# Patient Record
Sex: Female | Born: 1965 | Race: White | Hispanic: No | Smoking: Current every day smoker
Health system: Southern US, Community
[De-identification: ages and names within clinical notes are randomized; demographics above are authoritative.]

## PROBLEM LIST (undated history)

## (undated) ENCOUNTER — Ambulatory Visit: Admission: EM | Payer: Commercial Managed Care - HMO | Source: Home / Self Care

## (undated) DIAGNOSIS — B182 Chronic viral hepatitis C: Secondary | ICD-10-CM

## (undated) DIAGNOSIS — L0390 Cellulitis, unspecified: Secondary | ICD-10-CM

## (undated) DIAGNOSIS — F32A Depression, unspecified: Secondary | ICD-10-CM

## (undated) DIAGNOSIS — B9562 Methicillin resistant Staphylococcus aureus infection as the cause of diseases classified elsewhere: Secondary | ICD-10-CM

## (undated) DIAGNOSIS — F419 Anxiety disorder, unspecified: Secondary | ICD-10-CM

## (undated) DIAGNOSIS — R011 Cardiac murmur, unspecified: Secondary | ICD-10-CM

## (undated) HISTORY — PX: OTHER SURGICAL HISTORY: SHX169

## (undated) HISTORY — DX: Cardiac murmur, unspecified: R01.1

## (undated) HISTORY — DX: Anxiety disorder, unspecified: F41.9

## (undated) HISTORY — DX: Depression, unspecified: F32.A

## (undated) HISTORY — PX: CHOLECYSTECTOMY: SHX55

---

## 2018-04-08 ENCOUNTER — Emergency Department (HOSPITAL_COMMUNITY): Payer: Self-pay

## 2018-04-08 ENCOUNTER — Emergency Department (HOSPITAL_COMMUNITY)
Admission: EM | Admit: 2018-04-08 | Discharge: 2018-04-08 | Disposition: A | Discharge status: Home / Self Care | Payer: Self-pay | Attending: Emergency Medicine | Admitting: Emergency Medicine

## 2018-04-08 ENCOUNTER — Encounter (HOSPITAL_COMMUNITY): Payer: Self-pay | Admitting: Emergency Medicine

## 2018-04-08 ENCOUNTER — Other Ambulatory Visit: Payer: Self-pay

## 2018-04-08 DIAGNOSIS — R1084 Generalized abdominal pain: Secondary | ICD-10-CM | POA: Insufficient documentation

## 2018-04-08 DIAGNOSIS — F17210 Nicotine dependence, cigarettes, uncomplicated: Secondary | ICD-10-CM | POA: Insufficient documentation

## 2018-04-08 HISTORY — DX: Chronic viral hepatitis C: B18.2

## 2018-04-08 HISTORY — DX: Cellulitis, unspecified: L03.90

## 2018-04-08 LAB — URINALYSIS, ROUTINE W REFLEX MICROSCOPIC
Bilirubin Urine: NEGATIVE
Glucose, UA: NEGATIVE mg/dL
Hgb urine dipstick: NEGATIVE
KETONES UR: NEGATIVE mg/dL
Leukocytes,Ua: NEGATIVE
Nitrite: NEGATIVE
Protein, ur: NEGATIVE mg/dL
Specific Gravity, Urine: 1.011 (ref 1.005–1.030)
pH: 6 (ref 5.0–8.0)

## 2018-04-08 LAB — CBC WITH DIFFERENTIAL/PLATELET
Abs Immature Granulocytes: 0.01 10*3/uL (ref 0.00–0.07)
Basophils Absolute: 0 10*3/uL (ref 0.0–0.1)
Basophils Relative: 1 %
Eosinophils Absolute: 0.1 10*3/uL (ref 0.0–0.5)
Eosinophils Relative: 2 %
HCT: 40.9 % (ref 36.0–46.0)
Hemoglobin: 12.7 g/dL (ref 12.0–15.0)
Immature Granulocytes: 0 %
Lymphocytes Relative: 30 %
Lymphs Abs: 1.4 10*3/uL (ref 0.7–4.0)
MCH: 30.2 pg (ref 26.0–34.0)
MCHC: 31.1 g/dL (ref 30.0–36.0)
MCV: 97.1 fL (ref 80.0–100.0)
Monocytes Absolute: 0.4 10*3/uL (ref 0.1–1.0)
Monocytes Relative: 9 %
NRBC: 0 % (ref 0.0–0.2)
Neutro Abs: 2.8 10*3/uL (ref 1.7–7.7)
Neutrophils Relative %: 58 %
Platelets: 251 10*3/uL (ref 150–400)
RBC: 4.21 MIL/uL (ref 3.87–5.11)
RDW: 13 % (ref 11.5–15.5)
WBC: 4.7 10*3/uL (ref 4.0–10.5)

## 2018-04-08 LAB — COMPREHENSIVE METABOLIC PANEL
ALK PHOS: 82 U/L (ref 38–126)
ALT: 24 U/L (ref 0–44)
AST: 21 U/L (ref 15–41)
Albumin: 4.1 g/dL (ref 3.5–5.0)
Anion gap: 8 (ref 5–15)
BUN: 14 mg/dL (ref 6–20)
CO2: 26 mmol/L (ref 22–32)
Calcium: 9.1 mg/dL (ref 8.9–10.3)
Chloride: 105 mmol/L (ref 98–111)
Creatinine, Ser: 0.92 mg/dL (ref 0.44–1.00)
GFR calc Af Amer: >60 mL/min (ref >60)
GFR calc non Af Amer: >60 mL/min (ref >60)
Glucose, Bld: 90 mg/dL (ref 70–99)
Potassium: 4 mmol/L (ref 3.5–5.1)
Sodium: 139 mmol/L (ref 135–145)
TOTAL PROTEIN: 8.3 g/dL — AB (ref 6.5–8.1)
Total Bilirubin: 0.3 mg/dL (ref 0.3–1.2)

## 2018-04-08 LAB — LIPASE, BLOOD: Lipase: 46 U/L (ref 11–51)

## 2018-04-08 LAB — POC URINE PREG, ED: Preg Test, Ur: NEGATIVE

## 2018-04-08 MED ORDER — IOHEXOL 300 MG/ML  SOLN
100.0000 mL | Freq: Once | INTRAMUSCULAR | Status: AC | PRN
  Administered 2018-04-08: 100 mL via INTRAVENOUS

## 2018-04-08 NOTE — ED Triage Notes (Signed)
Pt states that she has a knot in her stomach it has been there for a few months she states that it is moving around.

## 2018-04-08 NOTE — Discharge Instructions (Addendum)
Take your usual prescriptions as previously directed.  Apply moist heat or ice to the area(s) of discomfort, for 15 minutes at a time, several times per day for the next few days.  Do not fall asleep on a heating or ice pack.  Call your regular medical doctor and the GI doctor on Monday to schedule a follow up appointment within the next few weeks.  Return to the Emergency Department immediately if worsening.

## 2018-04-08 NOTE — ED Provider Notes (Signed)
[facility] EMERGENCY DEPARTMENT Provider Note   CSN: [id number] Arrival date & time: 04/08/18  1436    History   Chief Complaint Chief Complaint  Patient presents with  . Abdominal Pain    HPI Alexandra Floyd is a 53 y.o. female.     HPI Pt was seen at 1535.  Per pt, c/o gradual onset and persistence of constant generalized abd "pain" for the past 6 months. Pt states she "feels a lump that moves all around my abd." Pt significant other states "it's like she's pregnant."  Describes the abd pain as "cramping."  Denies any associated symptoms. Denies N/V/D, no injury, no fevers, no back pain, no rash, no CP/SOB, no black or blood in stools, no dysuria/hematuria.       Past Medical History:  Diagnosis Date  . Cellulitis   . Hep C w/ coma, chronic (HCC)     There are no active problems to display for this patient.      OB History   No obstetric history on file.      Home Medications    Prior to Admission medications   Not on File    Family History No family history on file.  Social History Social History   Tobacco Use  . Smoking status: Current Every Day Smoker    Packs/day: 0.50    Types: Cigarettes  . Smokeless tobacco: Never Used  Substance Use Topics  . Alcohol use: Yes    Comment: social  . Drug use: Not on file     Allergies   Lasix [furosemide]   Review of Systems Review of Systems ROS: Statement: All systems negative except as marked or noted in the HPI; Constitutional: Negative for fever and chills. ; ; Eyes: Negative for eye pain, redness and discharge. ; ; ENMT: Negative for ear pain, hoarseness, nasal congestion, sinus pressure and sore throat. ; ; Cardiovascular: Negative for chest pain, palpitations, diaphoresis, dyspnea and peripheral edema. ; ; Respiratory: Negative for cough, wheezing and stridor. ; ; Gastrointestinal: +"knot moving around my abd."  Negative for nausea, vomiting, diarrhea, abdominal pain, blood in stool, hematemesis,  jaundice and rectal bleeding. . ; ; Genitourinary: Negative for dysuria, flank pain and hematuria. ; ; Musculoskeletal: Negative for back pain and neck pain. Negative for swelling and trauma.; ; Skin: Negative for pruritus, rash, abrasions, blisters, bruising and skin lesion.; ; Neuro: Negative for headache, lightheadedness and neck stiffness. Negative for weakness, altered level of consciousness, altered mental status, extremity weakness, paresthesias, involuntary movement, seizure and syncope.       Physical Exam Updated Vital Signs BP 126/90 (BP Location: Left Arm)   Pulse 81   Temp (!) 97.5 F (36.4 C) (Oral)   Resp 16   Ht 5\' 3"  (1.6 m)   Wt 71.2 kg   SpO2 100%   BMI 27.81 kg/m   Physical Exam 1540: Physical examination:  Nursing notes reviewed; Vital signs and O2 SAT reviewed;  Constitutional: Well developed, Well nourished, Well hydrated, In no acute distress; Head:  Normocephalic, atraumatic; Eyes: EOMI, PERRL, No scleral icterus; ENMT: Mouth and pharynx normal, Mucous membranes moist; Neck: Supple, Full range of motion, No lymphadenopathy; Cardiovascular: Regular rate and rhythm, No gallop; Respiratory: Breath sounds clear & equal bilaterally, No wheezes.  Speaking full sentences with ease, Normal respiratory effort/excursion; Chest: Nontender, Movement normal; Abdomen: Soft, Nontender, Nondistended, Normal bowel sounds; Genitourinary: No CVA tenderness; Extremities: Peripheral pulses normal, No tenderness, No edema, No calf edema or asymmetry.; Neuro: AA&Ox3,  Major CN grossly intact.  Speech clear. No gross focal motor or sensory deficits in extremities. Climbs on and off stretcher easily by herself. Gait steady..; Skin: Color normal, Warm, Dry.     ED Treatments / Results  Labs (all labs ordered are listed, but only abnormal results are displayed)   EKG None  Radiology   Procedures Procedures (including critical care time)  Medications Ordered in ED Medications - No  data to display   Initial Impression / Assessment and Plan / ED Course  I have reviewed the triage vital signs and the nursing notes.  Pertinent labs & imaging results that were available during my care of the patient were reviewed by me and considered in my medical decision making (see chart for details).     MDM Reviewed: nursing note and vitals Interpretation: labs, x-ray and CT scan    Results for orders placed or performed during the hospital encounter of 04/08/18  Comprehensive metabolic panel  Result Value Ref Range   Sodium 139 135 - 145 mmol/L   Potassium 4.0 3.5 - 5.1 mmol/L   Chloride 105 98 - 111 mmol/L   CO2 26 22 - 32 mmol/L   Glucose, Bld 90 70 - 99 mg/dL   BUN 14 6 - 20 mg/dL   Creatinine, Ser 1.95 0.44 - 1.00 mg/dL   Calcium 9.1 8.9 - 09.3 mg/dL   Total Protein 8.3 (H) 6.5 - 8.1 g/dL   Albumin 4.1 3.5 - 5.0 g/dL   AST 21 15 - 41 U/L   ALT 24 0 - 44 U/L   Alkaline Phosphatase 82 38 - 126 U/L   Total Bilirubin 0.3 0.3 - 1.2 mg/dL   GFR calc non Af Amer >60 >60 mL/min   GFR calc Af Amer >60 >60 mL/min   Anion gap 8 5 - 15  Lipase, blood  Result Value Ref Range   Lipase 46 11 - 51 U/L  CBC with Differential  Result Value Ref Range   WBC 4.7 4.0 - 10.5 K/uL   RBC 4.21 3.87 - 5.11 MIL/uL   Hemoglobin 12.7 12.0 - 15.0 g/dL   HCT 26.7 12.4 - 58.0 %   MCV 97.1 80.0 - 100.0 fL   MCH 30.2 26.0 - 34.0 pg   MCHC 31.1 30.0 - 36.0 g/dL   RDW 99.8 33.8 - 25.0 %   Platelets 251 150 - 400 K/uL   nRBC 0.0 0.0 - 0.2 %   Neutrophils Relative % 58 %   Neutro Abs 2.8 1.7 - 7.7 K/uL   Lymphocytes Relative 30 %   Lymphs Abs 1.4 0.7 - 4.0 K/uL   Monocytes Relative 9 %   Monocytes Absolute 0.4 0.1 - 1.0 K/uL   Eosinophils Relative 2 %   Eosinophils Absolute 0.1 0.0 - 0.5 K/uL   Basophils Relative 1 %   Basophils Absolute 0.0 0.0 - 0.1 K/uL   Immature Granulocytes 0 %   Abs Immature Granulocytes 0.01 0.00 - 0.07 K/uL  Urinalysis, Routine w reflex microscopic    Result Value Ref Range   Color, Urine YELLOW YELLOW   APPearance CLEAR CLEAR   Specific Gravity, Urine 1.011 1.005 - 1.030   pH 6.0 5.0 - 8.0   Glucose, UA NEGATIVE NEGATIVE mg/dL   Hgb urine dipstick NEGATIVE NEGATIVE   Bilirubin Urine NEGATIVE NEGATIVE   Ketones, ur NEGATIVE NEGATIVE mg/dL   Protein, ur NEGATIVE NEGATIVE mg/dL   Nitrite NEGATIVE NEGATIVE   Leukocytes,Ua NEGATIVE NEGATIVE  POC Urine  Pregnancy, ED (not at [facility])  Result Value Ref Range   Preg Test, Ur NEGATIVE NEGATIVE   Dg Chest 2 View Result Date: 04/08/2018 CLINICAL DATA:  Abdominal pain. EXAM: CHEST - 2 VIEW COMPARISON:  None. FINDINGS: The heart size and mediastinal contours are within normal limits. Both lungs are clear. The visualized skeletal structures are unremarkable. IMPRESSION: Negative two view chest x-ray Electronically Signed   By: Marin Roberts M.D.   On: 04/08/2018 19:31   Ct Abdomen Pelvis W Contrast Result Date: 04/08/2018 CLINICAL DATA:  Unspecified abdominal pain. History of cellulitis and hepatitis C. EXAM: CT ABDOMEN AND PELVIS WITH CONTRAST TECHNIQUE: Multidetector CT imaging of the abdomen and pelvis was performed using the standard protocol following bolus administration of intravenous contrast. CONTRAST:  OMNIPAQUE IOHEXOL 300 MG/ML  SOLN COMPARISON:  None. FINDINGS: Lower chest: Lung bases are clear. Hepatobiliary: No focal liver abnormality is seen. Status post cholecystectomy. No biliary dilatation. Pancreas: Unremarkable. No pancreatic ductal dilatation or surrounding inflammatory changes. Spleen: Normal in size without focal abnormality. Adrenals/Urinary Tract: Adrenal glands are unremarkable. Kidneys are normal, without renal calculi, focal lesion, or hydronephrosis. Bladder is unremarkable. Stomach/Bowel: Stomach, small bowel, and colon are mostly decompressed with scattered stool in the colon. Colonic diverticula without evidence of diverticulitis. Appendix is not identified.  Vascular/Lymphatic: No significant vascular findings are present. No enlarged abdominal or pelvic lymph nodes. Varices in the right groin region. Reproductive: Uterus and bilateral adnexa are unremarkable. Other: No abdominal wall hernia or abnormality. No abdominopelvic ascites. Musculoskeletal: Degenerative changes in the lumbar spine. No destructive bone lesions. IMPRESSION: No acute process demonstrated in the abdomen or pelvis. No evidence of bowel obstruction or inflammation. Electronically Signed   By: Burman Nieves M.D.   On: 04/08/2018 19:36     2000:  Workup is reassuring. Pt and her visitor are both insistent "there is a lump" and "it's moving around." Attempted to reassure again regarding CT scan results. Pt states she is ready to go home now. Dx and testing d/w pt and family.  Questions answered.  Verb understanding, agreeable to d/c home with outpt f/u.      Final Clinical Impressions(s) / ED Diagnoses   Final diagnoses:  None    ED Discharge Orders    None       Samuel Jester, DO 04/12/18 2213

## 2018-04-15 ENCOUNTER — Encounter (HOSPITAL_COMMUNITY): Payer: Self-pay | Admitting: Emergency Medicine

## 2018-04-15 ENCOUNTER — Other Ambulatory Visit: Payer: Self-pay

## 2018-04-15 ENCOUNTER — Emergency Department (HOSPITAL_COMMUNITY)
Admission: EM | Admit: 2018-04-15 | Discharge: 2018-04-15 | Disposition: A | Discharge status: Home / Self Care | Payer: Self-pay | Attending: Emergency Medicine | Admitting: Emergency Medicine

## 2018-04-15 ENCOUNTER — Emergency Department (HOSPITAL_COMMUNITY): Payer: Self-pay

## 2018-04-15 DIAGNOSIS — F17210 Nicotine dependence, cigarettes, uncomplicated: Secondary | ICD-10-CM | POA: Insufficient documentation

## 2018-04-15 DIAGNOSIS — M62838 Other muscle spasm: Secondary | ICD-10-CM | POA: Insufficient documentation

## 2018-04-15 DIAGNOSIS — Z79899 Other long term (current) drug therapy: Secondary | ICD-10-CM | POA: Insufficient documentation

## 2018-04-15 DIAGNOSIS — M5412 Radiculopathy, cervical region: Secondary | ICD-10-CM | POA: Insufficient documentation

## 2018-04-15 MED ORDER — NAPROXEN 250 MG PO TABS
500.0000 mg | ORAL_TABLET | Freq: Once | ORAL | Status: AC
  Administered 2018-04-15: 500 mg via ORAL
  Filled 2018-04-15: qty 2

## 2018-04-15 MED ORDER — HYDROCODONE-ACETAMINOPHEN 5-325 MG PO TABS
1.0000 | ORAL_TABLET | Freq: Once | ORAL | Status: AC
  Administered 2018-04-15: 1 via ORAL
  Filled 2018-04-15: qty 1

## 2018-04-15 MED ORDER — CYCLOBENZAPRINE HCL 5 MG PO TABS: 5.0000 mg | ORAL_TABLET | Freq: Three times a day (TID) | ORAL | 0 refills | Status: DC | PRN

## 2018-04-15 MED ORDER — CYCLOBENZAPRINE HCL 10 MG PO TABS
10.0000 mg | ORAL_TABLET | Freq: Once | ORAL | Status: AC
  Administered 2018-04-15: 10 mg via ORAL
  Filled 2018-04-15: qty 1

## 2018-04-15 MED ORDER — NAPROXEN 500 MG PO TABS: 500.0000 mg | ORAL_TABLET | Freq: Two times a day (BID) | ORAL | 0 refills | Status: DC

## 2018-04-15 MED ORDER — HYDROCODONE-ACETAMINOPHEN 5-325 MG PO TABS: 1.0000 | ORAL_TABLET | ORAL | 0 refills | Status: DC | PRN

## 2018-04-15 NOTE — ED Notes (Signed)
Patient left without signing.  
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
## 2018-04-15 NOTE — Discharge Instructions (Addendum)
Take your next dose of prednisone tomorrow morning.  Use the the other medicines as directed.  Do not drive within 4 hours of taking hydrocodone as this will make you drowsy.  Avoid lifting,  Bending or any activity that worsens your pain.  Apply heat to your neck for 15 minutes several dimes daily.  You should get rechecked if your symptoms are not better over the next 5 days,  Or you develop increased pain,  Weakness in your arms or hands, as these are symptoms of a potentially worsening problem.
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
## 2018-04-17 ENCOUNTER — Telehealth: Payer: Self-pay | Admitting: Physician Assistant

## 2018-04-24 ENCOUNTER — Ambulatory Visit: Payer: Self-pay | Admitting: Physician Assistant

## 2018-05-12 ENCOUNTER — Emergency Department (HOSPITAL_COMMUNITY)
Admission: EM | Admit: 2018-05-12 | Discharge: 2018-05-12 | Disposition: A | Discharge status: Home / Self Care | Payer: Self-pay | Attending: Emergency Medicine | Admitting: Emergency Medicine

## 2018-05-12 ENCOUNTER — Encounter (HOSPITAL_COMMUNITY): Payer: Self-pay | Admitting: Emergency Medicine

## 2018-05-12 ENCOUNTER — Other Ambulatory Visit: Payer: Self-pay

## 2018-05-12 DIAGNOSIS — Y929 Unspecified place or not applicable: Secondary | ICD-10-CM | POA: Insufficient documentation

## 2018-05-12 DIAGNOSIS — Y999 Unspecified external cause status: Secondary | ICD-10-CM | POA: Insufficient documentation

## 2018-05-12 DIAGNOSIS — W25XXXA Contact with sharp glass, initial encounter: Secondary | ICD-10-CM | POA: Insufficient documentation

## 2018-05-12 DIAGNOSIS — S61211A Laceration without foreign body of left index finger without damage to nail, initial encounter: Secondary | ICD-10-CM | POA: Insufficient documentation

## 2018-05-12 DIAGNOSIS — Y9389 Activity, other specified: Secondary | ICD-10-CM | POA: Insufficient documentation

## 2018-05-12 DIAGNOSIS — Z23 Encounter for immunization: Secondary | ICD-10-CM | POA: Insufficient documentation

## 2018-05-12 DIAGNOSIS — F17210 Nicotine dependence, cigarettes, uncomplicated: Secondary | ICD-10-CM | POA: Insufficient documentation

## 2018-05-12 DIAGNOSIS — Z79899 Other long term (current) drug therapy: Secondary | ICD-10-CM | POA: Insufficient documentation

## 2018-05-12 MED ORDER — TETANUS-DIPHTH-ACELL PERTUSSIS 5-2.5-18.5 LF-MCG/0.5 IM SUSP
0.5000 mL | Freq: Once | INTRAMUSCULAR | Status: AC
  Administered 2018-05-12: 19:00:00 0.5 mL via INTRAMUSCULAR
  Filled 2018-05-12: qty 0.5

## 2018-05-12 MED ORDER — LIDOCAINE HCL (PF) 2 % IJ SOLN
INTRAMUSCULAR | Status: AC
  Administered 2018-05-12: 10 mL
  Filled 2018-05-12: qty 20

## 2018-05-12 MED ORDER — POVIDONE-IODINE 10 % EX SOLN
CUTANEOUS | Status: AC
  Filled 2018-05-12: qty 15

## 2018-05-12 MED ORDER — LIDOCAINE HCL (PF) 1 % IJ SOLN: 10.0000 mL | Freq: Once | INTRAMUSCULAR | Status: DC

## 2018-05-12 NOTE — ED Notes (Signed)
PT left without getting d/c papers but d/c instructions were reviewed with pt.
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
## 2018-05-12 NOTE — ED Provider Notes (Signed)
[facility] EMERGENCY DEPARTMENT Provider Note   CSN: [id number] Arrival date & time: 05/12/18  1818    History   Chief Complaint Chief Complaint  Patient presents with  . Laceration    HPI Alexandra Floyd is a 53 y.o. female.  She is complaining of a laceration to her left index finger that occurred just prior to arrival.  She said she was picking up a broken bottle and it cut her hand.  She is feeling some numbness in that finger.  No weakness.  No foreign body sensation.  Unknown when her last tetanus was but she thinks is greater than 5 years.  No other injuries or complaints.     The history is provided by the patient.  Laceration  Location:  Finger Finger laceration location:  L index finger Length:  3 Quality: jagged   Bleeding: controlled   Laceration mechanism:  Broken glass Pain details:    Quality:  Throbbing   Severity:  Moderate   Timing:  Constant   Progression:  Unchanged Foreign body present:  No foreign bodies Relieved by:  Pressure Worsened by:  Nothing Ineffective treatments:  None tried Tetanus status:  Out of date Associated symptoms: numbness   Associated symptoms: no fever and no focal weakness     Past Medical History:  Diagnosis Date  . Cellulitis   . Hep C w/ coma, chronic (HCC)     There are no active problems to display for this patient.   Past Surgical History:  Procedure Laterality Date  . arm sx    . CHOLECYSTECTOMY       OB History   No obstetric history on file.      Home Medications    Prior to Admission medications   Medication Sig Start Date End Date Taking? Authorizing Provider  cyclobenzaprine (FLEXERIL) 5 MG tablet Take 1 tablet (5 mg total) by mouth 3 (three) times daily as needed for muscle spasms. 04/15/18   Burgess Amor, PA-C  HYDROcodone-acetaminophen (NORCO/VICODIN) 5-325 MG tablet Take 1 tablet by mouth every 4 (four) hours as needed. 04/15/18   Burgess Amor, PA-C  naproxen (NAPROSYN) 500 MG tablet Take 1  tablet (500 mg total) by mouth 2 (two) times daily. 04/15/18   Burgess Amor, PA-C    Family History History reviewed. No pertinent family history.  Social History Social History   Tobacco Use  . Smoking status: Current Every Day Smoker    Packs/day: 0.50    Types: Cigarettes  . Smokeless tobacco: Never Used  Substance Use Topics  . Alcohol use: Yes    Comment: social  . Drug use: Never     Allergies   Lasix [furosemide]   Review of Systems Review of Systems  Constitutional: Negative for fever.  Respiratory: Negative for shortness of breath.   Cardiovascular: Negative for chest pain.  Gastrointestinal: Negative for abdominal pain.  Skin: Positive for wound.  Neurological: Negative for focal weakness.     Physical Exam Updated Vital Signs BP (!) 148/96 (BP Location: Right Arm)   Pulse (!) 120   Temp 98.9 F (37.2 C) (Oral)   Resp (!) 21   Ht 5\' 3"  (1.6 m)   Wt 68 kg   SpO2 98%   BMI 26.56 kg/m   Physical Exam Vitals signs and nursing note reviewed.  Constitutional:      Appearance: She is well-developed.  HENT:     Head: Normocephalic and atraumatic.  Eyes:     Conjunctiva/sclera: Conjunctivae normal.  Neck:     Musculoskeletal: Neck supple.  Musculoskeletal: Normal range of motion.        General: Signs of injury present.     Comments: She has small contractures in her right upper extremity from an injury with surgical repair.  Left hand she has a approximate 3 cm laceration over her proximal left index finger lateral aspect.  FDS FDP and EDC intact.  Sensation subjectively decreased but not insensate over that digit.  Cap refill brisk.  No other lacerations noted.  No foreign body identified.  Skin:    General: Skin is warm and dry.     Capillary Refill: Capillary refill takes less than 2 seconds.  Neurological:     Mental Status: She is alert.     GCS: GCS eye subscore is 4. GCS verbal subscore is 5. GCS motor subscore is 6.     Gait: Gait normal.       ED Treatments / Results  Labs (all labs ordered are listed, but only abnormal results are displayed) Labs Reviewed - No data to display  EKG None  Radiology No results found.  Procedures .Marland Kitchen.Laceration Repair Date/Time: 05/12/2018 6:48 PM Performed by: Terrilee FilesButler, Cyani Kallstrom C, MD Authorized by: Terrilee FilesButler, Lorris Carducci C, MD   Consent:    Consent obtained:  Verbal   Consent given by:  Patient   Risks discussed:  Infection, pain, poor cosmetic result, poor wound healing and retained foreign body   Alternatives discussed:  No treatment and delayed treatment Anesthesia (see MAR for exact dosages):    Anesthesia method:  Local infiltration   Local anesthetic:  Lidocaine 1% w/o epi Laceration details:    Location:  Finger   Finger location:  L index finger   Length (cm):  3 Repair type:    Repair type:  Simple Pre-procedure details:    Preparation:  Patient was prepped and draped in usual sterile fashion Exploration:    Hemostasis achieved with:  Direct pressure   Wound exploration: wound explored through full range of motion     Wound extent: nerve damage (probable)     Wound extent: no tendon damage noted     Contaminated: no   Treatment:    Area cleansed with:  Saline   Amount of cleaning:  Standard   Irrigation solution:  Sterile saline Skin repair:    Repair method:  Sutures   Suture size:  4-0   Suture material:  Nylon   Suture technique:  Simple interrupted   Number of sutures:  4 Approximation:    Approximation:  Close Post-procedure details:    Dressing:  Non-adherent dressing and bulky dressing   Patient tolerance of procedure:  Tolerated well, no immediate complications Comments:     Likely has some element of nerve injury with subjective decrease sensation.  No obvious foreign body noted in wound.  There was some brisk bleeding that was controlled by pressure.  Wound was dressed with a bulky dressing to help with hemostasis.  I reviewed with the patient the possible  nerve injury and that we would provide her with a number for hand surgery for follow-up if she did not have complete return of function.   (including critical care time)  Medications Ordered in ED Medications  lidocaine (PF) (XYLOCAINE) 1 % injection 10 mL (has no administration in time range)  Tdap (BOOSTRIX) injection 0.5 mL (has no administration in time range)     Initial Impression / Assessment and Plan / ED Course  I  have reviewed the triage vital signs and the nursing notes.  Pertinent labs & imaging results that were available during my care of the patient were reviewed by me and considered in my medical decision making (see chart for details).        Final Clinical Impressions(s) / ED Diagnoses   Final diagnoses:  Laceration of left index finger without foreign body without damage to nail, initial encounter    ED Discharge Orders    None       Terrilee Files, MD 05/16/18 [phone]

## 2018-05-12 NOTE — Discharge Instructions (Addendum)
You are seen in the emergency department for a laceration to your left index finger.  You had decreased feeling in that finger prior to repair it is possible that the nerve was injured.  Your sutures will need to come out in 10 to 14 days.  Please leave the dressing on for at least 24 hours.  After that soap and water.  If the function in your finger is not completely back to normal please contact the hand surgeon at the number we have provided for follow-up.

## 2018-05-12 NOTE — ED Notes (Signed)
Dressing (kerlix) and non-adherent applied to laceration repair to left hand.

## 2018-07-09 ENCOUNTER — Encounter (HOSPITAL_COMMUNITY): Payer: Self-pay | Admitting: Emergency Medicine

## 2018-07-09 ENCOUNTER — Emergency Department (HOSPITAL_COMMUNITY)
Admission: EM | Admit: 2018-07-09 | Discharge: 2018-07-09 | Disposition: A | Discharge status: Home / Self Care | Payer: Self-pay | Attending: Emergency Medicine | Admitting: Emergency Medicine

## 2018-07-09 ENCOUNTER — Other Ambulatory Visit: Payer: Self-pay

## 2018-07-09 DIAGNOSIS — Z5321 Procedure and treatment not carried out due to patient leaving prior to being seen by health care provider: Secondary | ICD-10-CM | POA: Insufficient documentation

## 2018-07-09 DIAGNOSIS — R109 Unspecified abdominal pain: Secondary | ICD-10-CM | POA: Insufficient documentation

## 2018-07-09 MED ORDER — SODIUM CHLORIDE 0.9% FLUSH: 3.0000 mL | Freq: Once | INTRAVENOUS | Status: DC

## 2018-07-09 NOTE — ED Notes (Signed)
Called no answer

## 2018-07-09 NOTE — ED Triage Notes (Signed)
Pt C/O generalized abdominal pain with swelling after smoking crack and drinking alcohol last night. Denies N/V.

## 2018-09-20 ENCOUNTER — Encounter (HOSPITAL_COMMUNITY): Payer: Self-pay | Admitting: Emergency Medicine

## 2018-09-20 ENCOUNTER — Emergency Department (HOSPITAL_COMMUNITY)
Admission: EM | Admit: 2018-09-20 | Discharge: 2018-09-20 | Disposition: A | Discharge status: Home / Self Care | Payer: Self-pay | Attending: Emergency Medicine | Admitting: Emergency Medicine

## 2018-09-20 ENCOUNTER — Other Ambulatory Visit: Payer: Self-pay

## 2018-09-20 ENCOUNTER — Emergency Department (HOSPITAL_COMMUNITY): Payer: Self-pay

## 2018-09-20 DIAGNOSIS — R05 Cough: Secondary | ICD-10-CM | POA: Insufficient documentation

## 2018-09-20 DIAGNOSIS — Y904 Blood alcohol level of 80-99 mg/100 ml: Secondary | ICD-10-CM | POA: Insufficient documentation

## 2018-09-20 DIAGNOSIS — J189 Pneumonia, unspecified organism: Secondary | ICD-10-CM

## 2018-09-20 DIAGNOSIS — J188 Other pneumonia, unspecified organism: Secondary | ICD-10-CM | POA: Insufficient documentation

## 2018-09-20 DIAGNOSIS — F10929 Alcohol use, unspecified with intoxication, unspecified: Secondary | ICD-10-CM | POA: Insufficient documentation

## 2018-09-20 DIAGNOSIS — F17210 Nicotine dependence, cigarettes, uncomplicated: Secondary | ICD-10-CM | POA: Insufficient documentation

## 2018-09-20 DIAGNOSIS — F1490 Cocaine use, unspecified, uncomplicated: Secondary | ICD-10-CM | POA: Insufficient documentation

## 2018-09-20 LAB — CBC WITH DIFFERENTIAL/PLATELET
Abs Immature Granulocytes: 0.02 10*3/uL (ref 0.00–0.07)
Basophils Absolute: 0 10*3/uL (ref 0.0–0.1)
Basophils Relative: 1 %
Eosinophils Absolute: 0 10*3/uL (ref 0.0–0.5)
Eosinophils Relative: 1 %
HCT: 41.9 % (ref 36.0–46.0)
Hemoglobin: 13.8 g/dL (ref 12.0–15.0)
Immature Granulocytes: 0 %
Lymphocytes Relative: 20 %
Lymphs Abs: 1.1 10*3/uL (ref 0.7–4.0)
MCH: 31.4 pg (ref 26.0–34.0)
MCHC: 32.9 g/dL (ref 30.0–36.0)
MCV: 95.2 fL (ref 80.0–100.0)
Monocytes Absolute: 0.6 10*3/uL (ref 0.1–1.0)
Monocytes Relative: 12 %
Neutro Abs: 3.6 10*3/uL (ref 1.7–7.7)
Neutrophils Relative %: 66 %
Platelets: 246 10*3/uL (ref 150–400)
RBC: 4.4 MIL/uL (ref 3.87–5.11)
RDW: 12.9 % (ref 11.5–15.5)
WBC: 5.4 10*3/uL (ref 4.0–10.5)
nRBC: 0 % (ref 0.0–0.2)

## 2018-09-20 LAB — COMPREHENSIVE METABOLIC PANEL
ALT: 44 U/L (ref 0–44)
AST: 38 U/L (ref 15–41)
Albumin: 4.3 g/dL (ref 3.5–5.0)
Alkaline Phosphatase: 81 U/L (ref 38–126)
Anion gap: 7 (ref 5–15)
BUN: 15 mg/dL (ref 6–20)
CO2: 28 mmol/L (ref 22–32)
Calcium: 9.1 mg/dL (ref 8.9–10.3)
Chloride: 107 mmol/L (ref 98–111)
Creatinine, Ser: 0.65 mg/dL (ref 0.44–1.00)
GFR calc Af Amer: >60 mL/min (ref >60)
GFR calc non Af Amer: >60 mL/min (ref >60)
Glucose, Bld: 105 mg/dL — ABNORMAL HIGH (ref 70–99)
Potassium: 4.4 mmol/L (ref 3.5–5.1)
Sodium: 142 mmol/L (ref 135–145)
Total Bilirubin: 0.5 mg/dL (ref 0.3–1.2)
Total Protein: 8.7 g/dL — ABNORMAL HIGH (ref 6.5–8.1)

## 2018-09-20 LAB — ETHANOL: Alcohol, Ethyl (B): 92 mg/dL — ABNORMAL HIGH (ref <10)

## 2018-09-20 LAB — CK: Total CK: 268 U/L — ABNORMAL HIGH (ref 38–234)

## 2018-09-20 MED ORDER — ACETAMINOPHEN 325 MG PO TABS
650.0000 mg | ORAL_TABLET | Freq: Once | ORAL | Status: AC
  Administered 2018-09-20: 10:00:00 650 mg via ORAL
  Filled 2018-09-20: qty 2

## 2018-09-20 MED ORDER — ONDANSETRON HCL 4 MG/2ML IJ SOLN: 4.0000 mg | Freq: Once | INTRAMUSCULAR | Status: DC

## 2018-09-20 MED ORDER — AZITHROMYCIN 250 MG PO TABS
500.0000 mg | ORAL_TABLET | Freq: Once | ORAL | Status: AC
  Administered 2018-09-20: 12:00:00 500 mg via ORAL
  Filled 2018-09-20: qty 2

## 2018-09-20 MED ORDER — AMOXICILLIN-POT CLAVULANATE 875-125 MG PO TABS
1.0000 | ORAL_TABLET | Freq: Once | ORAL | Status: AC
  Administered 2018-09-20: 12:00:00 1 via ORAL
  Filled 2018-09-20: qty 1

## 2018-09-20 MED ORDER — ONDANSETRON 8 MG PO TBDP
8.0000 mg | ORAL_TABLET | Freq: Once | ORAL | Status: AC
  Administered 2018-09-20: 10:00:00 8 mg via ORAL
  Filled 2018-09-20: qty 1

## 2018-09-20 MED ORDER — AMOXICILLIN-POT CLAVULANATE 875-125 MG PO TABS: 1.0000 | ORAL_TABLET | Freq: Two times a day (BID) | ORAL | 0 refills | Status: DC

## 2018-09-20 MED ORDER — AZITHROMYCIN 250 MG PO TABS: 250.0000 mg | ORAL_TABLET | Freq: Every day | ORAL | 0 refills | Status: AC

## 2018-09-20 NOTE — ED Provider Notes (Signed)
[facility] EMERGENCY DEPARTMENT Provider Note   CSN: [id number] Arrival date & time: 09/20/18  0915     History   Chief Complaint Chief Complaint  Patient presents with  . Alcohol Intoxication    HPI Alexandra Floyd is a 53 y.o. female.     HPI Patient presents with concern ofHeadache, cough, generalized discomfort.  History of hepatitis C, but states that she is otherwise well. She drinks alcohol occasionally, uses crack cocaine occasionally, but smokes cigarettes regularly. She notes that yesterday she drank more alcohol than usual and use crack cocaine. Though she had a headache and cough prior to this, possibly for the past day, symptoms are worse since that time. She recalls a sleeping last night on a sidewalk. No other focal pain, though she does describe some generalized discomfort and possible chest discomfort, seemingly with coughing. No fever.  Past Medical History:  Diagnosis Date  . Cellulitis   . Hep C w/ coma, chronic (HCC)     There are no active problems to display for this patient.   Past Surgical History:  Procedure Laterality Date  . arm sx    . CHOLECYSTECTOMY       OB History   No obstetric history on file.      Home Medications    Prior to Admission medications   Not on File    Family History No family history on file.  Social History Social History   Tobacco Use  . Smoking status: Current Every Day Smoker    Packs/day: 0.50    Types: Cigarettes  . Smokeless tobacco: Never Used  Substance Use Topics  . Alcohol use: Yes    Comment: social  . Drug use: Yes    Types: Cocaine    Comment: Crack last used 8/20(am)     Allergies   Lasix [furosemide]   Review of Systems Review of Systems  Constitutional:       Per HPI, otherwise negative  HENT:       Per HPI, otherwise negative  Respiratory:       Per HPI, otherwise negative  Cardiovascular:       Per HPI, otherwise negative  Gastrointestinal: Negative for  vomiting.  Endocrine:       Negative aside from HPI  Genitourinary:       Neg aside from HPI   Musculoskeletal:       Per HPI, otherwise negative  Skin: Negative.   Neurological: Negative for syncope.     Physical Exam Updated Vital Signs BP (!) 105/58   Pulse 68   Temp 97.7 F (36.5 C) (Oral)   Resp 15   Wt 68 kg   SpO2 98%   BMI 26.56 kg/m   Physical Exam Vitals signs and nursing note reviewed.  Constitutional:      General: She is not in acute distress.    Appearance: She is well-developed.  HENT:     Head: Normocephalic and atraumatic.  Eyes:     Conjunctiva/sclera: Conjunctivae normal.  Cardiovascular:     Rate and Rhythm: Normal rate and regular rhythm.  Pulmonary:     Effort: Pulmonary effort is normal. No respiratory distress.     Breath sounds: Normal breath sounds. No stridor.  Abdominal:     General: There is no distension.  Skin:    General: Skin is warm and dry.  Neurological:     Mental Status: She is alert and oriented to person, place, and time.  Cranial Nerves: No cranial nerve deficit.      ED Treatments / Results  Labs (all labs ordered are listed, but only abnormal results are displayed) Labs Reviewed  COMPREHENSIVE METABOLIC PANEL - Abnormal; Notable for the following components:      Result Value   Glucose, Bld 105 (*)    Total Protein 8.7 (*)    All other components within normal limits  ETHANOL - Abnormal; Notable for the following components:   Alcohol, Ethyl (B) 92 (*)    All other components within normal limits  CK - Abnormal; Notable for the following components:   Total CK 268 (*)    All other components within normal limits  CBC WITH DIFFERENTIAL/PLATELET    EKG EKG Interpretation  Date/Time:  Thursday September 20 2018 09:30:37 EDT Ventricular Rate:  81 PR Interval:    QRS Duration: 109 QT Interval:  393 QTC Calculation: 457 R Axis:   65 Text Interpretation:  Sinus rhythm Artifact Abnormal ECG Confirmed by  Gerhard MunchLockwood, Geraldina Parrott [phone]([id number]) on 09/20/2018 10:22:04 AM   Radiology Dg Chest Port 1 View  Result Date: 09/20/2018 CLINICAL DATA:  Cough, smoking EXAM: PORTABLE CHEST 1 VIEW COMPARISON:  04/08/2018 FINDINGS: Cardiomediastinal contours are within normal limits. Pulmonary vasculature is nondilated. Streaky opacity in the medial aspect of the right lower lobe. No pleural effusion or pneumothorax. IMPRESSION: Streaky opacity in the medial right lower lobe which may reflect atelectasis and/or pneumonia in the appropriate clinical setting. Electronically Signed   By: Duanne GuessNicholas  Plundo M.D.   On: 09/20/2018 10:52    Procedures Procedures (including critical care time)  Medications Ordered in ED Medications  acetaminophen (TYLENOL) tablet 650 mg (650 mg Oral Given 09/20/18 1017)  ondansetron (ZOFRAN-ODT) disintegrating tablet 8 mg (8 mg Oral Given 09/20/18 1017)     Initial Impression / Assessment and Plan / ED Course  I have reviewed the triage vital signs and the nursing notes.  Pertinent labs & imaging results that were available during my care of the patient were reviewed by me and considered in my medical decision making (see chart for details).        11:36 AM On repeat exam the patient states that she feels somewhat better.  I reviewed discussed all findings including generally reassuring labs aside from evidence for ongoing alcohol use. Patient found to have likely pneumonia and given her history of smoking, cough, she will receive initial therapy here. CK minimally elevated, but the patient is tolerant of oral intake. With no evidence for bacteremia, sepsis, no evidence for complicated withdrawal, the patient is appropriate for discharge with outpatient follow-up.  Final Clinical Impressions(s) / ED Diagnoses   Final diagnoses:  Community acquired pneumonia of right lower lobe of lung [facility](HCC)    ED Discharge Orders         Ordered    azithromycin (ZITHROMAX) 250 MG tablet  Daily      09/20/18 1138    amoxicillin-clavulanate (AUGMENTIN) 875-125 MG tablet  Every 12 hours     09/20/18 1138           Gerhard MunchLockwood, Maximo Spratling, MD 09/20/18 1138

## 2018-09-20 NOTE — ED Triage Notes (Signed)
Pt reports she was drinking alcohol last night heavily with boyfriend. Had argument as they were driving around. Pt reports she woke up on sidewalk outside hair salon this am. Pt is nauseated,aching and had visibly soiled herself with urine. Alert and oriented at this time.

## 2018-09-20 NOTE — Discharge Instructions (Addendum)
As discussed, you have been diagnosed with pneumonia. It is important to take all medication as directed and schedule follow-up with your physician.  Return here for concerning changes in your condition.

## 2018-11-24 ENCOUNTER — Emergency Department (HOSPITAL_COMMUNITY)
Admission: EM | Admit: 2018-11-24 | Discharge: 2018-11-24 | Disposition: A | Discharge status: Home / Self Care | Payer: Self-pay | Attending: Emergency Medicine | Admitting: Emergency Medicine

## 2018-11-24 ENCOUNTER — Encounter (HOSPITAL_COMMUNITY): Payer: Self-pay | Admitting: Emergency Medicine

## 2018-11-24 DIAGNOSIS — F17210 Nicotine dependence, cigarettes, uncomplicated: Secondary | ICD-10-CM | POA: Insufficient documentation

## 2018-11-24 DIAGNOSIS — Z79899 Other long term (current) drug therapy: Secondary | ICD-10-CM | POA: Insufficient documentation

## 2018-11-24 DIAGNOSIS — K0889 Other specified disorders of teeth and supporting structures: Secondary | ICD-10-CM | POA: Insufficient documentation

## 2018-11-24 MED ORDER — AMOXICILLIN 250 MG PO CAPS
500.0000 mg | ORAL_CAPSULE | Freq: Once | ORAL | Status: AC
  Administered 2018-11-24: 12:00:00 500 mg via ORAL
  Filled 2018-11-24: qty 2

## 2018-11-24 MED ORDER — NAPROXEN 500 MG PO TABS: 500.0000 mg | ORAL_TABLET | Freq: Two times a day (BID) | ORAL | 0 refills | Status: DC

## 2018-11-24 MED ORDER — NAPROXEN 250 MG PO TABS
500.0000 mg | ORAL_TABLET | Freq: Once | ORAL | Status: AC
  Administered 2018-11-24: 500 mg via ORAL
  Filled 2018-11-24: qty 2

## 2018-11-24 MED ORDER — AMOXICILLIN 500 MG PO CAPS: 500.0000 mg | ORAL_CAPSULE | Freq: Three times a day (TID) | ORAL | 0 refills | Status: DC

## 2018-11-24 NOTE — ED Provider Notes (Signed)
[facility] EMERGENCY DEPARTMENT Provider Note   CSN: [id number] Arrival date & time: 11/24/18  2440     History   Chief Complaint Chief Complaint  Patient presents with  . Dental Pain    HPI Alexandra Floyd is a 53 y.o. female.     Patient is a 53 year old female with past medical history of cellulitis, hepatitis C presenting to the emergency department for left upper dental pain.  Reports that this started about 2 weeks ago.  Also reports that he she had an infection in her toe which was treated with doxycycline that she finished about 5 days ago.  She denies any fever, trouble breathing or swallowing, drainage.  She is still able to eat and drink normally.  Patient reports she has significant anxiety and has been off of her anxiety medications for the last 6 months because [place].  Reports she has not had a primary care doctor follow-up.  Patient has been seen in the emergency department multiple times since March of this year and has been referred several times to different doctors and clinics with no effort follow-up.     Past Medical History:  Diagnosis Date  . Cellulitis   . Hep C w/ coma, chronic (HCC)     There are no active problems to display for this patient.   Past Surgical History:  Procedure Laterality Date  . arm sx    . CHOLECYSTECTOMY       OB History   No obstetric history on file.      Home Medications    Prior to Admission medications   Medication Sig Start Date End Date Taking? Authorizing Provider  amoxicillin (AMOXIL) 500 MG capsule Take 1 capsule (500 mg total) by mouth 3 (three) times daily. 11/24/18   Ronnie Doss A, PA-C  amoxicillin-clavulanate (AUGMENTIN) 875-125 MG tablet Take 1 tablet by mouth every 12 (twelve) hours. 09/20/18   Gerhard Munch, MD  naproxen (NAPROSYN) 500 MG tablet Take 1 tablet (500 mg total) by mouth 2 (two) times daily. 11/24/18   Arlyn Dunning PA-C    Family History  History reviewed. No pertinent family history.  Social History Social History   Tobacco Use  . Smoking status: Current Every Day Smoker    Packs/day: 0.50    Types: Cigarettes  . Smokeless tobacco: Never Used  Substance Use Topics  . Alcohol use: Yes    Comment: social  . Drug use: Yes    Types: Cocaine    Comment: Crack      Allergies   Lasix [furosemide]   Review of Systems Review of Systems  Constitutional: Negative for appetite change, chills and fever.  HENT: Positive for dental problem. Negative for congestion, drooling, mouth sores, sinus pressure, sinus pain, sore throat and trouble swallowing.   Respiratory: Negative for cough and shortness of breath.   Gastrointestinal: Negative for abdominal pain, nausea and vomiting.  Genitourinary: Negative for dysuria.  Musculoskeletal: Negative for back pain.  Skin: Negative for rash.  Allergic/Immunologic: Negative for immunocompromised state.  Neurological: Negative for dizziness, light-headedness and headaches.     Physical Exam Updated Vital Signs BP 122/78 (BP Location: Right Arm)   Pulse 74   Temp 97.8 F (36.6 C) (Oral)   Resp 18   Ht 5\' 8"  (1.727 m)   Wt 90 kg   SpO2 98%   BMI 30.17 kg/m   Physical Exam Vitals signs and nursing note reviewed.  Constitutional:  Appearance: Normal appearance.  HENT:     Head: Normocephalic.     Nose: Nose normal.     Mouth/Throat:     Mouth: Mucous membranes are moist.     Pharynx: Oropharynx is clear. No oropharyngeal exudate or posterior oropharyngeal erythema.     Comments: Patient has multiple missing teeth and dental caries.  She has tenderness to palpation in the left upper gums with no fluctuation or obvious abscess.  Minimal facial swelling. Eyes:     Conjunctiva/sclera: Conjunctivae normal.  Cardiovascular:     Rate and Rhythm: Normal rate and regular rhythm.  Pulmonary:     Effort: Pulmonary effort is normal.  Skin:    General: Skin is dry.   Neurological:     Mental Status: She is alert.  Psychiatric:        Mood and Affect: Mood normal.      ED Treatments / Results  Labs (all labs ordered are listed, but only abnormal results are displayed) Labs Reviewed - No data to display  EKG None  Radiology No results found.  Procedures Procedures (including critical care time)  Medications Ordered in ED Medications  amoxicillin (AMOXIL) capsule 500 mg (has no administration in time range)  naproxen (NAPROSYN) tablet 500 mg (has no administration in time range)     Initial Impression / Assessment and Plan / ED Course  I have reviewed the triage vital signs and the nursing notes.  Pertinent labs & imaging results that were available during my care of the patient were reviewed by me and considered in my medical decision making (see chart for details).  Clinical Course as of Nov 24 1122  Sat Nov 24, 2018  1123 Patient here for dental pain for 2 weeks.  No obvious abscess on my exam.  No airway compromise.  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  Patient was given a dose of amoxicillin and naproxen here today and a prescription was sent to her pharmacy.  She was also given a handout for dental resources as well as the free clinic of [facility] to follow-up with along with some other primary care doctors that she may follow-up with.   [KM]    Clinical Course User Index [KM] Alveria Apley, PA-C       Based on review of vitals, medical screening exam, lab work and/or imaging, there does not appear to be an acute, emergent etiology for the patient's symptoms. Counseled pt on good return precautions and encouraged both PCP and ED follow-up as needed.  Prior to discharge, I also discussed incidental imaging findings with patient in detail and advised appropriate, recommended follow-up in detail.  Clinical Impression: 1. Pain, dental      Disposition: Discharge  Prior to providing a prescription for a controlled substance, I independently reviewed the patient's recent prescription history on the [facility]. The patient had no recent or regular prescriptions and was deemed appropriate for a brief, less than 3 day prescription of narcotic for acute analgesia.  This note was prepared with assistance of Systems analyst. Occasional wrong-word or sound-a-like substitutions may have occurred due to the inherent limitations of voice recognition software.   Final Clinical Impressions(s) / ED Diagnoses   Final diagnoses:  Pain, dental    ED Discharge Orders         Ordered    amoxicillin (AMOXIL)  500 MG capsule  3 times daily     11/24/18 1119    naproxen (NAPROSYN) 500 MG tablet  2 times daily     11/24/18 [address]1119           Mishaal Lansdale A, PA-C 11/24/18 1124    Mancel BaleWentz, Elliott, MD 11/25/18 [phone]

## 2018-11-24 NOTE — Discharge Instructions (Signed)
You do appear to have gum and dental pain here today.  There might be a slight infection in her gums.  You are given a medication for pain and inflammation as well as an antibiotic.  You are also given a resource paper to follow-up with a new primary care doctor.  Specifically, there is a free clinic of [facility] in [place] that you can visit.  Take the medication as prescribed and return if you have any new or worsening symptoms.

## 2018-11-24 NOTE — ED Triage Notes (Signed)
Pt states she is having bad left sided facial pain and swelling x 3 days. Recently finished 2 antibiotics for "viral infection" in her foot. One of which was doxycycline, unknown other medication.

## 2018-11-27 ENCOUNTER — Other Ambulatory Visit: Payer: Self-pay

## 2018-11-27 ENCOUNTER — Emergency Department (HOSPITAL_COMMUNITY)
Admission: EM | Admit: 2018-11-27 | Discharge: 2018-11-27 | Disposition: A | Discharge status: Home / Self Care | Payer: Self-pay | Attending: Emergency Medicine | Admitting: Emergency Medicine

## 2018-11-27 ENCOUNTER — Encounter (HOSPITAL_COMMUNITY): Payer: Self-pay

## 2018-11-27 DIAGNOSIS — K0889 Other specified disorders of teeth and supporting structures: Secondary | ICD-10-CM | POA: Insufficient documentation

## 2018-11-27 DIAGNOSIS — F17210 Nicotine dependence, cigarettes, uncomplicated: Secondary | ICD-10-CM | POA: Insufficient documentation

## 2018-11-27 MED ORDER — HYDROCODONE-ACETAMINOPHEN 5-325 MG PO TABS: ORAL_TABLET | ORAL | 0 refills | Status: DC

## 2018-11-27 MED ORDER — AZITHROMYCIN 250 MG PO TABS: ORAL_TABLET | ORAL | 0 refills | Status: DC

## 2018-11-27 MED ORDER — HYDROCODONE-ACETAMINOPHEN 5-325 MG PO TABS
1.0000 | ORAL_TABLET | Freq: Once | ORAL | Status: AC
  Administered 2018-11-27: 15:00:00 1 via ORAL
  Filled 2018-11-27: qty 1

## 2018-11-27 MED ORDER — AZITHROMYCIN 250 MG PO TABS
500.0000 mg | ORAL_TABLET | Freq: Once | ORAL | Status: AC
  Administered 2018-11-27: 15:00:00 500 mg via ORAL
  Filled 2018-11-27: qty 2

## 2018-11-27 NOTE — Discharge Instructions (Addendum)
Stop the amoxicillin.  You can start the Zithromax prescription tomorrow.  You may apply warm compresses to your face.  Follow-up with a dentist soon.

## 2018-11-27 NOTE — ED Provider Notes (Signed)
[facility] EMERGENCY DEPARTMENT Provider Note   CSN: [id number] Arrival date & time: 11/27/18  1029     History   Chief Complaint Chief Complaint  Patient presents with  . Facial Swelling    HPI Alexandra Floyd is a 53 y.o. female.     HPI   Alexandra Floyd is a 53 y.o. female with history of recurrent cellulitis and hep C who presents to the Emergency Department complaining of persistent left facial pain, dental pain and facial swelling.  Symptoms also associated with sinus pressure.  She was seen here 4 days ago and prescribed a nonsteroidal and amoxicillin.  She is taking the antibiotic without relief.  She states the swelling is worsening.  She states she was also recently seen for cellulitis of her foot and was prescribed doxycycline and believes that the infection has spread to her face.  She reports low-grade fever at home that is subjective.  She denies headache, dizziness, neck pain, difficulty swallowing or breathing.     Past Medical History:  Diagnosis Date  . Cellulitis   . Hep C w/ coma, chronic (HCC)     There are no active problems to display for this patient.   Past Surgical History:  Procedure Laterality Date  . arm sx    . CHOLECYSTECTOMY       OB History   No obstetric history on file.      Home Medications    Prior to Admission medications   Medication Sig Start Date End Date Taking? Authorizing Provider  amoxicillin (AMOXIL) 500 MG capsule Take 1 capsule (500 mg total) by mouth 3 (three) times daily. 11/24/18   Ronnie Doss A, PA-C  amoxicillin-clavulanate (AUGMENTIN) 875-125 MG tablet Take 1 tablet by mouth every 12 (twelve) hours. 09/20/18   Gerhard Munch, MD  naproxen (NAPROSYN) 500 MG tablet Take 1 tablet (500 mg total) by mouth 2 (two) times daily. 11/24/18   Arlyn Dunning, PA-C    Family History No family history on file.  Social History Social History   Tobacco Use  . Smoking status: Current Every Day Smoker   Packs/day: 0.50    Types: Cigarettes  . Smokeless tobacco: Never Used  Substance Use Topics  . Alcohol use: Yes    Comment: social  . Drug use: Yes    Types: Cocaine    Comment: Crack      Allergies   Lasix [furosemide]   Review of Systems Review of Systems  Constitutional: Negative for appetite change and fever.  HENT: Positive for dental problem, facial swelling and sinus pressure. Negative for congestion, sore throat and trouble swallowing.   Eyes: Negative for pain and visual disturbance.  Respiratory: Negative for cough, chest tightness and shortness of breath.   Cardiovascular: Negative for chest pain.  Gastrointestinal: Negative for abdominal pain, nausea and vomiting.  Genitourinary: Negative for dysuria.  Musculoskeletal: Negative for neck pain and neck stiffness.  Skin: Negative for rash.  Neurological: Negative for dizziness, syncope, facial asymmetry, weakness, numbness and headaches.  Hematological: Negative for adenopathy.     Physical Exam Updated Vital Signs BP (!) 141/94 (BP Location: Right Arm)   Pulse 81   Temp 98.1 F (36.7 C) (Oral)   Resp 18   Ht 5\' 3"  (1.6 m)   Wt 81.6 kg   SpO2 100%   BMI 31.89 kg/m   Physical Exam Vitals signs and nursing note reviewed.  Constitutional:      General: She is not in acute distress.  Appearance: Normal appearance. She is well-developed. She is not ill-appearing.  HENT:     Head: Normocephalic.     Jaw: There is normal jaw occlusion. No trismus or malocclusion.     Nose:     Right Sinus: No maxillary sinus tenderness or frontal sinus tenderness.     Left Sinus: Maxillary sinus tenderness present. No frontal sinus tenderness.     Mouth/Throat:     Mouth: Mucous membranes are moist.     Dentition: Dental caries present. No dental abscesses.     Pharynx: Oropharynx is clear. Uvula midline. No uvula swelling.     Comments: Dental caries with ttp of the left upper lateral incisors and surrounding gingiva.   No erythema or fluctuance.  No appreciable facial edema or erythema. No trismus. Eyes:     Extraocular Movements: Extraocular movements intact.     Conjunctiva/sclera: Conjunctivae normal.     Pupils: Pupils are equal, round, and reactive to light.     Comments: No periorbital erythema or edema.   Neck:     Musculoskeletal: Normal range of motion. No muscular tenderness.  Cardiovascular:     Rate and Rhythm: Normal rate and regular rhythm.     Pulses: Normal pulses.  Pulmonary:     Effort: Pulmonary effort is normal.     Breath sounds: Normal breath sounds. No wheezing.  Musculoskeletal: Normal range of motion.  Lymphadenopathy:     Cervical: No cervical adenopathy.  Skin:    General: Skin is warm.     Findings: No rash.  Neurological:     General: No focal deficit present.     Mental Status: She is alert.     Sensory: No sensory deficit.     Motor: No abnormal muscle tone.      ED Treatments / Results  Labs (all labs ordered are listed, but only abnormal results are displayed) Labs Reviewed - No data to display  EKG None  Radiology No results found.  Procedures Procedures (including critical care time)  Medications Ordered in ED Medications  HYDROcodone-acetaminophen (NORCO/VICODIN) 5-325 MG per tablet 1 tablet (has no administration in time range)  azithromycin (ZITHROMAX) tablet 500 mg (has no administration in time range)     Initial Impression / Assessment and Plan / ED Course  I have reviewed the triage vital signs and the nursing notes.  Pertinent labs & imaging results that were available during my care of the patient were reviewed by me and considered in my medical decision making (see chart for details).        Pt with dental pain and reports facial edema w/o appreciable edema on exam.  No concerning sx's for ludwig's angina.  Airway patent.  Left maxillary sinus tenderness, sx's may be related to sinusitis.  She agrees to tx plan.  Appears  appropriate for d/c home.   Final Clinical Impressions(s) / ED Diagnoses   Final diagnoses:  Pain, dental    ED Discharge Orders    None       Bufford Lope 11/28/18 2211    Nat Christen, MD 11/29/18 1015

## 2018-11-27 NOTE — ED Triage Notes (Signed)
Pt presents to ED with complaints of recurrent left sided facial swelling. Pt states she was seen in the ED and treated with antibiotics, on day 4 now.

## 2019-03-08 ENCOUNTER — Emergency Department (HOSPITAL_COMMUNITY)
Admission: EM | Admit: 2019-03-08 | Discharge: 2019-03-09 | Discharge status: Against Medical Advice | Payer: Self-pay | Attending: Emergency Medicine | Admitting: Emergency Medicine

## 2019-03-08 ENCOUNTER — Other Ambulatory Visit: Payer: Self-pay

## 2019-03-08 ENCOUNTER — Encounter (HOSPITAL_COMMUNITY): Payer: Self-pay | Admitting: Emergency Medicine

## 2019-03-08 DIAGNOSIS — Z5320 Procedure and treatment not carried out because of patient's decision for unspecified reasons: Secondary | ICD-10-CM | POA: Insufficient documentation

## 2019-03-08 DIAGNOSIS — R21 Rash and other nonspecific skin eruption: Secondary | ICD-10-CM | POA: Insufficient documentation

## 2019-03-08 DIAGNOSIS — F17210 Nicotine dependence, cigarettes, uncomplicated: Secondary | ICD-10-CM | POA: Insufficient documentation

## 2019-03-08 MED ORDER — HYDROXYZINE HCL 25 MG PO TABS
50.0000 mg | ORAL_TABLET | Freq: Once | ORAL | Status: AC
  Administered 2019-03-08: 23:00:00 50 mg via ORAL
  Filled 2019-03-08: qty 2

## 2019-03-08 NOTE — ED Triage Notes (Signed)
Pt C/O bilateral hand and forearm pain with redness that began around 1500 after cleaning with "bubling stuff in the bathroom." Denies mouth, tongue, or lip swelling.

## 2019-03-08 NOTE — ED Notes (Signed)
Pt requested and received something to eat and drink.

## 2019-03-09 NOTE — ED Notes (Signed)
Pt seen walking down hall toward waiting room by Casimiro Needle, Charity fundraiser.

## 2019-03-09 NOTE — ED Provider Notes (Signed)
[facility] EMERGENCY DEPARTMENT Provider Note   CSN: [id number] Arrival date & time: 03/08/19  1956     History Chief Complaint  Patient presents with  . Allergic Reaction    Alexandra Floyd is a 54 y.o. female with PMH sniffing for hepatitis C with, as well as history of cellulitis who presents to the ED with complaints of allergic reaction.  Patient reports that she was cleaning her house and use a separate cleaning agent today.  She believes that it has caused a reaction to her arms which she reports are warm and "burning".  She is a very poor historian and is acting erratically and constantly picking at her head and arms.  When asked if she engages in any illicit drug use, she reports that she is a drinker and that she also has been using heroin recently.  She denies any chest pain or difficulty breathing, fevers or chills, or recent systemic illness.  She also denies any numbness or weakness and instead only endorses this redness and "burning" sensation that she has in her arms.  She was curious if this was a cellulitis picture.  She also denies any tongue swelling, wheezing or difficulty breathing, GI symptoms, or hives.  HPI     Past Medical History:  Diagnosis Date  . Cellulitis   . Hep C w/ coma, chronic (HCC)     There are no problems to display for this patient.   Past Surgical History:  Procedure Laterality Date  . arm sx    . CHOLECYSTECTOMY       OB History   No obstetric history on file.     No family history on file.  Social History   Tobacco Use  . Smoking status: Current Every Day Smoker    Packs/day: 0.50    Types: Cigarettes  . Smokeless tobacco: Never Used  Substance Use Topics  . Alcohol use: Yes    Comment: social  . Drug use: Yes    Types: Cocaine    Comment: Crack     Home Medications Prior to Admission medications   Not on File    Allergies    Lasix [furosemide]  Review of Systems   Review of Systems  Constitutional:  Negative for chills and fever.  Skin: Positive for color change.  Neurological: Negative for numbness.  Psychiatric/Behavioral: The patient is nervous/anxious.     Physical Exam Updated Vital Signs BP 115/77 (BP Location: Right Arm)   Pulse 95   Temp 97.7 F (36.5 C) (Oral)   Resp 20   Wt 86.1 kg   SpO2 99%   BMI 33.64 kg/m   Physical Exam Vitals and nursing note reviewed. Exam conducted with a chaperone present.  HENT:     Head: Normocephalic and atraumatic.     Mouth/Throat:     Comments: No tongue swelling.  Patent oropharynx.  Tolerating secretions. Eyes:     General: No scleral icterus.    Conjunctiva/sclera: Conjunctivae normal.  Cardiovascular:     Rate and Rhythm: Normal rate and regular rhythm.     Pulses: Normal pulses.     Heart sounds: Normal heart sounds.  Pulmonary:     Effort: Pulmonary effort is normal. No respiratory distress.     Breath sounds: No wheezing.  Abdominal:     General: Abdomen is flat. There is no distension.     Palpations: Abdomen is soft.     Tenderness: There is no abdominal tenderness.  Musculoskeletal:  Comments: Right arm: Chronic deformity due to injury.  Skin:    General: Skin is dry.     Capillary Refill: Capillary refill takes less than 2 seconds.     Comments: Mild erythema on forearms bilaterally.  No induration concerning for cellulitis.  Distal pulses and sensation intact throughout.  Full ROM and strength intact.  Tiny wound on her hands, however none appear to be affected at this time.  No swelling.  Neurological:     General: No focal deficit present.     Mental Status: She is alert and oriented to person, place, and time.     GCS: GCS eye subscore is 4. GCS verbal subscore is 5. GCS motor subscore is 6.     Cranial Nerves: No cranial nerve deficit.     Sensory: No sensory deficit.     Motor: No weakness.     Gait: Gait normal.  Psychiatric:     Comments: Patient acting erratically.  Picking at face and arms.   Appears intoxicated.  Anxious.     ED Results / Procedures / Treatments   Labs (all labs ordered are listed, but only abnormal results are displayed) Labs Reviewed - No data to display  EKG None  Radiology No results found.  Procedures Procedures (including critical care time)  Medications Ordered in ED Medications  hydrOXYzine (ATARAX/VISTARIL) tablet 50 mg (50 mg Oral Given 03/08/19 2301)    ED Course  I have reviewed the triage vital signs and the nursing notes.  Pertinent labs & imaging results that were available during my care of the patient were reviewed by me and considered in my medical decision making (see chart for details).    MDM Rules/Calculators/A&P                      Patient left hospital AMA.  At this time, low suspicion for concerning cellulitis.  She is afebrile and denies any fevers or chills.  Her vital signs are all within normal is.  She appeared high on my examination and was very anxious and constantly picking at her arms.  I provided her with 50 mg of Atarax to calm her before reevaluation.  However, she eloped/left AMA before reevaluation occurred.  However, my physical exam was relatively benign and there was no evidence of anaphylaxis at this time.  She denies any GI symptoms and there was no abdominal tenderness.  There was no wheezing on my exam.  No tongue swelling or induration of the floor of mouth.  Her oropharynx is patent.  There were no wheals/hives on exam.  This certainly was not an anaphylactic reaction and I have low suspicion for cellulitis at this time.  She was neurovascularly intact and hemodynamically stable.  No focal deficits on exam.  However, we have encouraged her to return to the ED immediately for any new or worsening symptoms.   Final Clinical Impression(s) / ED Diagnoses Final diagnoses:  Rash    Rx / DC Orders ED Discharge Orders    None       Corena Herter, PA-C 03/09/19 0023    Daleen Bo, MD 03/09/19  [phone]

## 2019-06-27 ENCOUNTER — Emergency Department (HOSPITAL_COMMUNITY)
Admission: EM | Admit: 2019-06-27 | Discharge: 2019-06-27 | Disposition: A | Discharge status: Home / Self Care | Payer: Self-pay | Attending: Emergency Medicine | Admitting: Emergency Medicine

## 2019-06-27 ENCOUNTER — Other Ambulatory Visit: Payer: Self-pay

## 2019-06-27 ENCOUNTER — Encounter (HOSPITAL_COMMUNITY): Payer: Self-pay | Admitting: *Deleted

## 2019-06-27 DIAGNOSIS — R109 Unspecified abdominal pain: Secondary | ICD-10-CM | POA: Insufficient documentation

## 2019-06-27 NOTE — ED Triage Notes (Signed)
States she has something moving around in her abdomen for 2 months

## 2019-09-19 ENCOUNTER — Other Ambulatory Visit: Payer: Self-pay

## 2019-09-19 ENCOUNTER — Encounter (HOSPITAL_COMMUNITY): Payer: Self-pay | Admitting: Emergency Medicine

## 2019-09-19 ENCOUNTER — Emergency Department (HOSPITAL_COMMUNITY)
Admission: EM | Admit: 2019-09-19 | Discharge: 2019-09-20 | Disposition: A | Discharge status: Other Acute Inpatient Hospital | Payer: Self-pay | Attending: Emergency Medicine | Admitting: Emergency Medicine

## 2019-09-19 DIAGNOSIS — F1010 Alcohol abuse, uncomplicated: Secondary | ICD-10-CM | POA: Insufficient documentation

## 2019-09-19 DIAGNOSIS — R451 Restlessness and agitation: Secondary | ICD-10-CM | POA: Insufficient documentation

## 2019-09-19 DIAGNOSIS — Z20822 Contact with and (suspected) exposure to covid-19: Secondary | ICD-10-CM | POA: Insufficient documentation

## 2019-09-19 DIAGNOSIS — Y904 Blood alcohol level of 80-99 mg/100 ml: Secondary | ICD-10-CM | POA: Insufficient documentation

## 2019-09-19 DIAGNOSIS — F1510 Other stimulant abuse, uncomplicated: Secondary | ICD-10-CM | POA: Insufficient documentation

## 2019-09-19 DIAGNOSIS — F1410 Cocaine abuse, uncomplicated: Secondary | ICD-10-CM | POA: Insufficient documentation

## 2019-09-19 DIAGNOSIS — E669 Obesity, unspecified: Secondary | ICD-10-CM | POA: Insufficient documentation

## 2019-09-19 DIAGNOSIS — R45851 Suicidal ideations: Secondary | ICD-10-CM | POA: Insufficient documentation

## 2019-09-19 DIAGNOSIS — R45850 Homicidal ideations: Secondary | ICD-10-CM | POA: Insufficient documentation

## 2019-09-19 LAB — RAPID URINE DRUG SCREEN, HOSP PERFORMED
Amphetamines: POSITIVE — AB
Barbiturates: NOT DETECTED
Benzodiazepines: NOT DETECTED
Cocaine: POSITIVE — AB
Opiates: NOT DETECTED
Tetrahydrocannabinol: NOT DETECTED

## 2019-09-19 LAB — CBC
HCT: 40 % (ref 36.0–46.0)
Hemoglobin: 13 g/dL (ref 12.0–15.0)
MCH: 31.4 pg (ref 26.0–34.0)
MCHC: 32.5 g/dL (ref 30.0–36.0)
MCV: 96.6 fL (ref 80.0–100.0)
Platelets: 268 10*3/uL (ref 150–400)
RBC: 4.14 MIL/uL (ref 3.87–5.11)
RDW: 13 % (ref 11.5–15.5)
WBC: 5.7 10*3/uL (ref 4.0–10.5)
nRBC: 0 % (ref 0.0–0.2)

## 2019-09-19 MED ORDER — FAMOTIDINE 20 MG PO TABS
20.0000 mg | ORAL_TABLET | Freq: Once | ORAL | Status: AC
  Administered 2019-09-20: 20 mg via ORAL
  Filled 2019-09-19: qty 1

## 2019-09-19 MED ORDER — ALUM & MAG HYDROXIDE-SIMETH 200-200-20 MG/5ML PO SUSP
30.0000 mL | Freq: Once | ORAL | Status: AC
  Administered 2019-09-20: 30 mL via ORAL
  Filled 2019-09-19: qty 30

## 2019-09-19 NOTE — ED Triage Notes (Signed)
Patient states she is here so she will not hurt herself or anyone else. Pt states she has drank approximately 6 beers and smoked cracked this evening. Patient states she needs some help.

## 2019-09-20 ENCOUNTER — Encounter (HOSPITAL_COMMUNITY): Payer: Self-pay | Admitting: Psychiatry

## 2019-09-20 ENCOUNTER — Inpatient Hospital Stay (HOSPITAL_COMMUNITY)
Admission: AD | Admit: 2019-09-20 | Discharge: 2019-09-25 | DRG: 885 | Disposition: A | Discharge status: Home / Self Care | Payer: Federal, State, Local not specified - Other | Source: Intra-hospital | Attending: Psychiatry | Admitting: Psychiatry

## 2019-09-20 DIAGNOSIS — F10129 Alcohol abuse with intoxication, unspecified: Secondary | ICD-10-CM | POA: Diagnosis present

## 2019-09-20 DIAGNOSIS — Y904 Blood alcohol level of 80-99 mg/100 ml: Secondary | ICD-10-CM | POA: Diagnosis present

## 2019-09-20 DIAGNOSIS — F17210 Nicotine dependence, cigarettes, uncomplicated: Secondary | ICD-10-CM | POA: Diagnosis present

## 2019-09-20 DIAGNOSIS — F332 Major depressive disorder, recurrent severe without psychotic features: Principal | ICD-10-CM | POA: Diagnosis present

## 2019-09-20 DIAGNOSIS — F1994 Other psychoactive substance use, unspecified with psychoactive substance-induced mood disorder: Secondary | ICD-10-CM

## 2019-09-20 DIAGNOSIS — F333 Major depressive disorder, recurrent, severe with psychotic symptoms: Secondary | ICD-10-CM | POA: Diagnosis not present

## 2019-09-20 DIAGNOSIS — F14222 Cocaine dependence with intoxication with perceptual disturbance: Secondary | ICD-10-CM | POA: Diagnosis present

## 2019-09-20 DIAGNOSIS — F1910 Other psychoactive substance abuse, uncomplicated: Secondary | ICD-10-CM | POA: Diagnosis not present

## 2019-09-20 DIAGNOSIS — Z818 Family history of other mental and behavioral disorders: Secondary | ICD-10-CM | POA: Diagnosis not present

## 2019-09-20 DIAGNOSIS — F10229 Alcohol dependence with intoxication, unspecified: Secondary | ICD-10-CM | POA: Diagnosis present

## 2019-09-20 DIAGNOSIS — G4700 Insomnia, unspecified: Secondary | ICD-10-CM | POA: Diagnosis present

## 2019-09-20 DIAGNOSIS — Z20822 Contact with and (suspected) exposure to covid-19: Secondary | ICD-10-CM | POA: Diagnosis present

## 2019-09-20 DIAGNOSIS — F14129 Cocaine abuse with intoxication, unspecified: Secondary | ICD-10-CM | POA: Diagnosis present

## 2019-09-20 DIAGNOSIS — F1220 Cannabis dependence, uncomplicated: Secondary | ICD-10-CM | POA: Diagnosis present

## 2019-09-20 DIAGNOSIS — F1410 Cocaine abuse, uncomplicated: Secondary | ICD-10-CM

## 2019-09-20 LAB — COMPREHENSIVE METABOLIC PANEL
ALT: 29 U/L (ref 0–44)
AST: 28 U/L (ref 15–41)
Albumin: 4.1 g/dL (ref 3.5–5.0)
Alkaline Phosphatase: 82 U/L (ref 38–126)
Anion gap: 12 (ref 5–15)
BUN: 11 mg/dL (ref 6–20)
CO2: 19 mmol/L — ABNORMAL LOW (ref 22–32)
Calcium: 9.1 mg/dL (ref 8.9–10.3)
Chloride: 106 mmol/L (ref 98–111)
Creatinine, Ser: 0.76 mg/dL (ref 0.44–1.00)
GFR calc Af Amer: >60 mL/min (ref >60)
GFR calc non Af Amer: >60 mL/min (ref >60)
Glucose, Bld: 98 mg/dL (ref 70–99)
Potassium: 4.1 mmol/L (ref 3.5–5.1)
Sodium: 137 mmol/L (ref 135–145)
Total Bilirubin: 0.2 mg/dL — ABNORMAL LOW (ref 0.3–1.2)
Total Protein: 8.4 g/dL — ABNORMAL HIGH (ref 6.5–8.1)

## 2019-09-20 LAB — ACETAMINOPHEN LEVEL: Acetaminophen (Tylenol), Serum: <10 ug/mL — ABNORMAL LOW (ref 10–30)

## 2019-09-20 LAB — ETHANOL: Alcohol, Ethyl (B): 96 mg/dL — ABNORMAL HIGH (ref <10)

## 2019-09-20 LAB — SALICYLATE LEVEL: Salicylate Lvl: <7 mg/dL — ABNORMAL LOW (ref 7.0–30.0)

## 2019-09-20 LAB — PREGNANCY, URINE: Preg Test, Ur: NEGATIVE

## 2019-09-20 LAB — SARS CORONAVIRUS 2 BY RT PCR (HOSPITAL ORDER, PERFORMED IN ~~LOC~~ HOSPITAL LAB): SARS Coronavirus 2: NEGATIVE

## 2019-09-20 MED ORDER — IBUPROFEN 800 MG PO TABS
800.0000 mg | ORAL_TABLET | Freq: Once | ORAL | Status: AC
  Administered 2019-09-20: 800 mg via ORAL
  Filled 2019-09-20: qty 1

## 2019-09-20 MED ORDER — ACETAMINOPHEN 325 MG PO TABS
650.0000 mg | ORAL_TABLET | Freq: Four times a day (QID) | ORAL | Status: DC | PRN
  Administered 2019-09-21 – 2019-09-24 (×8): 650 mg via ORAL
  Filled 2019-09-20 (×8): qty 2

## 2019-09-20 MED ORDER — ADULT MULTIVITAMIN W/MINERALS CH
1.0000 | ORAL_TABLET | Freq: Every day | ORAL | Status: DC
  Administered 2019-09-21 – 2019-09-25 (×5): 1 via ORAL
  Filled 2019-09-20 (×6): qty 1

## 2019-09-20 MED ORDER — LORAZEPAM 1 MG PO TABS
1.0000 mg | ORAL_TABLET | Freq: Three times a day (TID) | ORAL | Status: AC
  Administered 2019-09-22 – 2019-09-23 (×3): 1 mg via ORAL
  Filled 2019-09-20 (×3): qty 1

## 2019-09-20 MED ORDER — ONDANSETRON 4 MG PO TBDP: 4.0000 mg | ORAL_TABLET | Freq: Four times a day (QID) | ORAL | Status: AC | PRN

## 2019-09-20 MED ORDER — TRAZODONE HCL 100 MG PO TABS
100.0000 mg | ORAL_TABLET | Freq: Every evening | ORAL | Status: DC | PRN
  Administered 2019-09-20 – 2019-09-24 (×4): 100 mg via ORAL
  Filled 2019-09-20 (×5): qty 1

## 2019-09-20 MED ORDER — HYDROXYZINE HCL 25 MG PO TABS
25.0000 mg | ORAL_TABLET | Freq: Four times a day (QID) | ORAL | Status: AC | PRN
  Administered 2019-09-22: 25 mg via ORAL
  Filled 2019-09-20 (×2): qty 1

## 2019-09-20 MED ORDER — THIAMINE HCL 100 MG/ML IJ SOLN
100.0000 mg | Freq: Once | INTRAMUSCULAR | Status: AC
  Administered 2019-09-21: 100 mg via INTRAMUSCULAR
  Filled 2019-09-20: qty 2

## 2019-09-20 MED ORDER — ONDANSETRON 8 MG PO TBDP: 8.0000 mg | ORAL_TABLET | Freq: Once | ORAL | Status: DC

## 2019-09-20 MED ORDER — LOPERAMIDE HCL 2 MG PO CAPS: 2.0000 mg | ORAL_CAPSULE | ORAL | Status: AC | PRN

## 2019-09-20 MED ORDER — MAGNESIUM HYDROXIDE 400 MG/5ML PO SUSP: 30.0000 mL | Freq: Every day | ORAL | Status: DC | PRN

## 2019-09-20 MED ORDER — FAMOTIDINE 20 MG PO TABS
20.0000 mg | ORAL_TABLET | Freq: Once | ORAL | Status: AC
  Administered 2019-09-20: 20 mg via ORAL
  Filled 2019-09-20 (×2): qty 1

## 2019-09-20 MED ORDER — THIAMINE HCL 100 MG PO TABS
100.0000 mg | ORAL_TABLET | Freq: Every day | ORAL | Status: DC
  Administered 2019-09-21 – 2019-09-25 (×5): 100 mg via ORAL
  Filled 2019-09-20 (×6): qty 1

## 2019-09-20 MED ORDER — ALUM & MAG HYDROXIDE-SIMETH 200-200-20 MG/5ML PO SUSP: 30.0000 mL | ORAL | Status: DC | PRN

## 2019-09-20 MED ORDER — LORAZEPAM 1 MG PO TABS
1.0000 mg | ORAL_TABLET | Freq: Four times a day (QID) | ORAL | Status: AC
  Administered 2019-09-21 – 2019-09-22 (×5): 1 mg via ORAL
  Filled 2019-09-20 (×5): qty 1

## 2019-09-20 MED ORDER — LORAZEPAM 1 MG PO TABS
1.0000 mg | ORAL_TABLET | Freq: Two times a day (BID) | ORAL | Status: AC
  Administered 2019-09-23 – 2019-09-24 (×2): 1 mg via ORAL
  Filled 2019-09-20 (×2): qty 1

## 2019-09-20 MED ORDER — LORAZEPAM 1 MG PO TABS
1.0000 mg | ORAL_TABLET | Freq: Four times a day (QID) | ORAL | Status: AC | PRN
  Administered 2019-09-21: 1 mg via ORAL
  Filled 2019-09-20: qty 1

## 2019-09-20 MED ORDER — ALUM & MAG HYDROXIDE-SIMETH 200-200-20 MG/5ML PO SUSP
30.0000 mL | Freq: Once | ORAL | Status: AC
  Administered 2019-09-20: 30 mL via ORAL

## 2019-09-20 MED ORDER — LORAZEPAM 1 MG PO TABS: 1.0000 mg | ORAL_TABLET | Freq: Every day | ORAL | Status: DC

## 2019-09-20 MED ORDER — HYDROXYZINE HCL 25 MG PO TABS
25.0000 mg | ORAL_TABLET | Freq: Three times a day (TID) | ORAL | Status: DC | PRN
  Administered 2019-09-20: 25 mg via ORAL
  Filled 2019-09-20: qty 1
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
## 2019-09-20 NOTE — Progress Notes (Signed)
   09/20/19 1955  COVID-19 Daily Checkoff  Have you had a fever (temp > 37.80C/100F)  in the past 24 hours?  No  COVID-19 EXPOSURE  Have you traveled outside the state in the past 14 days? No  Have you been in contact with someone with a confirmed diagnosis of COVID-19 or PUI in the past 14 days without wearing appropriate PPE? No  Have you been living in the same home as a person with confirmed diagnosis of COVID-19 or a PUI (household contact)? No  Have you been diagnosed with COVID-19? No

## 2019-09-20 NOTE — Progress Notes (Signed)
Pt accepted to [facility], bed 302-2    Dr. Lucianne Muss is the accepting provider.    Dr. Jola Babinski is the attending provider.    Call report to [phone]    Eboni @ [facility] notified via secure chat  Pt is voluntary and will be transported by [facility]  Pt is scheduled to arrive at [facility] at 1230pm, pending her COVID test results.    Wells Guiles, LCSW, LCAS Disposition CSW [facility]S [phone]

## 2019-09-20 NOTE — ED Provider Notes (Signed)
[facility] EMERGENCY DEPARTMENT Provider Note   CSN: [id number] Arrival date & time: 09/19/19  2239   Time seen 11:28 PM  History Chief Complaint  Patient presents with   Suicidal    Alexandra Floyd is a 54 y.o. female.  HPI   Patient states she called the police tonight to bring her to the ED because she was afraid she might hurt herself or the family of her boyfriend where she lives. She states she had no specific plan. She does admit to doing drugs and states she has been doing crack and methamphetamine including today. She also has been drinking. She has a history of depression however she doesn't take the medications because they make her feel worse. She states the last time she went to drug rehab was 5 to 6 years ago and the last time she was admitted to psychiatric hospital was 3 to 4 years ago, she states she moved here from [place] about 2 years ago and has not gotten a psychiatrist or a therapist. When I enter the room she states she feels like she has to throw up. She states she is having lots of heartburn and nausea but no vomiting. She also states she feels like she has something moving around in her abdomen that she has had for several years. During our conversation she all of a sudden starts laughing and states my shield I'm wearing is blinking lights.  No Covid vaccine  PCP Patient, No Pcp Per   Past Medical History:  Diagnosis Date   Cellulitis    Hep C w/ coma, chronic (HCC)     There are no problems to display for this patient.   Past Surgical History:  Procedure Laterality Date   arm sx     CHOLECYSTECTOMY       OB History   No obstetric history on file.     History reviewed. No pertinent family history.  Social History   Tobacco Use   Smoking status: Current Every Day Smoker    Packs/day: 0.50    Types: Cigarettes   Smokeless tobacco: Never Used  Vaping Use   Vaping Use: Never used  Substance Use Topics   Alcohol use: Yes     Alcohol/week: 42.0 standard drinks    Types: 42 Cans of beer per week    Comment: social   Drug use: Yes    Frequency: 4.0 times per week    Types: Cocaine    Comment: Crack -smokes  lives with boyfriend and his sons  Home Medications Prior to Admission medications   Not on File    Allergies    Lasix [furosemide]  Review of Systems   Review of Systems  All other systems reviewed and are negative.   Physical Exam Updated Vital Signs BP 109/79 (BP Location: Right Arm)    Pulse 91    Temp 99.3 F (37.4 C) (Oral)    Resp 18    Ht 5\' 3"  (1.6 m)    Wt 78.9 kg    SpO2 98%    BMI 30.82 kg/m   Physical Exam Vitals and nursing note reviewed.  Constitutional:      Appearance: Normal appearance. She is obese.  HENT:     Head: Normocephalic and atraumatic.     Right Ear: External ear normal.     Left Ear: External ear normal.  Eyes:     Extraocular Movements: Extraocular movements intact.     Conjunctiva/sclera: Conjunctivae normal.  Pupils: Pupils are equal, round, and reactive to light.  Cardiovascular:     Rate and Rhythm: Normal rate and regular rhythm.     Pulses: Normal pulses.  Pulmonary:     Effort: Pulmonary effort is normal. No respiratory distress.     Breath sounds: Normal breath sounds.  Abdominal:     General: Abdomen is flat.     Palpations: Abdomen is soft.     Tenderness: There is no abdominal tenderness.  Musculoskeletal:        General: Normal range of motion.     Cervical back: Normal range of motion and neck supple.  Skin:    General: Skin is warm and dry.     Capillary Refill: Capillary refill takes less than 2 seconds.  Neurological:     General: No focal deficit present.     Mental Status: She is alert and oriented to person, place, and time.     Cranial Nerves: No cranial nerve deficit.  Psychiatric:        Mood and Affect: Affect is labile.        Speech: Speech is rapid and pressured.        Behavior: Behavior is agitated.     ED  Results / Procedures / Treatments   Labs (all labs ordered are listed, but only abnormal results are displayed) Results for orders placed or performed during the hospital encounter of 09/19/19  Comprehensive metabolic panel  Result Value Ref Range   Sodium 137 135 - 145 mmol/L   Potassium 4.1 3.5 - 5.1 mmol/L   Chloride 106 98 - 111 mmol/L   CO2 19 (L) 22 - 32 mmol/L   Glucose, Bld 98 70 - 99 mg/dL   BUN 11 6 - 20 mg/dL   Creatinine, Ser 0.81 0.44 - 1.00 mg/dL   Calcium 9.1 8.9 - 44.8 mg/dL   Total Protein 8.4 (H) 6.5 - 8.1 g/dL   Albumin 4.1 3.5 - 5.0 g/dL   AST 28 15 - 41 U/L   ALT 29 0 - 44 U/L   Alkaline Phosphatase 82 38 - 126 U/L   Total Bilirubin 0.2 (L) 0.3 - 1.2 mg/dL   GFR calc non Af Amer >60 >60 mL/min   GFR calc Af Amer >60 >60 mL/min   Anion gap 12 5 - 15  Ethanol  Result Value Ref Range   Alcohol, Ethyl (B) 96 (H) <10 mg/dL  Salicylate level  Result Value Ref Range   Salicylate Lvl <7.0 (L) 7.0 - 30.0 mg/dL  Acetaminophen level  Result Value Ref Range   Acetaminophen (Tylenol), Serum <10 (L) 10 - 30 ug/mL  cbc  Result Value Ref Range   WBC 5.7 4.0 - 10.5 K/uL   RBC 4.14 3.87 - 5.11 MIL/uL   Hemoglobin 13.0 12.0 - 15.0 g/dL   HCT 18.5 36 - 46 %   MCV 96.6 80.0 - 100.0 fL   MCH 31.4 26.0 - 34.0 pg   MCHC 32.5 30.0 - 36.0 g/dL   RDW 63.1 49.7 - 02.6 %   Platelets 268 150 - 400 K/uL   nRBC 0.0 0.0 - 0.2 %  Rapid urine drug screen (hospital performed)  Result Value Ref Range   Opiates NONE DETECTED NONE DETECTED   Cocaine POSITIVE (A) NONE DETECTED   Benzodiazepines NONE DETECTED NONE DETECTED   Amphetamines POSITIVE (A) NONE DETECTED   Tetrahydrocannabinol NONE DETECTED NONE DETECTED   Barbiturates NONE DETECTED NONE DETECTED  Pregnancy, urine  Result  Value Ref Range   Preg Test, Ur NEGATIVE NEGATIVE   Laboratory interpretation all normal except + UDS    EKG None  Radiology No results found.  Procedures Procedures (including critical care  time)  Medications Ordered in ED Medications  ondansetron (ZOFRAN-ODT) disintegrating tablet 8 mg (has no administration in time range)  famotidine (PEPCID) tablet 20 mg (20 mg Oral Given 09/20/19 0011)  alum & mag hydroxide-simeth (MAALOX/MYLANTA) 200-200-20 MG/5ML suspension 30 mL (30 mLs Oral Given 09/20/19 0011)    ED Course  I have reviewed the triage vital signs and the nursing notes.  Pertinent labs & imaging results that were available during my care of the patient were reviewed by me and considered in my medical decision making (see chart for details).    MDM Rules/Calculators/A&P                          Patient presents voluntarily after doing cocaine and methamphetamine and drinking alcohol. She states she was afraid she might harm her self or the family she lives with but has no plan.  She has a psych and substance abuse history.  6:50 AM Berna Spare, TTS has evaluated patient.  He states they recommend the psychiatrist see later this morning.  Final Clinical Impression(s) / ED Diagnoses Final diagnoses:  Suicidal thoughts  Homicidal thoughts  Alcohol abuse  Cocaine abuse (HCC)  Methamphetamine abuse (HCC)    Rx / DC Orders  Disposition pending  Devoria Albe, MD, Concha Pyo, MD 09/20/19 [phone]

## 2019-09-20 NOTE — BH Assessment (Signed)
Admission Note: Patient is a 54 year old female admitted to the unit from [facility] for polysubstance abuse and depression.  Reports drinking 6 to 8 packs of beer daily.  UDS was positive for methamphetamine and crack.  States she is here for rehab and placement.  Patient appears restless, fidgety and anxious.  Admission plan of care reviewed and consent signed.  Skin assessment and personal belongings completed.  Skin is dry and intact.  No contraband found.  Oriented patient to the unit, staff and room.  Routine safety checks initiated.  Verbalizes understanding of unit rules and protocols.  Patient is safe on the unit.

## 2019-09-20 NOTE — ED Notes (Signed)
Pt to the restroom

## 2019-09-20 NOTE — Tx Team (Signed)
Initial Treatment Plan 09/20/2019 3:09 PM [name] [id number]    PATIENT STRESSORS: Financial difficulties Health problems Marital or family conflict Substance abuse   PATIENT STRENGTHS: Ability for insight Average or above average intelligence Communication skills Motivation for treatment/growth   PATIENT IDENTIFIED PROBLEMS: Depression  Anxiety  Substance Abuse  Alcohol Abuse               DISCHARGE CRITERIA:  Ability to meet basic life and health needs Motivation to continue treatment in a less acute level of care  PRELIMINARY DISCHARGE PLAN: Attend aftercare/continuing care group Outpatient therapy Placement in alternative living arrangements  PATIENT/FAMILY INVOLVEMENT: This treatment plan has been presented to and reviewed with the patient, Alexandra Floyd, and/or family member.  The patient and family have been given the opportunity to ask questions and make suggestions.  Clarene Critchley, RN 09/20/2019, 3:09 PM

## 2019-09-21 DIAGNOSIS — F333 Major depressive disorder, recurrent, severe with psychotic symptoms: Secondary | ICD-10-CM

## 2019-09-21 DIAGNOSIS — F1410 Cocaine abuse, uncomplicated: Secondary | ICD-10-CM

## 2019-09-21 DIAGNOSIS — F1910 Other psychoactive substance abuse, uncomplicated: Secondary | ICD-10-CM

## 2019-09-21 DIAGNOSIS — F10229 Alcohol dependence with intoxication, unspecified: Secondary | ICD-10-CM | POA: Diagnosis present

## 2019-09-21 DIAGNOSIS — F1220 Cannabis dependence, uncomplicated: Secondary | ICD-10-CM | POA: Diagnosis present

## 2019-09-21 DIAGNOSIS — F14222 Cocaine dependence with intoxication with perceptual disturbance: Secondary | ICD-10-CM | POA: Diagnosis present

## 2019-09-21 DIAGNOSIS — F1994 Other psychoactive substance use, unspecified with psychoactive substance-induced mood disorder: Secondary | ICD-10-CM

## 2019-09-21 LAB — HEMOGLOBIN A1C
Hgb A1c MFr Bld: 5.6 % (ref 4.8–5.6)
Mean Plasma Glucose: 114.02 mg/dL

## 2019-09-21 LAB — LIPID PANEL
Cholesterol: 206 mg/dL — ABNORMAL HIGH (ref 0–200)
HDL: 42 mg/dL (ref >40)
LDL Cholesterol: 100 mg/dL — ABNORMAL HIGH (ref 0–99)
Total CHOL/HDL Ratio: 4.9 RATIO
Triglycerides: 321 mg/dL — ABNORMAL HIGH (ref <150)
VLDL: 64 mg/dL — ABNORMAL HIGH (ref 0–40)

## 2019-09-21 LAB — TSH: TSH: 5.131 u[IU]/mL — ABNORMAL HIGH (ref 0.350–4.500)

## 2019-09-21 NOTE — H&P (Addendum)
Psychiatric Admission Assessment Adult  Patient Identification: Alexandra Floyd MRN:  [id number] Date of Evaluation:  09/21/2019 Chief Complaint:  MDD (major depressive disorder), recurrent episode, severe (HCC) [F33.2] Principal Diagnosis: <principal problem not specified> Diagnosis:  Active Problems:   MDD (major depressive disorder), recurrent episode, severe (HCC)  History of Present Illness: Female 54 years old patient who look older than stated age was seen this morning in her room sleeping.  She was easily awaken and engaged in meaningful conversation.  She states that she came to the hospital to get help with Depression and substance use.  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  She states she is tired of using and want to go to a rehabilitation center.  She lives with a boy friend and his son who both are using same drugs.  She does not believe that going back to the house will be a good idea because she will relapse.  She came to the ER to avoid hurting herself or anybody else.  He he had taken 6 beers and crack Cocaine the evening she came to the ER. She called the Police to bring her to the hospital.  Today she reports that she has been in a rehabilitation facility in [facility] 4 years ago and that she has been in rehab couple other times.  She reports she has been on Psychotropic medications in the past and unable to name even one medication she had tried in the past.  She admits that she has never been compliant with her medications but uses Cocaine and Methamphetamine to treat self.  She drinks daily Beer 6 packs a day and she also drinks a medium size bottle wine daily.  BAL on arrival at the ER WAS 96.  She denies suicide ideation and states that last night was the best night sleep in months.   We discussed detox treatment using CIWA protocol USING  Ativan,.  Patient was encouraged to inform staff of any withdrawal symptoms.  Provider  reassured patient that Rehabilitation treatment request will be addressed with SW.  Once she is detoxed we will discuss best antidepressant that she can afford and tolerated by patient. Associated Signs/Symptoms: Depression Symptoms:  depressed mood, insomnia, difficulty concentrating, anxiety, panic attacks, loss of energy/fatigue, (Hypo) Manic Symptoms:  denies, none noted Anxiety Symptoms:  Excessive Worry, Psychotic Symptoms:  na PTSD Symptoms: NA Total Time spent with patient: 45 minutes  Past Psychiatric History: Multiple rehabilitation treatment/psychiatry treatment  in [facility]  Is the patient at risk to self? No.  Has the patient been a risk to self in the past 6 months? No.  Has the patient been a risk to self within the distant past? No.  Is the patient a risk to others? No.  Has the patient been a risk to others in the past 6 months? No.  Has the patient been a risk to others within the distant past? No.   Prior Inpatient Therapy:   Prior Outpatient Therapy:    Alcohol Screening: 1. How often do you have a drink containing alcohol?: 2 to 3 times a week 2. How many drinks containing alcohol do you have on a typical day when you are drinking?: 5 or 6 3. How often do you have six or more drinks on one occasion?: Daily or almost daily AUDIT-C Score: 9 4. How often during the last year have you found that you were not able to stop  drinking once you had started?: Less than monthly 5. How often during the last year have you failed to do what was normally expected from you because of drinking?: Less than monthly 6. How often during the last year have you needed a first drink in the morning to get yourself going after a heavy drinking session?: Less than monthly 7. How often during the last year have you had a feeling of guilt of remorse after drinking?: Never 8. How often during the last year have you been unable to remember what happened the night before because you  had been drinking?: Never 9. Have you or someone else been injured as a result of your drinking?: No 10. Has a relative or friend or a doctor or another health worker been concerned about your drinking or suggested you cut down?: No Alcohol Use Disorder Identification Test Final Score (AUDIT): 12 Alcohol Brief Interventions/Follow-up: Brief Advice, Alcohol Education Substance Abuse History in the last 12 months:  Yes.   Consequences of Substance Abuse: reports one DUI in the past, unable to remember date. Previous Psychotropic Medications: Yes , unable to name any Psychological Evaluations: No  Past Medical History:  Past Medical History:  Diagnosis Date  . Cellulitis   . Hep C w/ coma, chronic (HCC)     Past Surgical History:  Procedure Laterality Date  . arm sx    . CHOLECYSTECTOMY     Family History: History reviewed. No pertinent family history. Family Psychiatric  History: Strong family hx of Schizophrenia-Father, brother and sister Tobacco Screening: Have you used any form of tobacco in the last 30 days? (Cigarettes, Smokeless Tobacco, Cigars, and/or Pipes): Yes Tobacco use, Select all that apply: 5 or more cigarettes per day Are you interested in Tobacco Cessation Medications?: Yes, will notify MD for an order Counseled patient on smoking cessation including recognizing danger situations, developing coping skills and basic information about quitting provided: Yes Social History: Divorced once, lives with boyfriend and boyfriends son in [place].  Moved to [place] a year and half ago.  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 Social History   Substance and Sexual Activity  Alcohol Use Yes  . Alcohol/week: 42.0 standard drinks  . Types: 42 Cans of beer per week   Comment: social     Social History   Substance and Sexual Activity  Drug Use Yes  . Frequency: 4.0 times per week  . Types: Cocaine, Methamphetamines   Comment: Crack -smokes     Additional Social History:                           Allergies:   Allergies  Allergen Reactions  . Lasix [Furosemide] Rash   Lab Results:  Results for orders placed or performed during the hospital encounter of 09/19/19 (from the past 48 hour(s))  Rapid urine drug screen (hospital performed)     Status: Abnormal   Collection Time: 09/19/19 10:58 PM  Result Value Ref Range   Opiates NONE DETECTED NONE DETECTED   Cocaine POSITIVE (A) NONE DETECTED   Benzodiazepines NONE DETECTED NONE DETECTED   Amphetamines POSITIVE (A) NONE DETECTED   Tetrahydrocannabinol NONE DETECTED NONE DETECTED   Barbiturates NONE DETECTED NONE DETECTED    Comment: (NOTE) DRUG SCREEN FOR MEDICAL PURPOSES ONLY.  IF CONFIRMATION IS NEEDED FOR ANY PURPOSE, NOTIFY LAB WITHIN 5 DAYS.  LOWEST DETECTABLE LIMITS FOR URINE DRUG SCREEN Drug Class  Cutoff (ng/mL) Amphetamine and metabolites    1000 Barbiturate and metabolites    200 Benzodiazepine                 200 Tricyclics and metabolites     300 Opiates and metabolites        300 Cocaine and metabolites        300 THC                            50 Performed at [facility], [address] [place], [place] [address]   Comprehensive metabolic panel     Status: Abnormal   Collection Time: 09/19/19 11:23 PM  Result Value Ref Range   Sodium 137 135 - 145 mmol/L   Potassium 4.1 3.5 - 5.1 mmol/L   Chloride 106 98 - 111 mmol/L   CO2 19 (L) 22 - 32 mmol/L   Glucose, Bld 98 70 - 99 mg/dL    Comment: Glucose reference range applies only to samples taken after fasting for at least 8 hours.   BUN 11 6 - 20 mg/dL   Creatinine, Ser 3.66 0.44 - 1.00 mg/dL   Calcium 9.1 8.9 - 29.4 mg/dL   Total Protein 8.4 (H) 6.5 - 8.1 g/dL   Albumin 4.1 3.5 - 5.0 g/dL   AST 28 15 - 41 U/L   ALT 29 0 - 44 U/L   Alkaline Phosphatase 82 38 - 126 U/L   Total Bilirubin 0.2 (L) 0.3 - 1.2 mg/dL   GFR calc non Af Amer >60 >60 mL/min   GFR calc Af Amer  >60 >60 mL/min   Anion gap 12 5 - 15    Comment: Performed at [facility], [address], [place], [place] [address]  Ethanol     Status: Abnormal   Collection Time: 09/19/19 11:23 PM  Result Value Ref Range   Alcohol, Ethyl (B) 96 (H) <10 mg/dL    Comment: (NOTE) Lowest detectable limit for serum alcohol is 10 mg/dL.  For medical purposes only. Performed at [facility], [address], [place], [place] [address]   Salicylate level     Status: Abnormal   Collection Time: 09/19/19 11:23 PM  Result Value Ref Range   Salicylate Lvl <7.0 (L) 7.0 - 30.0 mg/dL    Comment: Performed at [facility], [address], [place], [place] [address]  Acetaminophen level     Status: Abnormal   Collection Time: 09/19/19 11:23 PM  Result Value Ref Range   Acetaminophen (Tylenol), Serum <10 (L) 10 - 30 ug/mL    Comment: (NOTE) Therapeutic concentrations vary significantly. A range of 10-30 ug/mL  may be an effective concentration for many patients. However, some  are best treated at concentrations outside of this range. Acetaminophen concentrations >150 ug/mL at 4 hours after ingestion  and >50 ug/mL at 12 hours after ingestion are often associated with  toxic reactions.  Performed at [facility], [address], [place], [place] [address]   cbc     Status: None   Collection Time: 09/19/19 11:23 PM  Result Value Ref Range   WBC 5.7 4.0 - 10.5 K/uL   RBC 4.14 3.87 - 5.11 MIL/uL   Hemoglobin 13.0 12.0 - 15.0 g/dL   HCT 00.1 36 - 46 %   MCV 96.6 80.0 - 100.0 fL   MCH 31.4 26.0 - 34.0 pg   MCHC 32.5 30.0 - 36.0 g/dL   RDW 13.0  11.5 - 15.5 %   Platelets 268 150 - 400 K/uL   nRBC 0.0 0.0 - 0.2 %    Comment: Performed at [facility], [address] [place], [place] [address]  Pregnancy, urine     Status: None   Collection Time: 09/20/19  2:32 AM  Result Value Ref Range   Preg Test, Ur NEGATIVE NEGATIVE    Comment:        THE SENSITIVITY OF THIS METHODOLOGY IS >20 mIU/mL. Performed at  [facility], [address] [place], [place] [address]   SARS Coronavirus 2 by RT PCR (hospital order, performed in [facility] hospital lab) Nasopharyngeal Nasopharyngeal Swab     Status: None   Collection Time: 09/20/19  9:39 AM   Specimen: Nasopharyngeal Swab  Result Value Ref Range   SARS Coronavirus 2 NEGATIVE NEGATIVE    Comment: (NOTE) SARS-CoV-2 target nucleic acids are NOT DETECTED.  The SARS-CoV-2 RNA is generally detectable in upper and lower respiratory specimens during the acute phase of infection. The lowest concentration of SARS-CoV-2 viral copies this assay can detect is 250 copies / mL. A negative result does not preclude SARS-CoV-2 infection and should not be used as the sole basis for treatment or other patient management decisions.  A negative result may occur with improper specimen collection / handling, submission of specimen other than nasopharyngeal swab, presence of viral mutation(s) within the areas targeted by this assay, and inadequate number of viral copies (<250 copies / mL). A negative result must be combined with clinical observations, patient history, and epidemiological information.  Fact Sheet for Patients:   BoilerBrush.com.cyhttps://www.fda.gov/media/136312/download  Fact Sheet for Healthcare Providers: https://pope.com/https://www.fda.gov/media/136313/download  This test is not yet approved or  cleared by the [place] FDA and has been authorized for detection and/or diagnosis of SARS-CoV-2 by FDA under an Emergency Use Authorization (EUA).  This EUA will remain in effect (meaning this test can be used) for the duration of the COVID-19 declaration under Section 564(b)(1) of the Act, 21 U.S.C. section 360bbb-3(b)(1), unless the authorization is terminated or revoked sooner.  Performed at [facility], [address] [place], [place] [address]     Blood Alcohol level:  Lab Results  Component Value Date   ETH 96 (H) 09/19/2019   ETH 92 (H) 09/20/2018    Metabolic  Disorder Labs:  No results found for: HGBA1C, MPG No results found for: PROLACTIN No results found for: CHOL, TRIG, HDL, CHOLHDL, VLDL, LDLCALC  Current Medications: Current Facility-Administered Medications  Medication Dose Route Frequency Provider Last Rate Last Admin  . acetaminophen (TYLENOL) tablet 650 mg  650 mg Oral Q6H PRN Maryagnes AmosStarkes-Perry, Takia S, FNP   650 mg at 09/21/19 [id number]  . alum & mag hydroxide-simeth (MAALOX/MYLANTA) 200-200-20 MG/5ML suspension 30 mL  30 mL Oral Q4H PRN Rosario AdieStarkes-Perry, Juel Burrowakia S, FNP      . hydrOXYzine (ATARAX/VISTARIL) tablet 25 mg  25 mg Oral Q6H PRN Gillermo Murdochhompson, Jacqueline, NP      . loperamide (IMODIUM) capsule 2-4 mg  2-4 mg Oral PRN Gillermo Murdochhompson, Jacqueline, NP      . LORazepam (ATIVAN) tablet 1 mg  1 mg Oral Q6H PRN Gillermo Murdochhompson, Jacqueline, NP   1 mg at 09/21/19 [id number]  . LORazepam (ATIVAN) tablet 1 mg  1 mg Oral QID Gillermo Murdochhompson, Jacqueline, NP   1 mg at 09/21/19 0957   Followed by  . [START ON 09/22/2019] LORazepam (ATIVAN) tablet 1 mg  1 mg Oral TID Gillermo Murdochhompson, Jacqueline, NP  Followed by  . [START ON 09/23/2019] LORazepam (ATIVAN) tablet 1 mg  1 mg Oral BID Gillermo Murdoch, NP       Followed by  . [START ON 09/25/2019] LORazepam (ATIVAN) tablet 1 mg  1 mg Oral Daily Gillermo Murdoch, NP      . magnesium hydroxide (MILK OF MAGNESIA) suspension 30 mL  30 mL Oral Daily PRN Starkes-Perry, Juel Burrow, FNP      . multivitamin with minerals tablet 1 tablet  1 tablet Oral Daily Gillermo Murdoch, NP   1 tablet at 09/21/19 0957  . ondansetron (ZOFRAN-ODT) disintegrating tablet 4 mg  4 mg Oral Q6H PRN Gillermo Murdoch, NP      . thiamine tablet 100 mg  100 mg Oral Daily Gillermo Murdoch, NP   100 mg at 09/21/19 0957  . traZODone (DESYREL) tablet 100 mg  100 mg Oral QHS PRN Maryagnes Amos, FNP   100 mg at 09/20/19 2127   PTA Medications: No medications prior to admission.    Musculoskeletal: Strength & Muscle Tone: within normal limits Gait &  Station: normal Patient leans: N/A  Psychiatric Specialty Exam: Physical Exam  Review of Systems  Blood pressure 120/85, pulse 97, temperature 98.2 F (36.8 C), temperature source Oral, resp. rate 18, height  (1.6 m), weight 80.7 kg, SpO2 100 %.Body mass index is 31.53 kg/m.  General Appearance: Casual and Disheveled  Eye Contact:  Minimal  Speech:  Clear and Coherent  Volume:  Normal  Mood:  Anxious and Depressed  Affect:  Congruent  Thought Process:  Coherent and Goal Directed  Orientation:  Full (Time, Place, and Person)  Thought Content:  Logical  Suicidal Thoughts:  No  Homicidal Thoughts:  No  Memory:  Immediate;   Fair Recent;   Fair Remote;   Fair  Judgement:  Intact  Insight:  Good  Psychomotor Activity:  Normal  Concentration:  Concentration: Fair and Attention Span: Fair  Recall:  [employer] of Knowledge:  Fair  Language:  Good  Akathisia:  No  Handed:  Right  AIMS (if indicated):     Assets:  Communication Skills Desire for Improvement Housing Intimacy Leisure Time  ADL's:  Intact  Cognition:  WNL  Sleep:  Number of Hours: 6.5    Treatment Plan Summary: Daily contact with patient to assess and evaluate symptoms and progress in treatment, Medication management and Plan Continue Detox treatment using CIWA score with Ativan.  Monitor for Alcohol withdrawal seizures and treat accordingly Will consider affordable and safe antidepressant as she is safely detoxed. SW to assist with rehabilitation facility around Encourage group participation.  Observation Level/Precautions:  Elopement Continuous Observation Detox Fall Seizure  Laboratory:  Lab results WNL except Alcohol level 92,UDS positive for Amphetamines, Cocaine  Psychotherapy:  Encourage participation  Medications:  See MAR  Consultations:  NA  Discharge Concerns:  Requires Rehab facility   Estimated LOS: 3-5 days  Other:     Physician Treatment Plan for Primary Diagnosis: <principal  problem not specified> Long Term Goal(s): Improvement in symptoms so as ready for discharge  Short Term Goals: Ability to identify changes in lifestyle to reduce recurrence of condition will improve, Ability to verbalize feelings will improve, Ability to disclose and discuss suicidal ideas, Ability to demonstrate self-control will improve, Ability to identify and develop effective coping behaviors will improve, Ability to maintain clinical measurements within normal limits will improve, Compliance with prescribed medications will improve and Ability to identify triggers associated with substance abuse/mental  health issues will improve  Physician Treatment Plan for Secondary Diagnosis: Active Problems:   MDD (major depressive disorder), recurrent episode, severe (HCC)  Long Term Goal(s): Improvement in symptoms so as ready for discharge  Short Term Goals: Ability to identify changes in lifestyle to reduce recurrence of condition will improve, Ability to verbalize feelings will improve, Ability to disclose and discuss suicidal ideas, Ability to demonstrate self-control will improve, Ability to identify and develop effective coping behaviors will improve, Ability to maintain clinical measurements within normal limits will improve, Compliance with prescribed medications will improve and Ability to identify triggers associated with substance abuse/mental health issues will improve  I certify that inpatient services furnished can reasonably be expected to improve the patient's condition.    Earney Navy, NP 8/21/202111:19 AM

## 2019-09-21 NOTE — Progress Notes (Signed)
Pt said her goal is to work on the "next step" in staying sober by attending rehab. Pt mentioned that the relationship that she was in with her boyfriend is getting "wrecked." Pt appears really anxious and fidgety during the assessment. Her PRN vistaril and trazadone were administered at 2127 and were effective. Pt is currently resting in bed with no distress. Pt denies SI/HI and AVH. Active listening, reassurance, and support provided. Q 15 min safety checks continue. Pt's safety has been maintained.   09/20/19 1955  Psych Admission Type (Psych Patients Only)  Admission Status Voluntary  Psychosocial Assessment  Patient Complaints Anxiety;Depression;Sadness;Worrying  Eye Contact Brief  Facial Expression Animated;Wide-eyed;Anxious  Affect Anxious;Depressed  Speech Press photographer;Restless  Appearance/Hygiene Disheveled  Behavior Characteristics Cooperative;Anxious;Fidgety  Mood Depressed;Anxious;Sad;Pleasant  Thought Process  Coherency WDL  Content Blaming others  Delusions None reported or observed  Perception WDL  Hallucination None reported or observed  Judgment Impaired  Confusion None  Danger to Self  Current suicidal ideation? Denies  Danger to Others  Danger to Others None reported or observed

## 2019-09-21 NOTE — Progress Notes (Signed)
   09/21/19 2000  COVID-19 Daily Checkoff  Have you had a fever (temp > 37.80C/100F)  in the past 24 hours?  No  COVID-19 EXPOSURE  Have you traveled outside the state in the past 14 days? No  Have you been in contact with someone with a confirmed diagnosis of COVID-19 or PUI in the past 14 days without wearing appropriate PPE? No  Have you been living in the same home as a person with confirmed diagnosis of COVID-19 or a PUI (household contact)? No  Have you been diagnosed with COVID-19? No

## 2019-09-21 NOTE — [facility] Group Notes (Signed)
Adult Psychoeducational Group Note  Date:  09/21/2019 Time:  11:48 AM  Group Topic/Focus:  Goals Group:   The focus of this group is to help patients establish daily goals to achieve during treatment and discuss how the patient can incorporate goal setting into their daily lives to aide in recovery.  Participation Level:  Did Not Attend Alexandra Floyd 09/21/2019, 11:48 AM

## 2019-09-21 NOTE — Progress Notes (Signed)
Pt presents with a flat affect and depressed mood. She reports ongoing depression and anxiety today. She denies SI/HI. She reports withdrawal symptoms of sweats, shakes and nausea. She was administered ativan per detox protocol. The pt was isolative to her room and had minimal interaction on the unit this morning. This evening, the pt reports that the Ativan is helping her withdrawal symptoms and reported feeling "a lot better." The pt asked if she would be given Ativan when she discharges. The pt was informed that the Ativan is apart of a protocol and each day the scheduled amount decreases. The pt verbalized understanding.   Orders reviewed with the pt. Vital signs reviewed. Pt encouraged to attend groups as tolerated. 15 minute checks performed for safety.  Pt compliant with taking medications and denies having any medication side effects.

## 2019-09-21 NOTE — BHH Suicide Risk Assessment (Signed)
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  She was easily awaken and engaged in meaningful conversation.  She states that she came to the hospital to get help with Depression and substance use.  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  She states she is tired of using and want to go to a rehabilitation center.  She lives with a boy friend and his son who both are using same drugs.  She does not believe that going back to the house will be a good idea because she will relapse.  She came to the ER to avoid hurting herself or anybody else.  She had taken 6 beers and crack Cocaine the evening she came to the ER. She called the Police to bring her to the hospital.  Today she reports that she has been in a rehabilitation facility in [facility] 4 years ago and that she has been in rehab couple other times.  She reports she has been on Psychotropic medications in the past and unable to name even one medication she had tried in the past.  She admits that  she has never been compliant with her medications but uses Cocaine and Methamphetamine to treat self.  She drinks daily Beer 6 packs a day and she also drinks a medium size bottle wine daily.  BAL on arrival at the ER WAS 96.  She denies suicide ideation and states that last night was the best night sleep in months.   We discussed detox treatment using CIWA protocol USING  Ativan,.  Patient was encouraged to inform staff of any withdrawal symptoms.  Provider reassured patient that Rehabilitation treatment request will be addressed with SW.  Once she is detoxed we will discuss best antidepressant that she can afford and tolerated by patient. Associated Signs/Symptoms: Depression Symptoms:  depressed mood, insomnia, difficulty concentrating, anxiety, panic attacks, loss of energy/fatigue, (Hypo) Manic Symptoms:  denies, none noted Anxiety Symptoms:  Excessive Worry, Psychotic Symptoms:  na PTSD Symptoms: NA  Past Psychiatric History: Multiple rehabilitation treatment/psychiatry treatment  in [facility] [place]  Is the patient at risk to self? No.  Has the patient been a risk to self in the past 6 months? No.  Has the patient been a risk to self within the distant past? No.  Is the patient a risk to others? No.  Has the patient been a risk to others in the past 6 months? No.  Has the patient been a risk to others within the distant past? No.     Continued  Clinical Symptoms:  Alcohol Use Disorder Identification Test Final Score (AUDIT): 12 The "Alcohol Use Disorders Identification Test", Guidelines for Use in Primary Care, Second Edition.  World Science writer [facility]). Score between 0-7:  no or low risk or alcohol related problems. Score between 8-15:  moderate risk of alcohol related problems. Score between 16-19:  high risk of alcohol related problems. Score 20 or above:  warrants further diagnostic evaluation for alcohol dependence and treatment.   CLINICAL FACTORS:   Severe  Anxiety and/or Agitation Dysthymia Alcohol/Substance Abuse/Dependencies   Musculoskeletal: Strength & Muscle Tone: within normal limits Gait & Station: normal Patient leans: N/A  Psychiatric Specialty Exam: Physical Exam  Review of Systems  Blood pressure 120/85, pulse 97, temperature 98.2 F (36.8 C), temperature source Oral, resp. rate 18, height 5\' 3"  (1.6 m), weight 80.7 kg, SpO2 100 %.Body mass index is 31.53 kg/m.  General Appearance: Casual and Disheveled  Eye Contact:  Minimal  Speech:  Clear and Coherent  Volume:  Normal  Mood:  Anxious and Depressed  Affect:  Congruent  Thought Process:  Coherent and Goal Directed  Orientation:  Full (Time, Place, and Person)  Thought Content:  Logical  Suicidal Thoughts:  No  Homicidal Thoughts:  No  Memory:  Immediate;   Fair Recent;   Fair Remote;   Fair  Judgement:  Intact  Insight:  Good  Psychomotor Activity:  Normal  Concentration:  Concentration: Fair and Attention Span: Fair  Recall:  of Knowledge:  Fair  Language:  Good  Akathisia:  No  Handed:  Right  AIMS (if indicated):     Assets:  Communication Skills Desire for Improvement Housing Intimacy Leisure Time  ADL's:  Intact  Cognition:  WNL  Sleep:  Number of Hours: 6.5    COGNITIVE FEATURES THAT CONTRIBUTE TO RISK:  Loss of executive function and Thought constriction (tunnel vision)    SUICIDE RISK:   Moderate:  Frequent suicidal ideation with limited intensity, and duration, some specificity in terms of plans, no associated intent, good self-control, limited dysphoria/symptomatology, some risk factors present, and identifiable protective factors, including available and accessible social support.  PLAN OF CARE:  Daily contact with patient to assess and evaluate symptoms and progress in treatment, Medication management and Plan Continue Detox treatment using CIWA score with Ativan.  Monitor for Alcohol withdrawal seizures and treat  accordingly Will consider affordable and safe antidepressant as she is safely detoxed. SW to assist with rehabilitation facility around Encourage group participation.  Observation Level/Precautions:  Elopement Continuous Observation Detox Fall Seizure  Laboratory:  Lab results WNL except Alcohol level 92,UDS positive for Amphetamines, Cocaine  Psychotherapy:  Encourage participation  Medications:  See MAR  Consultations:  NA  Discharge Concerns:  Requires Rehab facility   Estimated LOS: 3-5 days  Other:  goal sustained remission     I certify that inpatient services furnished can reasonably be expected to improve the patient's condition.   [employer], MD 09/21/2019, 3:41 PM

## 2019-09-22 MED ORDER — NICOTINE 21 MG/24HR TD PT24
21.0000 mg | MEDICATED_PATCH | Freq: Every day | TRANSDERMAL | Status: DC
  Administered 2019-09-22 – 2019-09-25 (×4): 21 mg via TRANSDERMAL
  Filled 2019-09-22 (×6): qty 1

## 2019-09-22 MED ORDER — DULOXETINE HCL 30 MG PO CPEP
30.0000 mg | ORAL_CAPSULE | Freq: Every day | ORAL | Status: DC
  Administered 2019-09-23 – 2019-09-25 (×3): 30 mg via ORAL
  Filled 2019-09-22 (×4): qty 1

## 2019-09-22 MED ORDER — AMANTADINE HCL 100 MG PO CAPS
100.0000 mg | ORAL_CAPSULE | Freq: Two times a day (BID) | ORAL | Status: DC
  Administered 2019-09-22 – 2019-09-25 (×6): 100 mg via ORAL
  Filled 2019-09-22 (×9): qty 1

## 2019-09-22 NOTE — Progress Notes (Signed)
[facility] MD Progress Note  09/22/2019 2:43 PM Alexandra Floyd  MRN:  [id number] Subjective:  " I am still interested in going to a Rehabilitation unit for a month" Patient was met in her room today.  She reports less withdrawal discomforts.  She attended group activity this morning.  She complained of leg pain and received Tylenol. She still complains of low energy and is worried about being accepted at a rehab facility.  She is also worried about going back to where she came from and relapsing since her boyfriend and his son are using these drugs she is trying to stop taking.  We discussed starting her on antidepressant for depression and anxiety.  We arrived at using [place] since it will take care of her depression and anxiety and her chronic leg pain. 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 Cellulitis   . Hep C w/ coma, chronic (HCC)     Past Surgical History:  Procedure Laterality Date  . arm sx    . CHOLECYSTECTOMY     Family History: History reviewed. No pertinent family history. Family Psychiatric  History: Strong family hx of Schizophrenia-Father, brother and sister Social History:  Social History   Substance and Sexual Activity  Alcohol Use Yes  . Alcohol/week: 42.0 standard  drinks  . Types: 42 Cans of beer per week   Comment: social     Social History   Substance and Sexual Activity  Drug Use Yes  . Frequency: 4.0 times per week  . Types: Cocaine, Methamphetamines   Comment: Crack -smokes    Social History   Socioeconomic History  . Marital status: Legally Separated    Spouse name: Not on file  . Number of children: Not on file  . Years of education: Not on file  . Highest education level: Not on file  Occupational History  . Not on file  Tobacco Use  . Smoking status: Current Every Day Smoker    Packs/day: 0.50    Types: Cigarettes  . Smokeless tobacco: Never Used  Vaping Use  . Vaping Use: Never used  Substance and Sexual Activity  . Alcohol use: Yes    Alcohol/week: 42.0 standard drinks    Types: 42 Cans of beer per week    Comment: social  . Drug use: Yes    Frequency: 4.0 times per week    Types: Cocaine, Methamphetamines    Comment: Crack -smokes  . Sexual activity: Yes  Other Topics Concern  . Not on file  Social History Narrative  . Not on file   Social Determinants of Health   Financial Resource Strain:   . Difficulty of Paying Living Expenses: Not on file  Food Insecurity:   . Worried About Charity fundraiser in the Last Year: Not on file  . Ran Out of Food in the Last Year: Not on file  Transportation  Needs:   . Lack of Transportation (Medical): Not on file  . Lack of Transportation (Non-Medical): Not on file  Physical Activity:   . Days of Exercise per Week: Not on file  . Minutes of Exercise per Session: Not on file  Stress:   . Feeling of Stress : Not on file  Social Connections:   . Frequency of Communication with Friends and Family: Not on file  . Frequency of Social Gatherings with Friends and Family: Not on file  . Attends Religious Services: Not on file  . Active Member of Clubs or Organizations: Not on file  . Attends Archivist Meetings: Not on file  . Marital Status: Not on file    Additional Social History:                         Sleep: Good  Appetite:  Good  Current Medications: Current Facility-Administered Medications  Medication Dose Route Frequency Provider Last Rate Last Admin  . acetaminophen (TYLENOL) tablet 650 mg  650 mg Oral Q6H PRN Suella Broad, FNP   650 mg at 09/22/19 0924  . alum & mag hydroxide-simeth (MAALOX/MYLANTA) 200-200-20 MG/5ML suspension 30 mL  30 mL Oral Q4H PRN Burt Ek, Gayland Curry, FNP      . hydrOXYzine (ATARAX/VISTARIL) tablet 25 mg  25 mg Oral Q6H PRN Caroline Sauger, NP      . loperamide (IMODIUM) capsule 2-4 mg  2-4 mg Oral PRN Caroline Sauger, NP      . LORazepam (ATIVAN) tablet 1 mg  1 mg Oral Q6H PRN Caroline Sauger, NP   1 mg at 09/21/19 0277  . LORazepam (ATIVAN) tablet 1 mg  1 mg Oral TID Caroline Sauger, NP   1 mg at 09/22/19 1146   Followed by  . [START ON 09/23/2019] LORazepam (ATIVAN) tablet 1 mg  1 mg Oral BID Caroline Sauger, NP       Followed by  . [START ON 09/25/2019] LORazepam (ATIVAN) tablet 1 mg  1 mg Oral Daily Caroline Sauger, NP      . magnesium hydroxide (MILK OF MAGNESIA) suspension 30 mL  30 mL Oral Daily PRN Starkes-Perry, Gayland Curry, FNP      . multivitamin with minerals tablet 1 tablet  1 tablet Oral Daily Caroline Sauger, NP   1 tablet at 09/22/19 0923  . nicotine (NICODERM CQ - dosed in mg/24 hours) patch 21 mg  21 mg Transdermal Daily Hertha Gergen C, NP   21 mg at 09/22/19 1027  . ondansetron (ZOFRAN-ODT) disintegrating tablet 4 mg  4 mg Oral Q6H PRN Caroline Sauger, NP      . thiamine tablet 100 mg  100 mg Oral Daily Caroline Sauger, NP   100 mg at 09/22/19 4128  . traZODone (DESYREL) tablet 100 mg  100 mg Oral QHS PRN Suella Broad, FNP   100 mg at 09/21/19 2109    Lab Results:  Results for orders placed or performed during the hospital encounter of 09/20/19 (from the past 48 hour(s))  Hemoglobin A1c     Status: None    Collection Time: 09/21/19  5:47 PM  Result Value Ref Range   Hgb A1c MFr Bld 5.6 4.8 - 5.6 %    Comment: (NOTE) Pre diabetes:          5.7%-6.4%  Diabetes:              >6.4%  Glycemic control for   <7.0% adults with  diabetes    Mean Plasma Glucose 114.02 mg/dL    Comment: Performed at [facility] [address], [place], [place] [address]  Lipid panel     Status: Abnormal   Collection Time: 09/21/19  5:47 PM  Result Value Ref Range   Cholesterol 206 (H) 0 - 200 mg/dL   Triglycerides 321 (H) <150 mg/dL   HDL 42 >40 mg/dL   Total CHOL/HDL Ratio 4.9 RATIO   VLDL 64 (H) 0 - 40 mg/dL   LDL Cholesterol 100 (H) 0 - 99 mg/dL    Comment:        Total Cholesterol/HDL:CHD Risk Coronary Heart Disease Risk Table                     Men   Women  1/2 Average Risk   3.4   3.3  Average Risk       5.0   4.4  2 X Average Risk   9.6   7.1  3 X Average Risk  23.4   11.0        Use the calculated Patient Ratio above and the CHD Risk Table to determine the patient's CHD Risk.        ATP III CLASSIFICATION (LDL):  <100     mg/dL   Optimal  100-129  mg/dL   Near or Above                    Optimal  130-159  mg/dL   Borderline  160-189  mg/dL   High  >190     mg/dL   Very High Performed at [facility] [address] [place], [place] [address]   TSH     Status: Abnormal   Collection Time: 09/21/19  5:47 PM  Result Value Ref Range   TSH 5.131 (H) 0.350 - 4.500 uIU/mL    Comment: Performed by a 3rd Generation assay with a functional sensitivity of <=0.01 uIU/mL. Performed at [facility], [place] [address] [place],  [address]     Blood Alcohol level:  Lab Results  Component Value Date   ETH 96 (H) 09/19/2019   ETH 92 (H) 83/38/2505    Metabolic Disorder Labs: Lab Results  Component Value Date   HGBA1C 5.6 09/21/2019   MPG 114.02 09/21/2019   No results found for: PROLACTIN Lab Results  Component Value Date   CHOL 206 (H)  09/21/2019   TRIG 321 (H) 09/21/2019   HDL 42 09/21/2019   CHOLHDL 4.9 09/21/2019   VLDL 64 (H) 09/21/2019   LDLCALC 100 (H) 09/21/2019    Physical Findings: AIMS: Facial and Oral Movements Muscles of Facial Expression: None, normal Lips and Perioral Area: None, normal Jaw: None, normal Tongue: None, normal,Extremity Movements Upper (arms, wrists, hands, fingers): None, normal Lower (legs, knees, ankles, toes): None, normal, Trunk Movements Neck, shoulders, hips: None, normal, Overall Severity Severity of abnormal movements (highest score from questions above): None, normal Incapacitation due to abnormal movements: None, normal Patient's awareness of abnormal movements (rate only patient's report): No Awareness, Dental Status Current problems with teeth and/or dentures?: No Does patient usually wear dentures?: No  CIWA:  CIWA-Ar Total: 4 COWS:  COWS Total Score: 7  Musculoskeletal: Strength & Muscle Tone: within normal limits Gait & Station: normal Patient leans: N/A  Psychiatric Specialty Exam: Physical Exam Vitals and nursing note reviewed.  Constitutional:      Appearance: Normal appearance.  HENT:  Head: Normocephalic and atraumatic.  Cardiovascular:     Rate and Rhythm: Normal rate.  Pulmonary:     Effort: Pulmonary effort is normal.  Musculoskeletal:        General: Normal range of motion.     Cervical back: Normal range of motion.  Skin:    General: Skin is warm.  Neurological:     General: No focal deficit present.     Mental Status: She is alert.     Review of Systems  Constitutional: Negative.   HENT: Negative.   Eyes: Negative.   Respiratory: Negative.   Cardiovascular: Negative.   Gastrointestinal: Negative.   Endocrine: Negative.   Genitourinary: Negative.   Musculoskeletal: Negative.   Allergic/Immunologic: Negative.   Neurological: Negative.   Hematological: Negative.     Blood pressure (!) 116/91, pulse 91, temperature 97.9 F (36.6  C), temperature source Oral, resp. rate 20, height '5\' 3"'  (1.6 m), weight 80.7 kg, SpO2 100 %.Body mass index is 31.53 kg/m.  General Appearance: Casual and Disheveled  Eye Contact:  Good  Speech:  Clear and Coherent  Volume:  Normal  Mood:  Anxious and Depressed  Affect:  Congruent  Thought Process:  Coherent  Orientation:  Full (Time, Place, and Person)  Thought Content:  Logical  Suicidal Thoughts:  No  Homicidal Thoughts:  No  Memory:  Immediate;   Good Recent;   Good Remote;   Good  Judgement:  Good  Insight:  Good  Psychomotor Activity:  Normal  Concentration:  Concentration: Good and Attention Span: Good  Recall:  Good  Fund of Knowledge:  Good  Language:  Good  Akathisia:  NA  Handed:  Right  AIMS (if indicated):     Assets:  Communication Skills Desire for Improvement Housing Resilience  ADL's:  Impaired  Cognition:  WNL  Sleep:  Number of Hours: 6.5     Treatment Plan Summary: Daily contact with patient to assess and evaluate symptoms and progress in treatment and Medication management  Start Cymbalta 30 mg po daily for depression/Anxiety and pain Continue CIWA assessment with Ativan coverage. Start Amantadine 100 mg po bid for Cocaine Craving Delfin Gant, NP 09/22/2019, 2:43 PM

## 2019-09-22 NOTE — Progress Notes (Signed)
Pt remains isolative to her room and resting in bed. Pt encouraged to attend groups and interact with the other pts in the milieu. Pt does report feeling better compared to yesterday and that the Ativan has been helping her with withdrawal symptoms. Pt denies SI/HI and AVH. Active listening, reassurance, and support provided. Q 15 min safety checks continue. Pt's safety has been maintained.     09/21/19 2000  Psych Admission Type (Psych Patients Only)  Admission Status Voluntary  Psychosocial Assessment  Patient Complaints Anxiety;Depression;Substance abuse  Eye Contact Brief  Facial Expression Anxious  Affect Anxious;Depressed  Speech Logical/coherent  Interaction Assertive;Isolative  Motor Activity Fidgety  Appearance/Hygiene Disheveled  Behavior Characteristics Appropriate to situation;Cooperative;Anxious;Calm  Mood Depressed;Anxious;Pleasant  Thought Process  Coherency WDL  Content WDL  Delusions None reported or observed  Perception WDL  Hallucination None reported or observed  Judgment Impaired  Confusion None  Danger to Self  Current suicidal ideation? Denies  Danger to Others  Danger to Others None reported or observed

## 2019-09-22 NOTE — [facility] Group Notes (Signed)
Date:  09/22/2019 Time:  9:00am-9:45am  Group Topic/Focus: PROGRESSIVE RELAXATION. A group where deep breathing is taught and tensing and relaxation muscle groups is used. Imagery is used as well.  Pts are asked to imagine 3 pillars that hold them up when they are not able to hold themselves up.  Participation Level:  Active  Participation Quality:  Lacking  Affect:  Sad depressed  Cognitive:  Oriented  Insight: lacking  Engagement in Group:  Engaged  Modes of Intervention:  Activity, Discussion, Education, and Support  Additional Comments:  Pt rates her energy at 0/10. States the 3 pillars that hold her up are, her kindness, her forgiveness and her honesty Dione Housekeeper 09/22/2019

## 2019-09-22 NOTE — Progress Notes (Signed)
Patient presents with an animated affect and anxious mood. She denies SI/HI. She reports ongoing depression and anxiety. She reports withdrawal symptoms of anxiety, body aches, sweats, and irritability. She denied AVH or tactile hallucinations. She reports poor sleep last night but did not elaborate on why she did not sleep well.   Orders reviewed. Vital signs reviewed. Verbal support provided. 15 minute checks performed for safety.    Patient compliant with taking medications and denies any medication side effects. She was observed attending morning groups after encouragement by staff.
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
## 2019-09-22 NOTE — Progress Notes (Signed)
   09/22/19 2045  Psych Admission Type (Psych Patients Only)  Admission Status Voluntary  Psychosocial Assessment  Patient Complaints Anxiety;Depression  Eye Contact Brief  Facial Expression Anxious  Affect Anxious;Depressed  Speech Logical/coherent  Interaction Forwards little  Motor Activity Slow  Appearance/Hygiene Unremarkable  Behavior Characteristics Cooperative  Mood Depressed;Anxious;Pleasant  Thought Process  Coherency WDL  Content WDL  Delusions None reported or observed  Perception WDL  Hallucination None reported or observed  Judgment Impaired  Confusion None  Danger to Self  Current suicidal ideation? Denies  Danger to Others  Danger to Others None reported or observed   Pt endorses pain 7-8/10 in both legs; anxiety 6/10 and depression 3/10. Pt CIWA at assessment =3.

## 2019-09-23 DIAGNOSIS — F10229 Alcohol dependence with intoxication, unspecified: Secondary | ICD-10-CM

## 2019-09-23 DIAGNOSIS — F14222 Cocaine dependence with intoxication with perceptual disturbance: Secondary | ICD-10-CM

## 2019-09-23 NOTE — Progress Notes (Signed)
ADULT GRIEF GROUP NOTE:  Spiritual care group on grief and loss facilitated by chaplain Burnis Kingfisher  Group Goal:  Support / Education around grief and loss Members engage in facilitated group support and psycho-social education.  Group Description:  Following introductions and group rules, group members engaged in facilitated group dialog and support around topic of loss, with particular support around experiences of loss in their lives. Group Identified types of loss (relationships / self / things) and identified patterns, circumstances, and changes that precipitate losses. Reflected on thoughts / feelings around loss, normalized grief responses, and recognized variety in grief experience. Patient Progress:   Alexandra Floyd was present at beginning of group.  Asked for clarification about whether group is required.  Left group and did not return.

## 2019-09-23 NOTE — Tx Team (Signed)
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 acetaminophen (TYLENOL) tablet 650 mg  650 mg Oral Q6H PRN Starkes-Perry, Takia S, FNP   650 mg at 09/23/19 0822  . alum & mag hydroxide-simeth (MAALOX/MYLANTA) 200-200-20 MG/5ML suspension 30 mL  30 mL Oral Q4H PRN Starkes-Perry, Takia S, FNP      . amantadine (SYMMETREL) capsule 100 mg  100 mg Oral BID Onuoha, Josephine C, NP   100 mg at 09/23/19 0820  . DULoxetine (CYMBALTA) DR capsule 30 mg  30 mg Oral Daily Onuoha, Josephine C, NP   30 mg at 09/23/19 0820  . hydrOXYzine (ATARAX/VISTARIL) tablet 25 mg  25 mg Oral Q6H PRN Thompson, Jacqueline, NP   25 mg at 09/22/19 2111  . loperamide (IMODIUM) capsule 2-4 mg  2-4 mg Oral PRN Thompson, Jacqueline, NP      . LORazepam (ATIVAN) tablet 1 mg  1 mg Oral Q6H PRN Thompson, Jacqueline, NP   1 mg at 09/21/19 0611  . LORazepam (ATIVAN) tablet 1 mg  1 mg Oral BID Thompson, Jacqueline, NP       Followed by  . [START ON 09/25/2019] LORazepam (ATIVAN) tablet 1 mg  1 mg Oral Daily Thompson, Jacqueline, NP      . magnesium hydroxide (MILK OF MAGNESIA) suspension 30 mL  30 mL Oral Daily PRN Starkes-Perry, Takia S, FNP      . multivitamin with minerals tablet 1 tablet  1 tablet  Oral Daily Thompson, Jacqueline, NP   1 tablet at 09/23/19 0820  . nicotine (NICODERM CQ - dosed in mg/24 hours) patch 21 mg  21 mg Transdermal Daily Onuoha, Josephine C, NP   21 mg at 09/23/19 0823  . ondansetron (ZOFRAN-ODT) disintegrating tablet 4 mg  4 mg Oral Q6H PRN Thompson, Jacqueline, NP      . thiamine tablet 100 mg  100 mg Oral Daily Thompson, Jacqueline, NP   100 mg at 09/23/19 0820  . traZODone (DESYREL) tablet 100 mg  100 mg Oral QHS PRN Starkes-Perry, Takia S, FNP   100 mg at 09/22/19 2111   PTA Medications: No medications prior to admission.    Patient Stressors: Financial difficulties Health problems Marital or family conflict Substance abuse  Patient Strengths: Ability for insight Average or above average intelligence Communication skills Motivation for treatment/growth  Treatment Modalities: Medication Management, Group therapy, Case management,  1 to 1 session with clinician, Psychoeducation, Recreational therapy.   Physician Treatment Plan for Primary Diagnosis: MDD (major depressive disorder), recurrent episode, severe (HCC) Long Term Goal(s): Improvement in symptoms so as ready for discharge Improvement in symptoms so as ready for discharge   Short Term Goals: Ability to identify changes in lifestyle to reduce recurrence of condition will improve Ability to verbalize feelings will improve Ability to disclose and discuss suicidal ideas Ability to demonstrate self-control will improve Ability to identify and   develop effective coping behaviors will improve Ability to maintain clinical measurements within normal limits will improve Compliance with prescribed medications will improve Ability to identify triggers associated with substance abuse/mental health issues will improve Ability to identify changes in lifestyle to reduce recurrence of condition will improve Ability to verbalize feelings will improve Ability to disclose and discuss suicidal ideas Ability  to demonstrate self-control will improve Ability to identify and develop effective coping behaviors will improve Ability to maintain clinical measurements within normal limits will improve Compliance with prescribed medications will improve Ability to identify triggers associated with substance abuse/mental health issues will improve  Medication Management: Evaluate patient's response, side effects, and tolerance of medication regimen.  Therapeutic Interventions: 1 to 1 sessions, Unit Group sessions and Medication administration.  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)   Marinol use disorder, severe, dependence ([place])  Long Term Goal(s): Improvement in symptoms so as ready for discharge Improvement in symptoms so as ready for discharge   Short Term Goals: Ability to identify changes in lifestyle to reduce recurrence of condition will improve Ability to verbalize feelings will improve Ability to disclose and discuss suicidal ideas Ability to demonstrate self-control will improve Ability to identify and develop effective coping behaviors will improve Ability to maintain clinical measurements within normal limits will improve Compliance with prescribed medications will improve Ability to identify triggers associated with substance abuse/mental health issues will improve Ability to identify changes in lifestyle to reduce recurrence of condition will improve Ability to verbalize feelings will improve Ability to disclose and discuss suicidal ideas Ability to demonstrate  self-control will improve Ability to identify and develop effective coping behaviors will improve Ability to maintain clinical measurements within normal limits will improve Compliance with prescribed medications will improve Ability to identify triggers associated with substance abuse/mental health issues will improve     Medication Management: Evaluate patient's response, side effects, and tolerance of medication regimen.  Therapeutic Interventions: 1 to 1 sessions, Unit Group sessions and Medication administration.  Evaluation of Outcomes: Not Met   RN Treatment Plan for Primary Diagnosis: MDD (major depressive disorder), recurrent episode, severe ([place]) Long Term Goal(s): Knowledge of disease and therapeutic regimen to maintain health will improve  Short Term Goals: Ability to remain free from injury will improve, Ability to verbalize frustration and anger appropriately will improve, Ability to identify and develop effective coping behaviors will improve and Compliance with prescribed medications will improve  Medication Management: RN will administer medications as ordered by provider, will assess and evaluate patient's response and provide education to patient for prescribed medication. RN will report any adverse and/or side effects to prescribing provider.  Therapeutic Interventions: 1 on 1 counseling sessions, Psychoeducation, Medication administration, Evaluate responses to treatment, Monitor vital signs and CBGs as ordered, Perform/monitor CIWA, COWS, AIMS and Fall Risk screenings as ordered, Perform wound care treatments as ordered.  Evaluation of Outcomes: Not Met   LCSW Treatment Plan for Primary Diagnosis: MDD (major depressive disorder), recurrent episode, severe ([place]) Long Term Goal(s): Safe transition to appropriate next level of care at discharge, Engage patient in therapeutic group addressing interpersonal concerns.  Short Term  Goals: Engage patient in aftercare planning  with referrals and resources, Increase social support, Facilitate patient progression through stages of change regarding substance use diagnoses and concerns, Identify triggers associated with mental health/substance abuse issues and Increase skills for wellness and recovery  Therapeutic Interventions: Assess for all discharge needs, 1 to 1 time with Social worker, Explore available resources and support systems, Assess for adequacy in community support network, Educate family and significant other(s) on suicide prevention, Complete Psychosocial Assessment, Interpersonal group therapy.  Evaluation of Outcomes: Not Met   Progress in Treatment: Attending groups: No. Participating in groups: No. Taking medication as prescribed: Yes. Toleration medication: Yes. Family/Significant other contact made: No, will contact:  boyfriend. Patient understands diagnosis: Yes. Discussing patient identified problems/goals with staff: Yes. Medical problems stabilized or resolved: Yes. Denies suicidal/homicidal ideation: Yes. Issues/concerns per patient self-inventory: No.   New problem(s) identified: No, Describe:  none  New Short Term/Long Term Goal(s): medication stabilization, elimination of SI thoughts, development of comprehensive mental wellness plan  Patient Goals:  Patient did not attend  Discharge Plan or Barriers:   Reason for Continuation of Hospitalization: Depression Medication stabilization Withdrawal symptoms  Estimated Length of Stay: 3-5 days  Attendees: Patient: Pt did not attend 09/23/2019   Physician:  09/23/2019   Nursing:  09/23/2019   RN Care Manager: 09/23/2019   Social Worker:  , LCSW 09/23/2019   Recreational Therapist:  09/23/2019   Other:  09/23/2019   Other:  09/23/2019   Other: 09/23/2019       Scribe for Treatment Team:  A , LCSW 09/23/2019 10:24 AM 

## 2019-09-23 NOTE — Progress Notes (Signed)
Pt denies SI/HI/AVH.  Pt was lethargic this morning and she has been isolative and spent the day in bed.  Pt has been encouraged to attend groups, but pt has declined to thus far.  Pt continues on ativan protocol.  CIWA at 1200 was a 2.  Pt has taken medications without incident and no adverse reactions were noted.  RN will continue to monitor and provide assistance as indicated.

## 2019-09-23 NOTE — Progress Notes (Signed)
[facility] MD Progress Note  09/23/2019 2:16 PM Siearra Amberg  MRN:  [id number]  Subjective: Lucia reports, "I'm feeling a lot better today. I'm not feeling suicidal or homicidal any more".  Objective: Female 54 years old patient who look older than stated age was seen this morning in her room sleeping.  She was easily awaken and engaged in meaningful conversation.  She states that she came to the hospital to get help with Depression and substance use.  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  She states she is tired of using and want to go to a rehabilitation center.  She lives with a boy friend and his son who both are using same drugs.  She does not believe that going back to the house will be a good idea because she will relapse. Kayden is seen, chart reviewed. The chart findings discussed with the treatment team. She presents alert, oriented x 4. She is lying down in bed because she is tired she says. She says she came to the hospital because she was feeling like hurting someone then herself. She says she was also drinking a little bit more alcohol & smoking crack. She adds that she is feeling a lot better today, her mood is good. She says she is doing well on her medications, only that they are making a little more sleepy. She says she is attending group sessions. She denies any SIHI, AVH, delusional thoughts or paranoia. She does not appear to be responding to any internal stimuli. She says she is interested in going to a rehabilitation treatment program after discharge.   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 Cellulitis   . Hep C w/ coma, chronic (HCC)     Past Surgical History:  Procedure Laterality Date  . arm sx    . CHOLECYSTECTOMY     Family History: History reviewed. No pertinent family history.  Family Psychiatric  History: Strong family hx of Schizophrenia-Father, brother and sister  Social History:  Social History   Substance and Sexual Activity  Alcohol Use Yes  . Alcohol/week: 42.0 standard drinks  . Types: 42 Cans of beer per week   Comment: social     Social History   Substance and Sexual Activity  Drug Use Yes  . Frequency: 4.0 times per week  . Types: Cocaine, Methamphetamines   Comment: Crack -smokes    Social History   Socioeconomic History  . Marital status: Legally Separated    Spouse name: Not on file  . Number of children: Not on file  . Years of education: Not on file  . Highest education level: Not on file  Occupational History  . Not on file  Tobacco Use  . Smoking status: Current Every Day Smoker    Packs/day: 0.50    Types: Cigarettes  .  Smokeless tobacco: Never Used  Vaping Use  . Vaping Use: Never used  Substance and Sexual Activity  . Alcohol use: Yes    Alcohol/week: 42.0 standard drinks    Types: 42 Cans of beer per week    Comment: social  . Drug use: Yes    Frequency: 4.0 times per week    Types: Cocaine, Methamphetamines    Comment: Crack -smokes  . Sexual activity: Yes  Other Topics Concern  . Not on file  Social History Narrative  . Not on file   Social Determinants of Health   Financial Resource Strain:   . Difficulty of Paying Living Expenses:  Not on file  Food Insecurity:   . Worried About Programme researcher, broadcasting/film/video in the Last Year: Not on file  . Ran Out of Food in the Last Year: Not on file  Transportation Needs:   . Lack of Transportation (Medical): Not on file  . Lack of Transportation (Non-Medical): Not on file  Physical Activity:   . Days of Exercise per Week: Not on file  . Minutes of Exercise per Session: Not on file  Stress:   . Feeling of Stress : Not on file  Social Connections:   . Frequency of Communication with Friends and Family: Not on file  . Frequency of Social Gatherings with Friends and Family: Not on file  . Attends Religious Services: Not on file  . Active Member of Clubs or Organizations: Not on file  . Attends Banker Meetings: Not on file  . Marital Status: Not on file   Additional Social History:   Sleep: Good  Appetite:  Good  Current Medications: Current Facility-Administered Medications  Medication Dose Route Frequency Provider Last Rate Last Admin  . acetaminophen (TYLENOL) tablet 650 mg  650 mg Oral Q6H PRN Maryagnes Amos, FNP   650 mg at 09/23/19 5009  . alum & mag hydroxide-simeth (MAALOX/MYLANTA) 200-200-20 MG/5ML suspension 30 mL  30 mL Oral Q4H PRN Starkes-Perry, Juel Burrow, FNP      . amantadine (SYMMETREL) capsule 100 mg  100 mg Oral BID Dahlia Byes C, NP   100 mg at 09/23/19 0820  . DULoxetine (CYMBALTA) DR capsule 30 mg  30 mg Oral Daily Onuoha, Josephine C, NP   30 mg at 09/23/19 0820  . hydrOXYzine (ATARAX/VISTARIL) tablet 25 mg  25 mg Oral Q6H PRN Gillermo Murdoch, NP   25 mg at 09/22/19 2111  . loperamide (IMODIUM) capsule 2-4 mg  2-4 mg Oral PRN Gillermo Murdoch, NP      . LORazepam (ATIVAN) tablet 1 mg  1 mg Oral Q6H PRN Gillermo Murdoch, NP   1 mg at 09/21/19 3818  . LORazepam (ATIVAN) tablet 1 mg  1 mg Oral BID Gillermo Murdoch, NP       Followed by  . [START ON 09/25/2019] LORazepam (ATIVAN) tablet 1 mg  1 mg Oral Daily Gillermo Murdoch, NP      . magnesium hydroxide (MILK OF MAGNESIA) suspension 30 mL  30 mL Oral Daily PRN Starkes-Perry, Juel Burrow, FNP      . multivitamin with minerals tablet 1 tablet  1 tablet Oral Daily Gillermo Murdoch, NP   1 tablet at 09/23/19 0820  . nicotine (NICODERM CQ - dosed in mg/24 hours) patch 21 mg  21 mg Transdermal Daily Onuoha, Josephine C, NP   21 mg at 09/23/19 0823  . ondansetron (ZOFRAN-ODT) disintegrating tablet 4 mg  4 mg Oral Q6H PRN Gillermo Murdoch,  NP      . thiamine tablet 100 mg  100 mg Oral Daily Gillermo Murdoch, NP   100 mg at 09/23/19 0820  . traZODone (DESYREL) tablet 100 mg  100 mg Oral QHS PRN Maryagnes Amos, FNP   100 mg at 09/22/19 2111   Lab Results:  Results for orders placed or performed during the hospital encounter of 09/20/19 (from the past 48 hour(s))  Hemoglobin A1c     Status: None   Collection Time: 09/21/19  5:47 PM  Result Value Ref Range   Hgb A1c MFr Bld 5.6 4.8 - 5.6 %    Comment: (NOTE) Pre diabetes:          5.7%-6.4%  Diabetes:              >6.4%  Glycemic control for   <7.0% adults with diabetes    Mean Plasma Glucose 114.02 mg/dL    Comment: Performed at [facility], [address], [place], [place] [address]  Lipid panel     Status: Abnormal   Collection Time: 09/21/19  5:47 PM  Result Value Ref Range   Cholesterol 206 (H) 0 - 200 mg/dL   Triglycerides 604 (H) <150 mg/dL   HDL 42 >54 mg/dL   Total CHOL/HDL Ratio 4.9 RATIO   VLDL 64 (H) 0 - 40 mg/dL   LDL Cholesterol 098 (H) 0 - 99 mg/dL    Comment:        Total Cholesterol/HDL:CHD Risk Coronary Heart Disease Risk Table                     Men   Women  1/2 Average Risk   3.4   3.3  Average Risk       5.0   4.4  2 X Average Risk   9.6   7.1  3 X Average Risk  23.4   11.0        Use the calculated Patient Ratio above and the CHD Risk Table to determine the patient's CHD Risk.        ATP III CLASSIFICATION (LDL):  <100     mg/dL   Optimal   119-147  mg/dL   Near or Above                    Optimal  130-159  mg/dL   Borderline  829-562  mg/dL   High  >130     mg/dL   Very High Performed at [facility], [address] [place], [place] [address]   TSH     Status: Abnormal   Collection Time: 09/21/19  5:47 PM  Result Value Ref Range   TSH 5.131 (H) 0.350 - 4.500 uIU/mL    Comment: Performed by a 3rd Generation assay with a functional sensitivity of <=0.01 uIU/mL. Performed at [facility], [address], [place], [place] [address]    Blood Alcohol level:  Lab Results  Component Value Date   ETH 96 (H) 09/19/2019   ETH 92 (H) 09/20/2018   Metabolic Disorder Labs: Lab Results  Component Value Date   HGBA1C 5.6 09/21/2019   MPG 114.02 09/21/2019   No results found for: PROLACTIN Lab Results  Component Value Date   CHOL 206 (H) 09/21/2019   TRIG 321 (H) 09/21/2019   HDL 42 09/21/2019   CHOLHDL 4.9 09/21/2019   VLDL 64 (H) 09/21/2019   LDLCALC 100 (H) 09/21/2019   Physical  Findings: AIMS: Facial and Oral Movements Muscles of Facial Expression: None, normal Lips and Perioral Area: None, normal Jaw: None, normal Tongue: None, normal,Extremity Movements Upper (arms, wrists, hands, fingers): None, normal Lower (legs, knees, ankles, toes): None, normal, Trunk Movements Neck, shoulders, hips: None, normal, Overall Severity Severity of abnormal movements (highest score from questions above): None, normal Incapacitation due to abnormal movements: None, normal Patient's awareness of abnormal movements (rate only patient's report): No Awareness, Dental Status Current problems with teeth and/or dentures?: No Does patient usually wear dentures?: No  CIWA:  CIWA-Ar Total: 2 COWS:  COWS Total Score: 3  Musculoskeletal: Strength & Muscle Tone: within normal limits Gait & Station: normal Patient leans: N/A  Psychiatric Specialty Exam: Physical Exam Vitals and nursing note  reviewed.  Constitutional:      Appearance: Normal appearance.  HENT:     Head: Normocephalic and atraumatic.  Eyes:     Pupils: Pupils are equal, round, and reactive to light.  Cardiovascular:     Rate and Rhythm: Normal rate.  Pulmonary:     Effort: Pulmonary effort is normal.  Genitourinary:    Comments: Deferred Musculoskeletal:        General: Normal range of motion.     Cervical back: Normal range of motion.  Skin:    General: Skin is warm.  Neurological:     General: No focal deficit present.     Mental Status: She is alert and oriented to person, place, and time.     Review of Systems  Constitutional: Negative.  Negative for chills, diaphoresis and fever.  HENT: Negative.  Negative for congestion, rhinorrhea, sneezing and sore throat.   Eyes: Negative.  Negative for discharge.  Respiratory: Negative.  Negative for cough, chest tightness, shortness of breath and wheezing.   Cardiovascular: Negative.   Gastrointestinal: Negative.  Negative for diarrhea, nausea and vomiting.  Endocrine: Negative.  Negative for cold intolerance.  Genitourinary: Negative.  Negative for difficulty urinating.  Musculoskeletal: Negative.   Allergic/Immunologic: Negative.  Negative for environmental allergies and food allergies.  Neurological: Negative.   Hematological: Negative.   Psychiatric/Behavioral: Positive for dysphoric mood ("Improving"). Negative for agitation, behavioral problems, confusion, decreased concentration, hallucinations, self-injury, sleep disturbance and suicidal ideas. The patient is not nervous/anxious and is not hyperactive.     Blood pressure 115/74, pulse 79, temperature 97.6 F (36.4 C), temperature source Oral, resp. rate 20, height  (1.6 m), weight 80.7 kg, SpO2 98 %.Body mass index is 31.53 kg/m.  General Appearance: Casual  Eye Contact:  Good  Speech:  Clear and Coherent  Volume:  Normal  Mood:  "Improving"  Affect:  Congruent  Thought Process:   Coherent and Descriptions of Associations: Intact  Orientation:  Full (Time, Place, and Person)  Thought Content:  Logical  Suicidal Thoughts:  Denies  Homicidal Thoughts:  Denies  Memory:  Immediate;   Good Recent;   Good Remote;   Good  Judgement:  Good  Insight:  Good  Psychomotor Activity:  Normal  Concentration:  Concentration: Good and Attention Span: Good  Recall:  Good  Fund of Knowledge:  Good  Language:  Good  Akathisia:  NA  Handed:  Right  AIMS (if indicated):     Assets:  Communication Skills Desire for Improvement Housing Resilience  ADL's:  Impaired  Cognition:  WNL  Sleep:  Number of Hours: 6.5   Treatment Plan Summary: Daily contact with patient to assess and evaluate symptoms and progress in treatment and Medication management.  Continue inpatient hospitalization. Will continue today 09/23/2019 plan as below except where it is noted.  Depression. Continue Cymbalta 30 mg po daily.  Alcohol withdrawal management. Continue the CIWA detox protocols as already in progress. Continue Vistaril 25 mg po Q 6 hrs prn for anxiety  Cocaine cravings. Continue Amantadine 100 mg po bid.  Insomnia. Continue Trazodone 100 mg po Q bedtime prn.  Continue nicotine patch 21 mg topically for nicotine withdrawal symptoms. Continue thiamine 100 mg po for thiamine replacement. Continue Imodium 2-4 mg po prn for diarrhea,  Encourage group participation. Discharge disposition plan in progress.  Armandina Stammer, NP, PMHNP, FNP-BC. 09/23/2019, 2:16 PMPatient ID: Wynn Maudlin, female   DOB: 07/21/1965, 54 y.o.   MRN: [id number]

## 2019-09-23 NOTE — [facility] Suicide Risk Assessment (Signed)
[facility] INPATIENT:  Family/Significant Other Suicide Prevention Education  Suicide Prevention Education:  Education Completed; Alexandra Floyd, boyfriend [phone] has been identified by the patient as the family member/significant other with whom the patient will be residing, and identified as the person(s) who will aid the patient in the event of a mental health crisis (suicidal ideations/suicide attempt).  With written consent from the patient, the family member/significant other has been provided the following suicide prevention education, prior to the and/or following the discharge of the patient.  The suicide prevention education provided includes the following:  Suicide risk factors  Suicide prevention and interventions  National Suicide Hotline telephone number  [facility] assessment telephone number  [facility] Emergency Assistance 911  [facility] and/or Residential Mobile Crisis Unit telephone number  Request made of family/significant other to:  Remove weapons (e.g., guns, rifles, knives), all items previously/currently identified as safety concern.    Remove drugs/medications (over-the-counter, prescriptions, illicit drugs), all items previously/currently identified as a safety concern.  The family member/significant other verbalizes understanding of the suicide prevention education information provided.  The family member/significant other agrees to remove the items of safety concern listed above. Mr. Alexandra Floyd reports the pt came to the hospital because "she was stressed out." When asked about her stressors, he states "we all drink a little bit." He reports pt resides in the home with him and his two sons. He states arguments arise between all of them when they consume alcohol. He reports he is a disabled Cytogeneticist. He denies the pt having access to guns or weapons in the home and reports being in support of the pt decision to go to residential treatment at  discharge.  Alexandra Floyd 09/23/2019, 10:49 AM

## 2019-09-23 NOTE — Progress Notes (Signed)
   09/23/19 2020  Psych Admission Type (Psych Patients Only)  Admission Status Voluntary  Psychosocial Assessment  Patient Complaints Depression;Anxiety  Eye Contact Brief  Facial Expression Anxious  Affect Anxious;Depressed  Speech Logical/coherent  Interaction Forwards little;Assertive  Motor Activity Slow  Appearance/Hygiene Unremarkable;In scrubs  Behavior Characteristics Cooperative;Anxious  Mood Depressed;Anxious  Thought Process  Coherency WDL  Content WDL  Delusions None reported or observed  Perception WDL  Hallucination None reported or observed  Judgment Impaired  Confusion None  Danger to Self  Current suicidal ideation? Denies  Danger to Others  Danger to Others None reported or observed   Pt endorses pain 4-5/10 in R knee. Knee assessed and there is some swelling but no warmth or redness. Pt moves extremity without difficulty. Pt states she had dreams today about smoking crack and cigarettes, the two things she wants to give up. Pt denies depression and anxiety now although she said she had some earlier.

## 2019-09-23 NOTE — Progress Notes (Signed)
Recreation Therapy Notes  Date: 8.23.21 Time: 0930 Location: 300 Hall Group Room  Group Topic: Stress Management  Goal Area(s) Addresses:  Patient will identify positive stress management techniques. Patient will identify benefits of using stress management post d/c.  Intervention: Stress Management  Activity:  Meditation.  LRT played a meditation that focused on being resilient in the face of adversity.  Patients were to listen and follow along as meditation played.    Education:  Stress Management, Discharge Planning.   Education Outcome: Acknowledges Education  Clinical Observations/Feedback: Pt did not attend group activity.    Dillon Livermore, LRT/CTRS         Kaylei Frink A 09/23/2019 11:48 AM 

## 2019-09-23 NOTE — [facility] Counselor (Signed)
CSW contacted [facility] for [facility] auth. Pt received [facility] authorization from 09/23/19 -- 09/27/19.  Berkley Harvey number is [phone]

## 2019-09-23 NOTE — [facility] Counselor (Signed)
CSW provided pt with [facility] contact information and encouraged her to call to complete phone interview. Pt agreeable to do so.

## 2019-09-24 DIAGNOSIS — F332 Major depressive disorder, recurrent severe without psychotic features: Principal | ICD-10-CM

## 2019-09-24 LAB — SARS CORONAVIRUS 2 BY RT PCR (HOSPITAL ORDER, PERFORMED IN ~~LOC~~ HOSPITAL LAB): SARS Coronavirus 2: NEGATIVE

## 2019-09-24 NOTE — [facility] Counselor (Addendum)
CSW received call from [facility]. Pt can be admitted for 14 day residential at 1130am on 09/25/19. She will need a new covid test; MD notified. Pt informed and is agreeable.

## 2019-09-24 NOTE — Progress Notes (Signed)
Pt appears in a pleasant mood tonight and was up in the dayroom more often. Pt said she is looking forward to be discharged tomorrow to a rehab center [facility]) and is looking forward to this opportunity because she doesn't want to use meth or cocaine again. She doesn't voice any other concerns or complaints tonight. Pt denies SI/HI and AVH. Active listening, reassurance, and support provided. Q 15 min safety checks continue. Pt's safety has been maintained.     09/24/19 2131  Psych Admission Type (Psych Patients Only)  Admission Status Voluntary  Psychosocial Assessment  Patient Complaints None  Eye Contact Fair  Facial Expression Anxious  Affect Appropriate to circumstance;Anxious  Speech Logical/coherent  Interaction Assertive  Motor Activity Slow  Appearance/Hygiene Unremarkable  Behavior Characteristics Cooperative;Appropriate to situation;Calm  Mood Pleasant  Thought Process  Coherency WDL  Content WDL  Delusions None reported or observed  Perception WDL  Hallucination None reported or observed  Judgment Impaired  Confusion None  Danger to Self  Current suicidal ideation? Denies  Danger to Others  Danger to Others None reported or observed

## 2019-09-24 NOTE — [facility] Counselor (Signed)
CSW met with pt to discuss discharge plan. Pt states she plans to call [facility] today but says she is unsure if she wants to participate in long term residential treatment. CSW encouraged her to speak with [facility] staff about the program before making a decision. Pt is agreeable to do so.

## 2019-09-24 NOTE — Progress Notes (Signed)
RN collected Covid specimen and called [facility] for STAT pick up.  Specimen in lobby in brown bag.

## 2019-09-24 NOTE — Progress Notes (Signed)
Pt denies SI/HI/AVH.  Says that she has some generalized physical pain.  Tylenol given. Denies feeling depressed or anxious.  Continues on ativan taper.  No withdrawal signs or symptoms observed.    Pt is needing COVID test for inpatient placement at another facility (DC for tomorrow morning in place).  Pt declined Covid test at 1410 and asked RN to come back in an hour and a half to complete pt's covid test.    Q 15 min safety checks remain in place.  Pt is isolative to her room for the majority of the day.

## 2019-09-24 NOTE — Progress Notes (Signed)
Patient rated her day as a 5 out of 10. No additional details were provided. Her goal for tomorrow is to go to Rehab which she says has already been arranged for her.

## 2019-09-24 NOTE — Progress Notes (Signed)
   09/24/19 2131  COVID-19 Daily Checkoff  Have you had a fever (temp > 37.80C/100F)  in the past 24 hours?  No  COVID-19 EXPOSURE  Have you traveled outside the state in the past 14 days? No  Have you been in contact with someone with a confirmed diagnosis of COVID-19 or PUI in the past 14 days without wearing appropriate PPE? No  Have you been living in the same home as a person with confirmed diagnosis of COVID-19 or a PUI (household contact)? No  Have you been diagnosed with COVID-19? No

## 2019-09-24 NOTE — Progress Notes (Signed)
Recreation Therapy Notes  Animal-Assisted Activity (AAA) Program Checklist/Progress Notes Patient Eligibility Criteria Checklist & Daily Group note for Rec TxIntervention  Date: 8.24.21 Time: 1430 Location: 300 Hall Group Room   AAA/T Program Assumption of Risk Form signed by Patient/ or Parent Legal Guardian  YES  Patient is free of allergies or sever asthma  YES   Patient reports no fear of animals YES   Patient reports no history of cruelty to animals  YES  Patient understands his/her participation is voluntary YES  Patient washes hands before animal contact  YES   Patient washes hands after animal contact  YES   Behavioral Response:  Engaged  Education:Hand Washing, Appropriate Animal Interaction   Education Outcome: Acknowledges understanding/In group clarification offered/Needs additional education.   Clinical Observations/Feedback:  Pt attended and participated in activity.   Caroll Rancher, LRT/CTRS        Caroll Rancher A 09/24/2019 3:39 PM

## 2019-09-24 NOTE — Progress Notes (Signed)
[facility] MD Progress Note  09/24/2019 5:22 PM Alexandra Floyd  MRN:  [id number] Subjective:  "I'm fine."  Ms. Payment found lying in bed this morning. Admission UDS was positive for meth, cocaine with BAL 96. She reports feeling fatigued but denies other withdrawal symptoms today. She has been isolating to her room. She denies depressed mood, states "I'm just tired." She reports several awakenings overnight but did not take trazodone last night. She also has been sleeping during the day. She denies any SI/HI/AVH. She continues to request inpatient rehab at discharge, and referrals have been made. She has been living with her boyfriend who uses drugs, and is fearful that she will relapse if she returns there.  From admission H&P: Female 54 years old patient who look older than stated age was seen this morning in her room sleeping.  She was easily awaken and engaged in meaningful conversation.  She states that she came to the hospital to get help with Depression and substance use.  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  She states she is tired of using and want to go to a rehabilitation center.  She lives with a boy friend and his son who both are using same drugs.  She does not believe that going back to the house will be a good idea because she will relapse.    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 Cellulitis   . Hep C w/ coma, chronic (HCC)     Past Surgical History:  Procedure Laterality Date  . arm sx    . CHOLECYSTECTOMY     Family History: History reviewed. No pertinent family history. Family Psychiatric  History: See admission H&P Social History:  Social History   Substance and Sexual Activity  Alcohol Use Yes  . Alcohol/week: 42.0 standard drinks  . Types: 42 Cans of beer per week   Comment: social     Social History   Substance and Sexual Activity  Drug Use Yes  . Frequency: 4.0 times per week  . Types: Cocaine, Methamphetamines   Comment: Crack -smokes    Social History   Socioeconomic History  . Marital status: Legally Separated    Spouse name: Not on file  . Number of children: Not on file  . Years of education: Not on file  . Highest education level: Not on file  Occupational History  . Not on file  Tobacco Use  . Smoking status: Current Every Day Smoker    Packs/day: 0.50    Types: Cigarettes  . Smokeless tobacco: Never Used  Vaping Use  . Vaping Use: Never used  Substance and Sexual Activity  . Alcohol use: Yes    Alcohol/week: 42.0 standard drinks    Types: 42 Cans of beer per week    Comment: social  . Drug use: Yes    Frequency: 4.0 times per week  Types: Cocaine, Methamphetamines    Comment: Crack -smokes  . Sexual activity: Yes  Other Topics Concern  . Not on file  Social History Narrative  . Not on file   Social Determinants of Health   Financial Resource Strain:   . Difficulty of Paying Living Expenses: Not on file  Food Insecurity:   . Worried About Programme researcher, broadcasting/film/video in the Last Year: Not on file  . Ran Out of Food in the Last Year: Not on file  Transportation Needs:   . Lack of Transportation (Medical): Not on file  . Lack of Transportation (Non-Medical): Not on file  Physical Activity:   . Days of Exercise per Week: Not on file  . Minutes  of Exercise per Session: Not on file  Stress:   . Feeling of Stress : Not on file  Social Connections:   . Frequency of Communication with Friends and Family: Not on file  . Frequency of Social Gatherings with Friends and Family: Not on file  . Attends Religious Services: Not on file  . Active Member of Clubs or Organizations: Not on file  . Attends Banker Meetings: Not on file  . Marital Status: Not on file   Additional Social History:                         Sleep: Fair  Appetite:  Fair  Current Medications: Current Facility-Administered Medications  Medication Dose Route Frequency Provider Last Rate Last Admin  . acetaminophen (TYLENOL) tablet 650 mg  650 mg Oral Q6H PRN Maryagnes Amos, FNP   650 mg at 09/24/19 1629  . alum & mag hydroxide-simeth (MAALOX/MYLANTA) 200-200-20 MG/5ML suspension 30 mL  30 mL Oral Q4H PRN Starkes-Perry, Juel Burrow, FNP      . amantadine (SYMMETREL) capsule 100 mg  100 mg Oral BID Dahlia Byes C, NP   100 mg at 09/24/19 1628  . DULoxetine (CYMBALTA) DR capsule 30 mg  30 mg Oral Daily Onuoha, Josephine C, NP   30 mg at 09/24/19 0900  . [START ON 09/25/2019] LORazepam (ATIVAN) tablet 1 mg  1 mg Oral Daily Gillermo Murdoch, NP      . magnesium hydroxide (MILK OF MAGNESIA) suspension 30 mL  30 mL Oral Daily PRN Starkes-Perry, Juel Burrow, FNP      . multivitamin with minerals tablet 1 tablet  1 tablet Oral Daily Gillermo Murdoch, NP   1 tablet at 09/24/19 0900  . nicotine (NICODERM CQ - dosed in mg/24 hours) patch 21 mg  21 mg Transdermal Daily Onuoha, Josephine C, NP   21 mg at 09/24/19 0800  . thiamine tablet 100 mg  100 mg Oral Daily Gillermo Murdoch, NP   100 mg at 09/24/19 0900  . traZODone (DESYREL) tablet 100 mg  100 mg Oral QHS PRN Maryagnes Amos, FNP   100 mg at 09/22/19 2111    Lab Results: No results found for this or any previous visit (from the past 48 hour(s)).  Blood Alcohol level:  Lab  Results  Component Value Date   ETH 96 (H) 09/19/2019   ETH 92 (H) 09/20/2018    Metabolic Disorder Labs: Lab Results  Component Value Date   HGBA1C 5.6 09/21/2019   MPG 114.02 09/21/2019   No results found for: PROLACTIN Lab Results  Component Value Date   CHOL 206 (H) 09/21/2019   TRIG 321 (H) 09/21/2019   HDL 42 09/21/2019   CHOLHDL 4.9  09/21/2019   VLDL 64 (H) 09/21/2019   LDLCALC 100 (H) 09/21/2019    Physical Findings: AIMS: Facial and Oral Movements Muscles of Facial Expression: None, normal Lips and Perioral Area: None, normal Jaw: None, normal Tongue: None, normal,Extremity Movements Upper (arms, wrists, hands, fingers): None, normal Lower (legs, knees, ankles, toes): None, normal, Trunk Movements Neck, shoulders, hips: None, normal, Overall Severity Severity of abnormal movements (highest score from questions above): None, normal Incapacitation due to abnormal movements: None, normal Patient's awareness of abnormal movements (rate only patient's report): No Awareness, Dental Status Current problems with teeth and/or dentures?: No Does patient usually wear dentures?: No  CIWA:  CIWA-Ar Total: 1 COWS:  COWS Total Score: 3  Musculoskeletal: Strength & Muscle Tone: within normal limits Gait & Station: normal Patient leans: N/A  Psychiatric Specialty Exam: Physical Exam Vitals and nursing note reviewed.  Constitutional:      Appearance: She is well-developed.  Cardiovascular:     Rate and Rhythm: Normal rate.  Pulmonary:     Effort: Pulmonary effort is normal.  Neurological:     Mental Status: She is alert and oriented to person, place, and time.     Review of Systems  Constitutional: Negative.   Respiratory: Negative for cough and shortness of breath.   Psychiatric/Behavioral: Negative for agitation, behavioral problems, confusion, decreased concentration, dysphoric mood, hallucinations, self-injury, sleep disturbance and suicidal ideas. The patient  is not nervous/anxious and is not hyperactive.     Blood pressure (!) 121/94, pulse 80, temperature 97.6 F (36.4 C), temperature source Oral, resp. rate 20, height 5\' 3"  (1.6 m), weight 80.7 kg, SpO2 98 %.Body mass index is 31.53 kg/m.  General Appearance: Disheveled  Eye Contact:  Fair  Speech:  Slow  Volume:  Normal  Mood:  Euthymic  Affect:  Constricted  Thought Process:  Coherent and Goal Directed  Orientation:  Full (Time, Place, and Person)  Thought Content:  Logical  Suicidal Thoughts:  No  Homicidal Thoughts:  No  Memory:  Immediate;   Fair Recent;   Fair  Judgement:  Fair  Insight:  Fair  Psychomotor Activity:  Decreased  Concentration:  Concentration: Good and Attention Span: Fair  Recall:  of Knowledge:  Fair  Language:  Good  Akathisia:  No  Handed:  Right  AIMS (if indicated):     Assets:  Communication Skills Desire for Improvement Housing Resilience  ADL's:  Intact  Cognition:  WNL  Sleep:  Number of Hours: 6.75     Treatment Plan Summary: Daily contact with patient to assess and evaluate symptoms and progress in treatment and Medication management   Continue inpatient hospitalization.  Discontinue Ativan  Continue Cymbalta 30 mg PO daily for depression/anxiety Continue amantadine 100 mg PO BID for cocaine cravings Continue thiamine 100 mg PO daily for supplementation Continue trazodone 100 mg PO QHS PRN insomnia  Patient will participate in the therapeutic group milieu.  Discharge disposition in progress.   [employer], NP 09/24/2019, 5:22 PM

## 2019-09-25 MED ORDER — DULOXETINE HCL 30 MG PO CPEP: 30.0000 mg | ORAL_CAPSULE | Freq: Every day | ORAL | 0 refills | Status: DC

## 2019-09-25 MED ORDER — AMANTADINE HCL 100 MG PO CAPS: 100.0000 mg | ORAL_CAPSULE | Freq: Two times a day (BID) | ORAL | 0 refills | Status: DC

## 2019-09-25 MED ORDER — TRAZODONE HCL 100 MG PO TABS
100.0000 mg | ORAL_TABLET | Freq: Every evening | ORAL | 0 refills | Status: DC | PRN
Start: 2019-09-25 — End: 2021-06-10

## 2019-09-25 MED ORDER — NICOTINE 21 MG/24HR TD PT24
21.0000 mg | MEDICATED_PATCH | Freq: Every day | TRANSDERMAL | 0 refills | Status: DC
Start: 2019-09-25 — End: 2021-06-10

## 2019-09-25 NOTE — [facility] Suicide Risk Assessment (Signed)
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   Blood pressure (!) 121/94, pulse 80, temperature 97.6 F (36.4 C), temperature source Oral, resp. rate 20, height 5\' 3"  (1.6 m), weight 80.7 kg, SpO2 98 %.Body mass index is 31.53 kg/m.  General Appearance: Casual  Eye Contact::  Fair  Speech:  Normal Rate409  Volume:  Normal  Mood:  Euthymic  Affect:  Congruent  Thought Process:  Coherent and Descriptions of Associations: Intact  Orientation:  Full (Time, Place, and Person)  Thought Content:  Logical  Suicidal Thoughts:  No  Homicidal Thoughts:  No  Memory:  Immediate;   Fair Recent;   Fair Remote;   Fair  Judgement:  Intact  Insight:  Fair  Psychomotor Activity:  Normal  Concentration:  Fair  Recall:  [phone] of Knowledge:Good  Language: Good  Akathisia:  Negative  Handed:  Right  AIMS (if indicated):     Assets:  Desire for Improvement Housing Resilience Social Support  Sleep:  Number of Hours: 6.75  Cognition: WNL  ADL's:  Intact   Mental Status Per Nursing Assessment::   On Admission:     Demographic Factors:  Divorced or widowed and Caucasian  Loss Factors: NA  Historical  Factors: Impulsivity  Risk Reduction Factors:   Sense of responsibility to family, Living with another person, especially a relative and Positive social support  Continued Clinical Symptoms:  Depression:   Comorbid alcohol abuse/dependence Impulsivity Alcohol/Substance Abuse/Dependencies  Cognitive Features That Contribute To Risk:  None    Suicide Risk:  Minimal: No identifiable suicidal ideation.  Patients presenting with no risk factors but with morbid ruminations; may be classified as minimal risk based on the severity of the depressive symptoms   Follow-up Information    [facility] Go on 09/25/2019.   Why: Intake scheduled at 1130am Contact information: [address] [place] [address]               Plan Of Care/Follow-up recommendations:  Activity:  ad lib  [phone], MD 09/25/2019, 7:30 AM

## 2019-09-25 NOTE — Progress Notes (Signed)
Discharge Note:  Patient denies SI/HI AVH at this time. Discharge instructions, AVS, prescriptions and transition record gone over with patient. Patient agrees to comply with medication management, follow-up visit, and outpatient therapy. Patient belongings returned to patient. Patient questions and concerns addressed and answered.  Patient ambulatory off unit.  Patient discharged.  

## 2019-09-25 NOTE — Progress Notes (Signed)
  [facility] Adult Case Management Discharge Plan :  Will you be returning to the same living situation after discharge:  No. Residential treatment.  At discharge, do you have transportation home?: No. [facility] Do you have the ability to pay for your medications: No.   Release of information consent forms completed and in the chart;  Patient's signature needed at discharge.  Patient to Follow up at:  Follow-up Information    [facility]. Go on 09/25/2019.   Why: Intake scheduled at 1130am Contact information: [address] [place] [address] [phone]               Next level of care provider has access to [facility] Link:no  Safety Planning and Suicide Prevention discussed: Yes,  with pt.   Have you used any form of tobacco in the last 30 days? (Cigarettes, Smokeless Tobacco, Cigars, and/or Pipes): Yes  Has patient been referred to the Quitline?: Patient refused referral  Patient has been referred for addiction treatment: Yes  Erin Sons, LCSW 09/25/2019, 10:44 AM

## 2019-09-25 NOTE — Discharge Summary (Signed)
Physician Discharge Summary Note  Patient:  Alexandra Floyd is an 54 y.o., female MRN:  [id number] DOB:  14-Sep-1965 Patient phone:  [phone] (home)  Patient address:   [place] [address],  Total Time spent with patient: Greater than 30 minutes  Date of Admission:  09/20/2019  Date of Discharge: 09-25-19  Reason for Admission: Worsening drug use & in need of substance abuse treatment after discharge.  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  Past Medical History:  Past Medical History:  Diagnosis Date  . Cellulitis   . Hep C w/ coma, chronic (HCC)     Past Surgical History:  Procedure Laterality Date  . arm sx    . CHOLECYSTECTOMY     Family History: History reviewed. No pertinent family history.  Family Psychiatric  History: See H&P  Social History:  Social History   Substance and Sexual Activity  Alcohol Use Yes  . Alcohol/week: 42.0 standard drinks  . Types: 42 Cans of beer per week   Comment: social     Social History   Substance and Sexual Activity  Drug Use Yes  . Frequency: 4.0 times per week  . Types: Cocaine, Methamphetamines   Comment: Crack -smokes    Social History   Socioeconomic History  . Marital status: Legally Separated    Spouse name: Not on file  . Number of children: Not on file  . Years of education: Not on file  . Highest  education level: Not on file  Occupational History  . Not on file  Tobacco Use  . Smoking status: Current Every Day Smoker    Packs/day: 0.50    Types: Cigarettes  . Smokeless tobacco: Never Used  Vaping Use  . Vaping Use: Never used  Substance and Sexual Activity  . Alcohol use: Yes    Alcohol/week: 42.0 standard drinks    Types: 42 Cans of beer per week    Comment: social  . Drug use: Yes    Frequency: 4.0 times per week    Types: Cocaine, Methamphetamines    Comment: Crack -smokes  . Sexual activity: Yes  Other Topics Concern  . Not on file  Social History Narrative  . Not on file   Social Determinants of Health   Financial Resource Strain:   . Difficulty of Paying Living Expenses: Not on file  Food Insecurity:   . Worried About Charity fundraiser in the Last Year: Not on file  . Ran Out of Food in the Last Year: Not on file  Transportation Needs:   . Lack of Transportation (Medical): Not on file  . Lack of Transportation (Non-Medical): Not on file  Physical Activity:   . Days of Exercise per Week: Not on file  . Minutes of Exercise per Session: Not on file  Stress:   . Feeling of Stress : Not on file  Social Connections:   . Frequency of Communication with Friends and Family: Not on file  . Frequency  of Social Gatherings with Friends and Family: Not on file  . Attends Religious Services: Not on file  . Active Member of Clubs or Organizations: Not on file  . Attends Archivist Meetings: Not on file  . Marital Status: Not on file   Hospital Course: (Per Provider's admission evaluation notes): Female 54 years old patient who look older than stated age was seen this morning in her room sleeping.  She was easily awaken and engaged in meaningful conversation.  She states that she came to the hospital to get help with Depression and substance use.  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 She states she is tired of  using and want to go to a rehabilitation center.  She lives with a boy friend and his son who both are using same drugs.  She does not believe that going back to the house will be a good idea because she will relapse.  She came to the ER to avoid hurting herself or anybody else.  He he had taken 6 beers and crack Cocaine the evening she came to the ER. She called the Police to bring her to the hospital.  Today she reports that she has been in a rehabilitation facility in [facility] 4 years ago and that she has been in rehab couple other times.  She reports she has been on Psychotropic medications in the past and unable to name even one medication she had tried in the past.  She admits that she has never been compliant with her medications but uses Cocaine and Methamphetamine to treat self.  She drinks daily Beer 6 packs a day and she also drinks a medium size bottle wine daily.  BAL on arrival at the ER WAS 96.  She denies suicide ideation and states that last night was the best night sleep in months. We discussed detox treatment using CIWA protocol USING  Ativan,.  Patient was encouraged to inform staff of any withdrawal symptoms.  Provider reassured patient that Rehabilitation treatment request will be addressed with SW.  Once she is detoxed we will discuss best antidepressant that she can afford and tolerated by patient.  After the above admission evaluation, patient's presenting symptoms were noted. Alexandra Floyd was recommended for alcohol detoxification &mood stabilization treatments. The medication regimen targeting those presenting symptoms were discussed with her & initiated with her consent. She was medicated, stabilized & discharged on the medications as listed on her discharge medication lists below. She received the Ativan detox protocols for alcohol withdrawal managaments. Besides the mood stabilization/detoxification treatments, Alexandra Floyd was also enrolled & participated in the group counseling  sessions being offered & held on this unit. She learned coping skills. She also presented other significant pre-existing medical issues that required treatment. She was resumed & discharged on all her pertinent home medications for those health issues. She tolerated her treatment regimen without any adverse effects or reactions reported. Part her discharge plan was a recommendation/referral to the [facility] for further treatments after discharge.  Alexandra Floyd symptoms responded well to her treatment regimen. Her symptoms has subsided & mood stable. Patient has met the maximum benefit of her hospitalization. She is currently mentally & medically stable to continue substance abuse treatment, mental health care & medication management at [facility] as noted below. She is provided with all the necessary information needed to make this appointment without problems. Upon discharge, Alexandra Floyd adamantly denies any suicidal/homicidal ideations, auditory/visual hallucinations delusional thinking, paranoia  or substance withdrawal symptoms.   During the course of her hospitalization, the 15-minute checks were adequate to ensure Alexandra Floyd's safety.  Patient did not display any dangerous, violent or suicidal behavior on the unit.  She interacted with patients & staff appropriately, participated appropriately in the group sessions/therapies. Her medications were addressed & adjusted to meet her needs. She was recommended for outpatient follow-up care & medication management upon discharge to assure continuity of care.  At the time of discharge patient is not reporting any acute suicidal/homicidal ideations or substance withdrawal symptoms. She feels more confident about her self-care & in managing her mental health issues moving forward. She currently denies any new issues or concerns. Education and supportive counseling provided throughout her hospital stay & upon discharge.  Today upon her discharge evaluation with the attending  psychiatrist, Alexandra Floyd shares she is doing well. She denies any other specific concerns. She is sleeping well. Her appetite is good. She denies other physical complaints. She denies AH/VH. She feels that her medications have been helpful & is in agreement to continue her current treatment regimen. She was able to engage in safety planning including plan to return to [facility] or contact emergency services if she feels unable to maintain her own safety or the safety of others. Pt had no further questions, comments, or concerns. She left [facility] with all personal belongings in no apparent distress. Transportation per the [facility] transport to the [facility].  Physical Findings: AIMS: Facial and Oral Movements Muscles of Facial Expression: None, normal Lips and Perioral Area: None, normal Jaw: None, normal Tongue: None, normal,Extremity Movements Upper (arms, wrists, hands, fingers): None, normal Lower (legs, knees, ankles, toes): None, normal, Trunk Movements Neck, shoulders, hips: None, normal, Overall Severity Severity of abnormal movements (highest score from questions above): None, normal Incapacitation due to abnormal movements: None, normal Patient's awareness of abnormal movements (rate only patient's report): No Awareness, Dental Status Current problems with teeth and/or dentures?: No Does patient usually wear dentures?: No  CIWA:  CIWA-Ar Total: 1 COWS:  COWS Total Score: 3  Musculoskeletal: Strength & Muscle Tone: within normal limits Gait & Station: normal Patient leans: N/A  Psychiatric Specialty Exam: Physical Exam Vitals and nursing note reviewed.  HENT:     Head: Normocephalic.     Nose: Nose normal.     Mouth/Throat:     Pharynx: Oropharynx is clear.  Eyes:     Pupils: Pupils are equal, round, and reactive to light.  Cardiovascular:     Rate and Rhythm: Normal rate.     Pulses: Normal pulses.  Pulmonary:     Effort: Pulmonary effort is normal.  Genitourinary:    Comments:  Deferred Musculoskeletal:        General: Normal range of motion.     Cervical back: Normal range of motion.  Skin:    General: Skin is warm and dry.  Neurological:     Mental Status: She is alert and oriented to person, place, and time.     Review of Systems  Constitutional: Negative for activity change, chills, diaphoresis and fever.  HENT: Negative for congestion, rhinorrhea, sneezing and sore throat.   Eyes: Negative for discharge.  Respiratory: Negative for cough, choking, chest tightness, shortness of breath and wheezing.   Cardiovascular: Negative for chest pain and palpitations.  Gastrointestinal: Negative for diarrhea, nausea and vomiting.  Endocrine: Negative for cold intolerance.  Genitourinary: Negative for difficulty urinating.  Musculoskeletal: Negative for arthralgias and myalgias.  Allergic/Immunologic: Negative for environmental allergies  and food allergies.       Allergies: Lasix  Neurological: Negative for dizziness, tremors, seizures, syncope, facial asymmetry, speech difficulty, weakness, light-headedness, numbness and headaches.  Hematological: Negative for adenopathy. Does not bruise/bleed easily.  Psychiatric/Behavioral: Positive for dysphoric mood (Stabilized with medication prior to discharge) and sleep disturbance (Stabilized with medication prior to discharge). Negative for agitation, behavioral problems, confusion, decreased concentration, hallucinations, self-injury and suicidal ideas. The patient is not nervous/anxious (Stable upon discharge) and is not hyperactive.     Blood pressure (!) 121/94, pulse 80, temperature 97.6 F (36.4 C), temperature source Oral, resp. rate 20, height '5\' 3"'  (1.6 m), weight 80.7 kg, SpO2 98 %.Body mass index is 31.53 kg/m.  See Md's discharge SRA  Sleep:  Number of Hours: 6.75   Have you used any form of tobacco in the last 30 days? (Cigarettes, Smokeless Tobacco, Cigars, and/or Pipes): Yes  Has this patient used any form  of tobacco in the last 30 days? (Cigarettes, Smokeless Tobacco, Cigars, and/or Pipes): Yes, an FDA-approved tobacco cessation medication was offered at discharge.  Blood Alcohol level:  Lab Results  Component Value Date   ETH 96 (H) 09/19/2019   ETH 92 (H) 32/67/1245   Metabolic Disorder Labs:  Lab Results  Component Value Date   HGBA1C 5.6 09/21/2019   MPG 114.02 09/21/2019   No results found for: PROLACTIN Lab Results  Component Value Date   CHOL 206 (H) 09/21/2019   TRIG 321 (H) 09/21/2019   HDL 42 09/21/2019   CHOLHDL 4.9 09/21/2019   VLDL 64 (H) 09/21/2019   LDLCALC 100 (H) 09/21/2019   See Psychiatric Specialty Exam and Suicide Risk Assessment completed by Attending Physician prior to discharge.  Discharge destination:  [facility]  Is patient on multiple antipsychotic therapies at discharge:  No   Has Patient had three or more failed trials of antipsychotic monotherapy by history:  No  Recommended Plan for Multiple Antipsychotic Therapies: NA  Allergies as of 09/25/2019      Reactions   Lasix [furosemide] Rash      Medication List    TAKE these medications     Indication  amantadine 100 MG capsule Commonly known as: SYMMETREL Take 1 capsule (100 mg total) by mouth 2 (two) times daily. For cocaine cravings  Indication: Cocaine cravings   DULoxetine 30 MG capsule Commonly known as: CYMBALTA Take 1 capsule (30 mg total) by mouth daily. For depression  Indication: Major Depressive Disorder   nicotine 21 mg/24hr patch Commonly known as: NICODERM CQ - dosed in mg/24 hours Place 1 patch (21 mg total) onto the skin daily. (May buy from over the counter): For smoking cessation  Indication: Nicotine Addiction   traZODone 100 MG tablet Commonly known as: DESYREL Take 1 tablet (100 mg total) by mouth at bedtime as needed for sleep.  Indication: [facility]. Go on 09/25/2019.    Why: Intake scheduled at 1130am Contact information: [place]  [id number] [phone]              Follow-up recommendations:  Activity:  As tolerated Diet: As recommended by your primary care doctor. Keep all scheduled follow-up appointments as recommended.  Comments: Prescriptions given at discharge.  Patient agreeable to plan.  Given opportunity to ask questions.  Appears to feel comfortable with discharge denies any current suicidal or homicidal thought. Patient is also instructed prior to  discharge to: Take all medications as prescribed by his/her mental healthcare provider. Report any adverse effects and or reactions from the medicines to his/her outpatient provider promptly. Patient has been instructed & cautioned: To not engage in alcohol and or illegal drug use while on prescription medicines. In the event of worsening symptoms, patient is instructed to call the crisis hotline, 911 and or go to the nearest ED for appropriate evaluation and treatment of symptoms. To follow-up with his/her primary care provider for your other medical issues, concerns and or health care needs.  Signed: Lindell Spar, NP, PMHNP, FNP-BC 09/25/2019, 8:48 AM

## 2019-10-12 ENCOUNTER — Other Ambulatory Visit: Payer: Self-pay

## 2019-10-12 ENCOUNTER — Emergency Department (HOSPITAL_COMMUNITY)
Admission: EM | Admit: 2019-10-12 | Discharge: 2019-10-12 | Disposition: A | Discharge status: Home / Self Care | Payer: Self-pay | Attending: Emergency Medicine | Admitting: Emergency Medicine

## 2019-10-12 ENCOUNTER — Encounter (HOSPITAL_COMMUNITY): Payer: Self-pay

## 2019-10-12 DIAGNOSIS — F17210 Nicotine dependence, cigarettes, uncomplicated: Secondary | ICD-10-CM | POA: Insufficient documentation

## 2019-10-12 DIAGNOSIS — L709 Acne, unspecified: Secondary | ICD-10-CM | POA: Insufficient documentation

## 2019-10-12 DIAGNOSIS — R238 Other skin changes: Secondary | ICD-10-CM

## 2019-10-12 DIAGNOSIS — R509 Fever, unspecified: Secondary | ICD-10-CM | POA: Insufficient documentation

## 2019-10-12 DIAGNOSIS — Z79899 Other long term (current) drug therapy: Secondary | ICD-10-CM | POA: Insufficient documentation

## 2019-10-12 NOTE — ED Provider Notes (Signed)
[facility] EMERGENCY DEPARTMENT Provider Note   CSN: [id number] Arrival date & time: 10/12/19  1951     History Chief Complaint  Patient presents with  . Facial Swelling    abcess hx MRSA    Alexandra Floyd is a 54 y.o. female.  HPI   54 year old female with a history of cellulitis, polysubstance abuse, hep C, who presents to the emergency department today for evaluation of concern for a skin lesion.  States that a few days ago she noticed a swollen area to the left side of her nose.  She started picking at it and since then the swelling has improved.  She felt like she had some chills earlier so she took some ibuprofen.  She said no drainage from the area.  She has a history of cellulitis so she was concerned and came to the ED.  Past Medical History:  Diagnosis Date  . Cellulitis   . Hep C w/ coma, chronic [facility])     Patient Active Problem List   Diagnosis Date Noted  . Substance induced mood disorder (HCC) 09/21/2019  . Polysubstance abuse (HCC) 09/21/2019  . 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 Cocaine use disorder, mild, abuse (HCC) 09/21/2019  . 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 MDD (major depressive disorder), recurrent episode, severe (HCC) 09/20/2019    Past Surgical History:  Procedure Laterality Date  . arm sx    . CHOLECYSTECTOMY       OB History   No obstetric history on file.     No family history on file.  Social History   Tobacco Use  . Smoking status: Current Every Day Smoker    Packs/day: 0.50    Types: Cigarettes  . Smokeless tobacco: Never Used  Vaping Use  . Vaping Use: Never used  Substance Use Topics  . Alcohol use: Yes    Alcohol/week: 42.0 standard drinks    Types: 42 Cans of beer per week    Comment: social  . Drug use: Yes    Frequency: 4.0 times per  week    Types: Cocaine, Methamphetamines    Comment: Crack -smokes    Home Medications Prior to Admission medications   Medication Sig Start Date End Date Taking? Authorizing Provider  amantadine (SYMMETREL) 100 MG capsule Take 1 capsule (100 mg total) by mouth 2 (two) times daily. For cocaine cravings 09/25/19   Armandina Stammer I, NP  DULoxetine (CYMBALTA) 30 MG capsule Take 1 capsule (30 mg total) by mouth daily. For depression 09/25/19   Armandina Stammer I, NP  nicotine (NICODERM CQ - DOSED IN MG/24 HOURS) 21 mg/24hr patch Place 1 patch (21 mg total) onto the skin daily. (May buy from over the counter): For smoking cessation 09/25/19   Armandina Stammer I, NP  traZODone (DESYREL) 100 MG tablet Take 1 tablet (100 mg total) by mouth at bedtime as needed for sleep. 09/25/19   Armandina Stammer I, NP    Allergies    Lasix [furosemide]  Review of Systems   Review of Systems  Constitutional: Positive for fever (subjective).  Skin:       Skin lesion    Physical Exam Updated Vital Signs BP 114/77 (BP Location: Right Arm)   Pulse 85   Temp 97.7 F (36.5 C) (Oral)   Resp 17   Ht 5\' 3"  (1.6 m)  Wt 79.4 kg   SpO2 99%   BMI 31.00 kg/m   Physical Exam Vitals and nursing note reviewed.  Constitutional:      General: She is not in acute distress.    Appearance: She is well-developed.  HENT:     Head: Normocephalic and atraumatic.     Nose:     Comments: <0.5 cm area of erythema to the left side of the nose. No induration, fluctuance or drainage Eyes:     Conjunctiva/sclera: Conjunctivae normal.  Cardiovascular:     Rate and Rhythm: Normal rate.  Pulmonary:     Effort: Pulmonary effort is normal.  Musculoskeletal:        General: Normal range of motion.     Cervical back: Neck supple.  Skin:    General: Skin is warm and dry.  Neurological:     Mental Status: She is alert.       ED Results / Procedures / Treatments   Labs (all labs ordered are listed, but only abnormal results are  displayed) Labs Reviewed - No data to display  EKG None  Radiology No results found.  Procedures Procedures (including critical care time)  Medications Ordered in ED Medications - No data to display  ED Course  I have reviewed the triage vital signs and the nursing notes.  Pertinent labs & imaging results that were available during my care of the patient were reviewed by me and considered in my medical decision making (see chart for details).    MDM Rules/Calculators/A&P                          54 year old female presenting for evaluation of skin lesion.  Exam is consistent with what looks like a pimple.  There are no signs of infection, cellulitis or abscess.  Advised to use antibiotic ointment and warm compresses at home.  Advised on close follow-up and return precautions.  She voices understanding of the plan and reasons to return.  Questions answered.  Patient stable for discharge.   Final Clinical Impression(s) / ED Diagnoses Final diagnoses:  Skin pimple    Rx / DC Orders ED Discharge Orders    None       Rayne Du 10/12/19 2214    Jacalyn Lefevre, MD 10/12/19 2319

## 2019-10-12 NOTE — Discharge Instructions (Signed)
Please follow up with your primary care provider within 5-7 days for re-evaluation of your symptoms. If you do not have a primary care provider, information for a healthcare clinic has been provided for you to make arrangements for follow up care. Please return to the emergency department for any new or worsening symptoms. ° °

## 2019-10-12 NOTE — ED Triage Notes (Signed)
Pt reports noticing pimple on face (cheek) yesterday and picking at it, now she has facial swelling and reports fever. Pt temp in triage is 97.7.

## 2019-10-17 ENCOUNTER — Emergency Department (HOSPITAL_COMMUNITY)
Admission: EM | Admit: 2019-10-17 | Discharge: 2019-10-17 | Disposition: A | Discharge status: Home / Self Care | Payer: Self-pay | Attending: Emergency Medicine | Admitting: Emergency Medicine

## 2019-10-17 ENCOUNTER — Other Ambulatory Visit: Payer: Self-pay

## 2019-10-17 ENCOUNTER — Encounter (HOSPITAL_COMMUNITY): Payer: Self-pay | Admitting: *Deleted

## 2019-10-17 DIAGNOSIS — F17210 Nicotine dependence, cigarettes, uncomplicated: Secondary | ICD-10-CM | POA: Insufficient documentation

## 2019-10-17 DIAGNOSIS — J0110 Acute frontal sinusitis, unspecified: Secondary | ICD-10-CM | POA: Insufficient documentation

## 2019-10-17 HISTORY — DX: Cellulitis, unspecified: L03.90

## 2019-10-17 HISTORY — DX: Methicillin resistant Staphylococcus aureus infection as the cause of diseases classified elsewhere: B95.62

## 2019-10-17 MED ORDER — AZITHROMYCIN 250 MG PO TABS: 250.0000 mg | ORAL_TABLET | Freq: Every day | ORAL | 0 refills | Status: DC

## 2019-10-17 NOTE — Discharge Instructions (Signed)
Take the entire course of the antibiotics prescribed.  You may also try warm compresses at your area of pain which can be soothing.  I also recommend ibuprofen if needed for pain relief.  See the referrals suggested for finding a primary medical care.

## 2019-10-17 NOTE — ED Triage Notes (Signed)
Pt with headache to left side of her nose and left eye.  Pt states she had a pimple few days ago.

## 2019-10-19 NOTE — ED Provider Notes (Signed)
[facility] EMERGENCY DEPARTMENT Provider Note   CSN: [id number] Arrival date & time: 10/17/19  1407     History Chief Complaint  Patient presents with  . Headache    Alexandra Floyd is a 54 y.o. female with a history of Hep C, polysubstance abuse and recent evaluation here for a skin infection/papule on her left nasal bridge returns today for complaint of headache localizing to her left forehead along with pain behind her left eye, nasal congestion with clear rhinorrhea, symptoms she has experienced in the past with sinus infection.  She has had subjective fever. Denies ear pain, neck pain or stiffness, denies vision changes, dizziness, sore throat or any other complaint.  She has had no tx prior to arrival.   HPI     Past Medical History:  Diagnosis Date  . Cellulitis   . Hep C w/ coma, chronic (HCC)   . MRSA cellulitis     Patient Active Problem List   Diagnosis Date Noted  . Substance induced mood disorder (HCC) 09/21/2019  . Polysubstance abuse (HCC) 09/21/2019  . 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 Cocaine use disorder, mild, abuse (HCC) 09/21/2019  . 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 MDD (major depressive disorder), recurrent episode, severe (HCC) 09/20/2019    Past Surgical History:  Procedure Laterality Date  . arm sx    . CHOLECYSTECTOMY       OB History   No obstetric history on file.     No family history on file.  Social History   Tobacco Use  . Smoking status: Current Every Day Smoker    Packs/day: 0.50    Types: Cigarettes  . Smokeless tobacco: Never Used  Vaping Use  . Vaping Use: Never used  Substance Use Topics  . Alcohol use: Yes    Alcohol/week: 42.0 standard drinks    Types: 42 Cans of beer per week    Comment: social  . Drug use: Not Currently    Frequency:  4.0 times per week    Types: Cocaine, Methamphetamines    Comment: denies any use 10/17/19    Home Medications Prior to Admission medications   Medication Sig Start Date End Date Taking? Authorizing Provider  amantadine (SYMMETREL) 100 MG capsule Take 1 capsule (100 mg total) by mouth 2 (two) times daily. For cocaine cravings 09/25/19   Armandina Stammer I, NP  azithromycin (ZITHROMAX) 250 MG tablet Take 1 tablet (250 mg total) by mouth daily. Take first 2 tablets together, then 1 every day until finished. 10/17/19   Burgess Amor, PA-C  DULoxetine (CYMBALTA) 30 MG capsule Take 1 capsule (30 mg total) by mouth daily. For depression 09/25/19   Armandina Stammer I, NP  nicotine (NICODERM CQ - DOSED IN MG/24 HOURS) 21 mg/24hr patch Place 1 patch (21 mg total) onto the skin daily. (May buy from over the counter): For smoking cessation 09/25/19   Armandina Stammer I, NP  traZODone (DESYREL) 100 MG tablet Take 1 tablet (100 mg total) by mouth at bedtime as needed for sleep. 09/25/19   Armandina Stammer I, NP    Allergies    Lasix [furosemide]  Review of Systems   Review of Systems  Constitutional: Positive for fever. Negative for chills.  HENT: Positive for congestion, facial swelling, rhinorrhea, sinus pressure and sore throat. Negative for ear pain, trouble swallowing and voice change.  Eyes: Positive for pain. Negative for discharge, redness and visual disturbance.  Respiratory: Negative for cough, shortness of breath, wheezing and stridor.   Cardiovascular: Negative for chest pain.  Gastrointestinal: Negative for abdominal pain.  Genitourinary: Negative.   Musculoskeletal: Negative for neck pain and neck stiffness.  Neurological: Positive for headaches. Negative for dizziness.    Physical Exam Updated Vital Signs BP 110/86   Pulse 74   Temp 97.9 F (36.6 C) (Oral)   Resp 18   Ht 5\' 3"  (1.6 m)   Wt 77.1 kg   SpO2 98%   BMI 30.11 kg/m   Physical Exam Constitutional:      General: She is not in acute  distress.    Appearance: She is well-developed.  HENT:     Head: Normocephalic and atraumatic. No right periorbital erythema or left periorbital erythema.     Comments: Pt feels left face swollen, no objective edema noted. No erythema.    Right Ear: Tympanic membrane and ear canal normal.     Left Ear: Tympanic membrane and ear canal normal.     Nose: Rhinorrhea present. No mucosal edema.     Left Sinus: Frontal sinus tenderness present.     Comments: Mild ttp left frontal region.    Mouth/Throat:     Pharynx: Uvula midline. No oropharyngeal exudate or posterior oropharyngeal erythema.     Tonsils: No tonsillar abscesses.  Eyes:     General: Lids are normal.     Extraocular Movements: Extraocular movements intact.     Right eye: Normal extraocular motion.     Left eye: Normal extraocular motion.     Conjunctiva/sclera: Conjunctivae normal.     Left eye: Left conjunctiva is not injected. No chemosis.    Pupils: Pupils are equal, round, and reactive to light.  Cardiovascular:     Rate and Rhythm: Normal rate.     Heart sounds: Normal heart sounds.  Pulmonary:     Effort: Pulmonary effort is normal. No respiratory distress.     Breath sounds: No wheezing or rales.  Abdominal:     Palpations: Abdomen is soft.     Tenderness: There is no abdominal tenderness.  Musculoskeletal:        General: Normal range of motion.     Cervical back: Full passive range of motion without pain.  Skin:    General: Skin is warm and dry.     Findings: No rash.  Neurological:     Mental Status: She is alert and oriented to person, place, and time.     ED Results / Procedures / Treatments   Labs (all labs ordered are listed, but only abnormal results are displayed) Labs Reviewed - No data to display  EKG None  Radiology No results found.  Procedures Procedures (including critical care time)  Medications Ordered in ED Medications - No data to display  ED Course  I have reviewed the  triage vital signs and the nursing notes.  Pertinent labs & imaging results that were available during my care of the patient were reviewed by me and considered in my medical decision making (see chart for details).    MDM Rules/Calculators/A&P                          Exam suggesting possible early  Left frontal sinusitis vs viral uri. Placed on zithromax,  Discussed home tx including decongestant, steam tx.  No exam findings of periorbital edema or  cellulitis.   Final Clinical Impression(s) / ED Diagnoses Final diagnoses:  Acute non-recurrent frontal sinusitis    Rx / DC Orders ED Discharge Orders         Ordered    azithromycin (ZITHROMAX) 250 MG tablet  Daily        10/17/19 1843           Victoriano Lain 10/19/19 1933    Eber Hong, MD 10/25/19 [id number]

## 2020-02-09 ENCOUNTER — Other Ambulatory Visit: Payer: Self-pay

## 2020-02-09 ENCOUNTER — Encounter (HOSPITAL_COMMUNITY): Payer: Self-pay

## 2020-02-09 ENCOUNTER — Emergency Department (HOSPITAL_COMMUNITY)
Admission: EM | Admit: 2020-02-09 | Discharge: 2020-02-09 | Disposition: A | Discharge status: Home / Self Care | Payer: HRSA Program | Attending: Emergency Medicine | Admitting: Emergency Medicine

## 2020-02-09 DIAGNOSIS — R519 Headache, unspecified: Secondary | ICD-10-CM | POA: Diagnosis present

## 2020-02-09 DIAGNOSIS — F17210 Nicotine dependence, cigarettes, uncomplicated: Secondary | ICD-10-CM | POA: Diagnosis not present

## 2020-02-09 DIAGNOSIS — Z20822 Contact with and (suspected) exposure to covid-19: Secondary | ICD-10-CM

## 2020-02-09 DIAGNOSIS — U071 COVID-19: Secondary | ICD-10-CM | POA: Insufficient documentation

## 2020-02-09 MED ORDER — ACETAMINOPHEN 325 MG PO TABS
650.0000 mg | ORAL_TABLET | Freq: Once | ORAL | Status: AC
  Administered 2020-02-09: 650 mg via ORAL
  Filled 2020-02-09: qty 2

## 2020-02-09 MED ORDER — ONDANSETRON HCL 4 MG PO TABS: 4.0000 mg | ORAL_TABLET | Freq: Three times a day (TID) | ORAL | 0 refills | Status: DC | PRN

## 2020-02-09 NOTE — ED Provider Notes (Signed)
[facility]-EMERGENCY DEPT Provider Note   CSN: [id number] Arrival date & time: 02/09/20  0703     History Chief Complaint  Patient presents with  . Covid Exposure    Alexandra Floyd is a 55 y.o. female.  HPI   55 year old female presents the emergency department with concern for COVID exposure.  Patient states that her boyfriend and stepson are both positive for COVID.  Couple days ago she started having headaches, body aches, chills and a couple episodes of diarrhea.  No documented fever.  She has not been tested for COVID.  States she is not vaccinated.  Denies any chest pain, difficulty breathing, cough.  Past Medical History:  Diagnosis Date  . Cellulitis   . Hep C w/ coma, chronic   . MRSA cellulitis     Patient Active Problem List   Diagnosis Date Noted  . Substance induced mood disorder (HCC) 09/21/2019  . Polysubstance abuse (HCC) 09/21/2019  . 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 Cocaine use disorder, mild, abuse (HCC) 09/21/2019  . 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 MDD (major depressive disorder), recurrent episode, severe (HCC) 09/20/2019    Past Surgical History:  Procedure Laterality Date  . arm sx    . CHOLECYSTECTOMY       OB History   No obstetric history on file.     History reviewed. No pertinent family history.  Social History   Tobacco Use  . Smoking status: Current Every Day Smoker    Packs/day: 0.50    Types: Cigarettes  . Smokeless tobacco: Never Used  Vaping Use  . Vaping Use: Never used  Substance Use Topics  . Alcohol use: Yes    Alcohol/week: 42.0 standard drinks    Types: 42 Cans of beer per week    Comment: social  . Drug use: Yes    Frequency: 4.0 times per week    Types: Cocaine    Home Medications Prior to Admission  medications   Medication Sig Start Date End Date Taking? Authorizing Provider  ondansetron (ZOFRAN) 4 MG tablet Take 1 tablet (4 mg total) by mouth every 8 (eight) hours as needed for nausea or vomiting. 02/09/20  Yes Jackqulyn Mendel, Clabe Seal, DO  amantadine (SYMMETREL) 100 MG capsule Take 1 capsule (100 mg total) by mouth 2 (two) times daily. For cocaine cravings 09/25/19   Armandina Stammer I, NP  azithromycin (ZITHROMAX) 250 MG tablet Take 1 tablet (250 mg total) by mouth daily. Take first 2 tablets together, then 1 every day until finished. 10/17/19   Burgess Amor, PA-C  DULoxetine (CYMBALTA) 30 MG capsule Take 1 capsule (30 mg total) by mouth daily. For depression 09/25/19   Armandina Stammer I, NP  nicotine (NICODERM CQ - DOSED IN MG/24 HOURS) 21 mg/24hr patch Place 1 patch (21 mg total) onto the skin daily. (May buy from over the counter): For smoking cessation 09/25/19   Armandina Stammer I, NP  traZODone (DESYREL) 100 MG tablet Take 1 tablet (100 mg total) by mouth at bedtime as needed for sleep. 09/25/19   Armandina Stammer I, NP    Allergies    Lasix [furosemide]  Review of Systems   Review of Systems  Constitutional: Positive for appetite change, chills and fatigue. Negative for fever.  HENT: Negative for congestion.   Eyes: Negative for visual disturbance.  Respiratory: Negative for  shortness of breath.   Cardiovascular: Negative for chest pain.  Gastrointestinal: Positive for diarrhea. Negative for abdominal pain and vomiting.  Genitourinary: Negative for dysuria.  Musculoskeletal: Positive for myalgias.  Skin: Negative for rash.  Neurological: Positive for headaches.    Physical Exam Updated Vital Signs BP 122/84 (BP Location: Left Arm)   Pulse 89   Temp 99.2 F (37.3 C) (Oral)   Resp 16   Ht 5\' 3"  (1.6 m)   Wt 83.1 kg   SpO2 98%   BMI 32.43 kg/m   Physical Exam Vitals and nursing note reviewed.  Constitutional:      Appearance: Normal appearance.  HENT:     Head: Normocephalic.      Mouth/Throat:     Mouth: Mucous membranes are moist.  Eyes:     Extraocular Movements: Extraocular movements intact.  Cardiovascular:     Rate and Rhythm: Normal rate.  Pulmonary:     Effort: Pulmonary effort is normal. No respiratory distress.     Breath sounds: No wheezing or rales.  Abdominal:     Palpations: Abdomen is soft.     Tenderness: There is no abdominal tenderness.  Musculoskeletal:        General: No swelling.     Cervical back: No rigidity.  Skin:    General: Skin is warm.  Neurological:     Mental Status: She is alert and oriented to person, place, and time. Mental status is at baseline.  Psychiatric:        Mood and Affect: Mood normal.     ED Results / Procedures / Treatments   Labs (all labs ordered are listed, but only abnormal results are displayed) Labs Reviewed  SARS CORONAVIRUS 2 (TAT 6-24 HRS)    EKG None  Radiology No results found.  Procedures Procedures (including critical care time)  Medications Ordered in ED Medications  acetaminophen (TYLENOL) tablet 650 mg (has no administration in time range)    ED Course  I have reviewed the triage vital signs and the nursing notes.  Pertinent labs & imaging results that were available during my care of the patient were reviewed by me and considered in my medical decision making (see chart for details).    MDM Rules/Calculators/A&P                          Vital signs are stable.  Patient's symptoms are mild, she is sitting up, conversational, well-appearing.  She is in no distress, has no respiratory symptoms.  No need for any further emergent blood work/imaging.  COVID swab has been sent, discussed with the patient the possibility of Covid infection.  Educated the patient on Covid precautions, treatments and expectations.  Patient is stable for discharge and treatment as an outpatient. Patient agrees with the discharge plan/strict return precautions and verbalizes understanding.  Final  Clinical Impression(s) / ED Diagnoses Final diagnoses:  Suspected COVID-19 virus infection    Rx / DC Orders ED Discharge Orders         Ordered    ondansetron (ZOFRAN) 4 MG tablet  Every 8 hours PRN        02/09/20 1959           Jozalyn Baglio, 04/08/20, DO 02/09/20 2013

## 2020-02-09 NOTE — Discharge Instructions (Addendum)
You have been tested for Covid.  Your symptoms could be related to Covid infection.  We recommend that you self isolate and quarantine until you receive the results of this test.  Treat yourself symptomatically, stay well-hydrated.  Take any new prescriptions as directed, continue to take home medications as prescribed.  If you have any worsening symptoms, severe chest pain, difficulty breathing or further concerns for your health please return to the emergency department for further evaluation.

## 2020-02-09 NOTE — ED Notes (Signed)
Pt was not in lobby when called for recheck vitals, but came in shortly after asking if we had called her. Explained to pt she needs to stay in lobby if she is wanting to be seen.

## 2020-02-09 NOTE — ED Triage Notes (Signed)
Patient states she has been exposed to Covid .  patient c/o nausea, headache, chills, body aches, and diarrhea x 2 days.

## 2020-02-10 LAB — SARS CORONAVIRUS 2 (TAT 6-24 HRS): SARS Coronavirus 2: POSITIVE — AB

## 2020-02-11 ENCOUNTER — Other Ambulatory Visit: Payer: Self-pay

## 2020-02-11 ENCOUNTER — Encounter (HOSPITAL_COMMUNITY): Payer: Self-pay | Admitting: Emergency Medicine

## 2020-02-11 DIAGNOSIS — F1410 Cocaine abuse, uncomplicated: Secondary | ICD-10-CM | POA: Diagnosis not present

## 2020-02-11 DIAGNOSIS — D696 Thrombocytopenia, unspecified: Secondary | ICD-10-CM | POA: Insufficient documentation

## 2020-02-11 DIAGNOSIS — R509 Fever, unspecified: Secondary | ICD-10-CM | POA: Diagnosis present

## 2020-02-11 DIAGNOSIS — F17210 Nicotine dependence, cigarettes, uncomplicated: Secondary | ICD-10-CM | POA: Diagnosis not present

## 2020-02-11 DIAGNOSIS — R4701 Aphasia: Secondary | ICD-10-CM | POA: Diagnosis not present

## 2020-02-11 DIAGNOSIS — Z79899 Other long term (current) drug therapy: Secondary | ICD-10-CM | POA: Insufficient documentation

## 2020-02-11 DIAGNOSIS — F1010 Alcohol abuse, uncomplicated: Secondary | ICD-10-CM | POA: Insufficient documentation

## 2020-02-11 DIAGNOSIS — D72819 Decreased white blood cell count, unspecified: Secondary | ICD-10-CM | POA: Insufficient documentation

## 2020-02-11 DIAGNOSIS — U071 COVID-19: Secondary | ICD-10-CM | POA: Insufficient documentation

## 2020-02-11 NOTE — ED Triage Notes (Signed)
Pt is Covid positive and is here to get help with detox from cocaine and alcohol.  Pt last smoked crack and drank alcohol today.  Pt denies SI/HI.

## 2020-02-12 ENCOUNTER — Emergency Department (HOSPITAL_COMMUNITY)
Admission: EM | Admit: 2020-02-12 | Discharge: 2020-02-12 | Disposition: A | Discharge status: Home / Self Care | Payer: HRSA Program | Attending: Emergency Medicine | Admitting: Emergency Medicine

## 2020-02-12 DIAGNOSIS — U071 COVID-19: Secondary | ICD-10-CM

## 2020-02-12 DIAGNOSIS — D72819 Decreased white blood cell count, unspecified: Secondary | ICD-10-CM

## 2020-02-12 DIAGNOSIS — F1410 Cocaine abuse, uncomplicated: Secondary | ICD-10-CM

## 2020-02-12 DIAGNOSIS — D696 Thrombocytopenia, unspecified: Secondary | ICD-10-CM

## 2020-02-12 DIAGNOSIS — F1010 Alcohol abuse, uncomplicated: Secondary | ICD-10-CM

## 2020-02-12 DIAGNOSIS — R7401 Elevation of levels of liver transaminase levels: Secondary | ICD-10-CM

## 2020-02-12 LAB — CBC WITH DIFFERENTIAL/PLATELET
Abs Immature Granulocytes: 0.01 10*3/uL (ref 0.00–0.07)
Basophils Absolute: 0 10*3/uL (ref 0.0–0.1)
Basophils Relative: 1 %
Eosinophils Absolute: 0 10*3/uL (ref 0.0–0.5)
Eosinophils Relative: 1 %
HCT: 41.9 % (ref 36.0–46.0)
Hemoglobin: 13.4 g/dL (ref 12.0–15.0)
Immature Granulocytes: 0 %
Lymphocytes Relative: 58 %
Lymphs Abs: 1.7 10*3/uL (ref 0.7–4.0)
MCH: 31.7 pg (ref 26.0–34.0)
MCHC: 32 g/dL (ref 30.0–36.0)
MCV: 99.1 fL (ref 80.0–100.0)
Monocytes Absolute: 0.5 10*3/uL (ref 0.1–1.0)
Monocytes Relative: 16 %
Neutro Abs: 0.7 10*3/uL — ABNORMAL LOW (ref 1.7–7.7)
Neutrophils Relative %: 24 %
Platelets: 126 10*3/uL — ABNORMAL LOW (ref 150–400)
RBC: 4.23 MIL/uL (ref 3.87–5.11)
RDW: 13.2 % (ref 11.5–15.5)
WBC: 3 10*3/uL — ABNORMAL LOW (ref 4.0–10.5)
nRBC: 0 % (ref 0.0–0.2)

## 2020-02-12 LAB — RAPID URINE DRUG SCREEN, HOSP PERFORMED
Amphetamines: NOT DETECTED
Barbiturates: NOT DETECTED
Benzodiazepines: NOT DETECTED
Cocaine: POSITIVE — AB
Opiates: NOT DETECTED
Tetrahydrocannabinol: NOT DETECTED

## 2020-02-12 LAB — COMPREHENSIVE METABOLIC PANEL
ALT: 54 U/L — ABNORMAL HIGH (ref 0–44)
AST: 39 U/L (ref 15–41)
Albumin: 3.7 g/dL (ref 3.5–5.0)
Alkaline Phosphatase: 73 U/L (ref 38–126)
Anion gap: 9 (ref 5–15)
BUN: 8 mg/dL (ref 6–20)
CO2: 21 mmol/L — ABNORMAL LOW (ref 22–32)
Calcium: 8.3 mg/dL — ABNORMAL LOW (ref 8.9–10.3)
Chloride: 106 mmol/L (ref 98–111)
Creatinine, Ser: 0.79 mg/dL (ref 0.44–1.00)
GFR, Estimated: >60 mL/min (ref >60)
Glucose, Bld: 95 mg/dL (ref 70–99)
Potassium: 3.7 mmol/L (ref 3.5–5.1)
Sodium: 136 mmol/L (ref 135–145)
Total Bilirubin: 0.5 mg/dL (ref 0.3–1.2)
Total Protein: 7.6 g/dL (ref 6.5–8.1)

## 2020-02-12 LAB — ETHANOL: Alcohol, Ethyl (B): <10 mg/dL (ref <10)

## 2020-02-12 MED ORDER — KETOROLAC TROMETHAMINE 30 MG/ML IJ SOLN
30.0000 mg | Freq: Once | INTRAMUSCULAR | Status: AC
  Administered 2020-02-12: 30 mg via INTRAVENOUS
  Filled 2020-02-12: qty 1

## 2020-02-12 MED ORDER — ONDANSETRON HCL 4 MG/2ML IJ SOLN
4.0000 mg | Freq: Once | INTRAMUSCULAR | Status: AC
  Administered 2020-02-12: 4 mg via INTRAVENOUS
  Filled 2020-02-12: qty 2

## 2020-02-12 MED ORDER — LOPERAMIDE HCL 2 MG PO CAPS
4.0000 mg | ORAL_CAPSULE | Freq: Once | ORAL | Status: AC
  Administered 2020-02-12: 4 mg via ORAL
  Filled 2020-02-12: qty 2

## 2020-02-12 MED ORDER — LACTATED RINGERS IV BOLUS
1000.0000 mL | Freq: Once | INTRAVENOUS | Status: AC
  Administered 2020-02-12: 1000 mL via INTRAVENOUS

## 2020-02-12 NOTE — ED Provider Notes (Signed)
[facility] EMERGENCY DEPARTMENT Provider Note   CSN: [id number] Arrival date & time: 02/11/20  1559   History Chief Complaint  Patient presents with  . Detox    Alexandra Floyd is a 55 y.o. female.  The history is provided by the patient.  She has history of substance abuse and comes in requesting detox from alcohol and cocaine.  She last used both earlier today but states that she had only consumed 32 ounces of beer today.  She also relates that she was recently diagnosed with COVID-19.  She started getting sick about 4 days ago with subjective fever, chills, sweats.  There has been a cough which is nonproductive.  She has had some nausea and is complaining significant diarrhea with approximately 6 loose bowel movements today.  She is also complaining generalized body aches.  She denies dyspnea and denies change in sense of smell or taste.  She denies current depression and denies homicidal and suicidal ideation.  Past Medical History:  Diagnosis Date  . Cellulitis   . Hep C w/ coma, chronic   . MRSA cellulitis     Patient Active Problem List   Diagnosis Date Noted  . Substance induced mood disorder (HCC) 09/21/2019  . Polysubstance abuse (HCC) 09/21/2019  . 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 Cocaine use disorder, mild, abuse (HCC) 09/21/2019  . 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 MDD (major depressive disorder), recurrent episode, severe (HCC) 09/20/2019    Past Surgical History:  Procedure Laterality Date  . arm sx    . CHOLECYSTECTOMY       OB History   No obstetric history on file.     History reviewed. No pertinent family history.  Social History   Tobacco Use  . Smoking status: Current Every Day Smoker    Packs/day: 0.50    Types: Cigarettes  . Smokeless tobacco: Never Used  Vaping  Use  . Vaping Use: Never used  Substance Use Topics  . Alcohol use: Yes    Alcohol/week: 42.0 standard drinks    Types: 42 Cans of beer per week    Comment: social  . Drug use: Yes    Frequency: 4.0 times per week    Types: Cocaine    Home Medications Prior to Admission medications   Medication Sig Start Date End Date Taking? Authorizing Provider  amantadine (SYMMETREL) 100 MG capsule Take 1 capsule (100 mg total) by mouth 2 (two) times daily. For cocaine cravings 09/25/19   Armandina Stammer I, NP  azithromycin (ZITHROMAX) 250 MG tablet Take 1 tablet (250 mg total) by mouth daily. Take first 2 tablets together, then 1 every day until finished. 10/17/19   Burgess Amor, PA-C  DULoxetine (CYMBALTA) 30 MG capsule Take 1 capsule (30 mg total) by mouth daily. For depression 09/25/19   Armandina Stammer I, NP  nicotine (NICODERM CQ - DOSED IN MG/24 HOURS) 21 mg/24hr patch Place 1 patch (21 mg total) onto the skin daily. (May buy from over the counter): For smoking cessation 09/25/19   Armandina Stammer I, NP  ondansetron (ZOFRAN) 4 MG tablet Take 1 tablet (4 mg total) by mouth every 8 (eight) hours as needed for nausea or vomiting. 02/09/20   Horton, Clabe Seal, DO  traZODone (DESYREL) 100 MG tablet Take 1 tablet (100 mg total) by mouth at bedtime as needed for sleep. 09/25/19  Armandina Stammer I, NP    Allergies    Lasix [furosemide]  Review of Systems   Review of Systems  All other systems reviewed and are negative.   Physical Exam Updated Vital Signs BP (!) 130/96 (BP Location: Right Arm)   Pulse 71   Temp 98.1 F (36.7 C) (Oral)   Resp 18   Ht 5\' 3"  (1.6 m)   Wt 83.1 kg   SpO2 100%   BMI 32.43 kg/m   Physical Exam Vitals and nursing note reviewed.   55 year old female, resting comfortably and in no acute distress. Vital signs are significant for mildly elevated blood pressure. Oxygen saturation is 100%, which is normal. Head is normocephalic and atraumatic. PERRLA, EOMI. Oropharynx is clear. Neck  is nontender and supple without adenopathy or JVD. Back is nontender and there is no CVA tenderness. Lungs are clear without rales, wheezes, or rhonchi. Chest is nontender. Heart has regular rate and rhythm without murmur. Abdomen is soft, flat, nontender without masses or hepatosplenomegaly and peristalsis is normoactive. Extremities have no cyanosis or edema, full range of motion is present. Skin is warm and dry without rash. Neurologic: Mental status is normal, cranial nerves are intact, there are no motor or sensory deficits.  ED Results / Procedures / Treatments   Labs (all labs ordered are listed, but only abnormal results are displayed) Labs Reviewed  COMPREHENSIVE METABOLIC PANEL - Abnormal; Notable for the following components:      Result Value   CO2 21 (*)    Calcium 8.3 (*)    ALT 54 (*)    All other components within normal limits  CBC WITH DIFFERENTIAL/PLATELET - Abnormal; Notable for the following components:   WBC 3.0 (*)    Platelets 126 (*)    Neutro Abs 0.7 (*)    All other components within normal limits  RAPID URINE DRUG SCREEN, HOSP PERFORMED - Abnormal; Notable for the following components:   Cocaine POSITIVE (*)    All other components within normal limits  ETHANOL   Procedures Procedures  Medications Ordered in ED Medications  ondansetron (ZOFRAN) injection 4 mg (has no administration in time range)  lactated ringers bolus 1,000 mL (has no administration in time range)  loperamide (IMODIUM) capsule 4 mg (has no administration in time range)  ketorolac (TORADOL) 30 MG/ML injection 30 mg (has no administration in time range)    ED Course  I have reviewed the triage vital signs and the nursing notes.  Pertinent labs & imaging results that were available during my care of the patient were reviewed by me and considered in my medical decision making (see chart for details).  MDM Rules/Calculators/A&P Cocaine and ethanol abuse.  COVID-19 infection  without evidence of severe disease.  Old records are reviewed showing recent ED visit with diagnosis of COVID-19.  We will check screening labs, give IV fluids, ondansetron, ketorolac and oral loperamide.  She will need to be referred to outpatient facilities for detox.  Labs show mild elevation of ALT, mild leukopenia and moderate thrombocytopenia.  These are probably all related to alcohol abuse.  She is medically cleared.  She is discharged with outpatient resources for detox.  Gracemarie Skeet was evaluated in Emergency Department on 02/12/2020 for the symptoms described in the history of present illness. She was evaluated in the context of the global COVID-19 pandemic, which necessitated consideration that the patient might be at risk for infection with the SARS-CoV-2 virus that causes COVID-19. Institutional protocols and  algorithms that pertain to the evaluation of patients at risk for COVID-19 are in a state of rapid change based on information released by regulatory bodies including the CDC and federal and state organizations. These policies and algorithms were followed during the patient's care in the ED.  Final Clinical Impression(s) / ED Diagnoses Final diagnoses:  Alcohol abuse  Cocaine abuse (HCC)  Elevated ALT measurement  Thrombocytopenia (HCC)  Leukopenia, unspecified type  COVID-19 virus infection    Rx / DC Orders ED Discharge Orders    None       Dione Booze, MD 02/12/20 [id number]

## 2020-04-08 ENCOUNTER — Encounter (HOSPITAL_COMMUNITY): Payer: Self-pay | Admitting: Emergency Medicine

## 2020-04-08 ENCOUNTER — Other Ambulatory Visit: Payer: Self-pay

## 2020-04-08 ENCOUNTER — Emergency Department (HOSPITAL_COMMUNITY): Payer: Self-pay

## 2020-04-08 ENCOUNTER — Emergency Department (HOSPITAL_COMMUNITY)
Admission: EM | Admit: 2020-04-08 | Discharge: 2020-04-08 | Disposition: A | Discharge status: Home / Self Care | Payer: Self-pay | Attending: Emergency Medicine | Admitting: Emergency Medicine

## 2020-04-08 DIAGNOSIS — F17210 Nicotine dependence, cigarettes, uncomplicated: Secondary | ICD-10-CM | POA: Insufficient documentation

## 2020-04-08 DIAGNOSIS — M25561 Pain in right knee: Secondary | ICD-10-CM

## 2020-04-08 DIAGNOSIS — M1711 Unilateral primary osteoarthritis, right knee: Secondary | ICD-10-CM | POA: Insufficient documentation

## 2020-04-08 MED ORDER — NAPROXEN 500 MG PO TABS: 500.0000 mg | ORAL_TABLET | Freq: Two times a day (BID) | ORAL | 0 refills | Status: DC

## 2020-04-08 MED ORDER — HYDROCODONE-ACETAMINOPHEN 5-325 MG PO TABS
1.0000 | ORAL_TABLET | Freq: Once | ORAL | Status: AC
  Administered 2020-04-08: 1 via ORAL
  Filled 2020-04-08: qty 1

## 2020-04-08 MED ORDER — KETOROLAC TROMETHAMINE 30 MG/ML IJ SOLN
30.0000 mg | Freq: Once | INTRAMUSCULAR | Status: AC
  Administered 2020-04-08: 30 mg via INTRAMUSCULAR
  Filled 2020-04-08: qty 1

## 2020-04-08 NOTE — ED Notes (Signed)
Pt ambulatory to er room number 23, pt c/o pain and swelling to R knee, states that her knee gave out yesterday, pt has full ROM, ice pack placed, pt awaits provider eval

## 2020-04-08 NOTE — Discharge Instructions (Addendum)
Your knee x-ray show no evidence of fracture or dislocation, they do show significant arthritis throughout your knee and some swelling.  Use knee brace and crutches, if pain is improving you may walk on your knee as tolerated.  Please use Naprosyn as prescribed to help with pain, you can also elevate and apply ice.  Please call to schedule follow-up appointment with orthopedics.  Return if you have significantly worsening pain or swelling, fevers, the knee becomes red and hot to the touch or any other new or concerning symptoms occur.

## 2020-04-08 NOTE — ED Provider Notes (Signed)
[facility] EMERGENCY DEPARTMENT Provider Note   CSN: [id number] Arrival date & time: 04/08/20  1115     History Chief Complaint  Patient presents with  . Knee Pain    Alexandra Floyd is a 55 y.o. female.  Alexandra Floyd is a 55 y.o. female with a history of hep C, cellulitis, polysubstance abuse, who presents to the emergency department for evaluation of right knee pain.  She reports about 3 days ago she was walking when she felt like her knee gave out to the side and she felt like it popped.  She was worried it came out of joint, but was able to continue walking on it.  Since then has reported increased pain and some swelling to the right knee.  No overlying redness or warmth, no fevers or chills.  She is continue to walk on the knee, reports she has had a slight limp.  Pain is worse with palpation and ambulation.  She is able to flex and extend the knee with some discomfort.  Denies any prior injury or surgery to the knee.  Reports that her ankle feels little funny but she thinks this is because she has been walking differently to favor her knee.  No pain at the hip.  No numbness, tingling or weakness.  She reports that she has tried some Aspercreme and ibuprofen with minimal improvement.  No other aggravating or alleviating factors.        Past Medical History:  Diagnosis Date  . Cellulitis   . Hep C w/ coma, chronic   . MRSA cellulitis     Patient Active Problem List   Diagnosis Date Noted  . Substance induced mood disorder (HCC) 09/21/2019  . Polysubstance abuse (HCC) 09/21/2019  . 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 Cocaine use disorder, mild, abuse (HCC) 09/21/2019  . 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 MDD (major depressive disorder), recurrent episode, severe (HCC) 09/20/2019    Past Surgical  History:  Procedure Laterality Date  . arm sx    . CHOLECYSTECTOMY       OB History   No obstetric history on file.     History reviewed. No pertinent family history.  Social History   Tobacco Use  . Smoking status: Current Every Day Smoker    Packs/day: 0.50    Types: Cigarettes  . Smokeless tobacco: Never Used  Vaping Use  . Vaping Use: Never used  Substance Use Topics  . Alcohol use: Yes    Alcohol/week: 42.0 standard drinks    Types: 42 Cans of beer per week    Comment: social  . Drug use: Yes    Frequency: 4.0 times per week    Types: Cocaine    Comment: 04/07/20 smoked crack    Home Medications Prior to Admission medications   Medication Sig Start Date End Date Taking? Authorizing Provider  amantadine (SYMMETREL) 100 MG capsule Take 1 capsule (100 mg total) by mouth 2 (two) times daily. For cocaine cravings 09/25/19   Armandina Stammer I, NP  azithromycin (ZITHROMAX) 250 MG tablet Take 1 tablet (250 mg total) by mouth daily. Take first 2 tablets together, then 1 every day until finished. 10/17/19   Burgess Amor, PA-C  DULoxetine (CYMBALTA) 30 MG capsule Take 1 capsule (30 mg total) by mouth daily. For depression 09/25/19   Sanjuana Kava, NP  nicotine (NICODERM CQ - DOSED IN MG/24 HOURS) 21 mg/24hr patch Place 1 patch (21 mg total) onto the skin daily. (May buy from over the counter): For smoking cessation 09/25/19   Armandina Stammer I, NP  ondansetron (ZOFRAN) 4 MG tablet Take 1 tablet (4 mg total) by mouth every 8 (eight) hours as needed for nausea or vomiting. 02/09/20   Horton, Clabe Seal, DO  traZODone (DESYREL) 100 MG tablet Take 1 tablet (100 mg total) by mouth at bedtime as needed for sleep. 09/25/19   Armandina Stammer I, NP    Allergies    Lasix [furosemide]  Review of Systems   Review of Systems  Constitutional: Negative for chills and fever.  Musculoskeletal: Positive for arthralgias and joint swelling.  Skin: Negative for color change, rash and wound.  Neurological:  Negative for weakness and numbness.  All other systems reviewed and are negative.   Physical Exam Updated Vital Signs BP 113/80 (BP Location: Left Arm)   Pulse 75   Temp 98 F (36.7 C) (Oral)   Resp 16   Ht 5\' 3"  (1.6 m)   Wt 82.8 kg   SpO2 99%   BMI 32.33 kg/m   Physical Exam Vitals and nursing note reviewed.  Constitutional:      General: She is not in acute distress.    Appearance: Normal appearance. She is well-developed and well-nourished. She is not ill-appearing or diaphoretic.  HENT:     Head: Normocephalic and atraumatic.  Eyes:     General:        Right eye: No discharge.        Left eye: No discharge.  Pulmonary:     Effort: Pulmonary effort is normal. No respiratory distress.  Musculoskeletal:        General: Swelling and tenderness present.     Comments: Right knee with mild effusion and tenderness over the anterior and medial knee, no overlying erythema or warmth.  Patient is able to fully flex and extend the knee with mild discomfort.  No significant laxity.  No tenderness or swelling at the hip or ankle.  Distal pulses 2+  Skin:    General: Skin is warm and dry.  Neurological:     Mental Status: She is alert and oriented to person, place, and time.     Coordination: Coordination normal.  Psychiatric:        Mood and Affect: Mood and affect and mood normal.        Behavior: Behavior normal.     ED Results / Procedures / Treatments   Labs (all labs ordered are listed, but only abnormal results are displayed) Labs Reviewed - No data to display  EKG None  Radiology DG Knee Complete 4 Views Right  Result Date: 04/08/2020 CLINICAL DATA:  Diffuse right knee pain and swelling after feeling a pop while walking a few days ago. EXAM: RIGHT KNEE - COMPLETE 4+ VIEW COMPARISON:  None. FINDINGS: No acute fracture or dislocation. Moderate joint effusion. Moderate lateral compartment joint space narrowing. Bulky tricompartmental marginal osteophytes. Multiple  intra-articular bodies in the posterior joint space. Soft tissues are unremarkable. IMPRESSION: 1. No acute osseous abnormality. Moderate joint effusion. 2. Tricompartmental osteoarthritis with intra-articular bodies. Electronically Signed   By: 06/08/2020 M.D.   On: 04/08/2020 13:10    Procedures Procedures   Medications Ordered in ED Medications  ketorolac (TORADOL) 30 MG/ML injection 30 mg (30 mg Intramuscular Given 04/08/20 1252)  HYDROcodone-acetaminophen (NORCO/VICODIN) 5-325 MG per tablet 1 tablet (  1 tablet Oral Given 04/08/20 1251)    ED Course  I have reviewed the triage vital signs and the nursing notes.  Pertinent labs & imaging results that were available during my care of the patient were reviewed by me and considered in my medical decision making (see chart for details).    MDM Rules/Calculators/A&P                         Patient X-Ray negative for obvious fracture or dislocation.  X-ray does show tricompartmental osteoarthritis could certainly be contributing to her symptoms.  Question whether she could have some soft tissue or ligamentous injury.  Pain managed in ED. Pt advised to follow up with orthopedics if symptoms persist for possibility of missed fracture diagnosis. Patient given knee immobilizer and crutches while in ED, conservative therapy recommended and discussed. Patient will be dc home & is agreeable with above plan.   Final Clinical Impression(s) / ED Diagnoses Final diagnoses:  Acute pain of right knee  Osteoarthritis of right knee, unspecified osteoarthritis type    Rx / DC Orders ED Discharge Orders    None       Dartha Lodge, [place] 04/08/20 1356    Vanetta Mulders, MD 04/21/20 [phone]

## 2020-04-08 NOTE — ED Triage Notes (Signed)
Pt c/o right knee pain for the last 3 days. Pt states her "knee popped out of the joint but must be back in now because I am walking"  Pt walks with a slight limp.

## 2020-05-28 ENCOUNTER — Emergency Department (HOSPITAL_COMMUNITY)
Admission: EM | Admit: 2020-05-28 | Discharge: 2020-05-28 | Disposition: A | Discharge status: Home / Self Care | Payer: Self-pay | Attending: Emergency Medicine | Admitting: Emergency Medicine

## 2020-05-28 ENCOUNTER — Encounter (HOSPITAL_COMMUNITY): Payer: Self-pay | Admitting: *Deleted

## 2020-05-28 ENCOUNTER — Other Ambulatory Visit: Payer: Self-pay

## 2020-05-28 DIAGNOSIS — F17210 Nicotine dependence, cigarettes, uncomplicated: Secondary | ICD-10-CM | POA: Insufficient documentation

## 2020-05-28 DIAGNOSIS — N3000 Acute cystitis without hematuria: Secondary | ICD-10-CM | POA: Insufficient documentation

## 2020-05-28 LAB — CBC WITH DIFFERENTIAL/PLATELET
Abs Immature Granulocytes: 0.01 10*3/uL (ref 0.00–0.07)
Basophils Absolute: 0.1 10*3/uL (ref 0.0–0.1)
Basophils Relative: 1 %
Eosinophils Absolute: 0.2 10*3/uL (ref 0.0–0.5)
Eosinophils Relative: 3 %
HCT: 41.7 % (ref 36.0–46.0)
Hemoglobin: 13.5 g/dL (ref 12.0–15.0)
Immature Granulocytes: 0 %
Lymphocytes Relative: 26 %
Lymphs Abs: 1.6 10*3/uL (ref 0.7–4.0)
MCH: 32.1 pg (ref 26.0–34.0)
MCHC: 32.4 g/dL (ref 30.0–36.0)
MCV: 99.3 fL (ref 80.0–100.0)
Monocytes Absolute: 0.6 10*3/uL (ref 0.1–1.0)
Monocytes Relative: 9 %
Neutro Abs: 3.8 10*3/uL (ref 1.7–7.7)
Neutrophils Relative %: 61 %
Platelets: 299 10*3/uL (ref 150–400)
RBC: 4.2 MIL/uL (ref 3.87–5.11)
RDW: 13.3 % (ref 11.5–15.5)
WBC: 6.3 10*3/uL (ref 4.0–10.5)
nRBC: 0 % (ref 0.0–0.2)

## 2020-05-28 LAB — URINALYSIS, ROUTINE W REFLEX MICROSCOPIC
Bilirubin Urine: NEGATIVE
Glucose, UA: NEGATIVE mg/dL
Ketones, ur: NEGATIVE mg/dL
Nitrite: NEGATIVE
Protein, ur: NEGATIVE mg/dL
Specific Gravity, Urine: 1.015 (ref 1.005–1.030)
WBC, UA: >50 WBC/hpf — ABNORMAL HIGH (ref 0–5)
pH: 6 (ref 5.0–8.0)

## 2020-05-28 LAB — BASIC METABOLIC PANEL
Anion gap: 8 (ref 5–15)
BUN: 17 mg/dL (ref 6–20)
CO2: 26 mmol/L (ref 22–32)
Calcium: 9.1 mg/dL (ref 8.9–10.3)
Chloride: 104 mmol/L (ref 98–111)
Creatinine, Ser: 0.98 mg/dL (ref 0.44–1.00)
GFR, Estimated: >60 mL/min (ref >60)
Glucose, Bld: 108 mg/dL — ABNORMAL HIGH (ref 70–99)
Potassium: 3.9 mmol/L (ref 3.5–5.1)
Sodium: 138 mmol/L (ref 135–145)

## 2020-05-28 MED ORDER — HYDROXYZINE HCL 25 MG PO TABS
25.0000 mg | ORAL_TABLET | Freq: Once | ORAL | Status: AC
  Administered 2020-05-28: 25 mg via ORAL
  Filled 2020-05-28: qty 1

## 2020-05-28 MED ORDER — CEPHALEXIN 500 MG PO CAPS: 500.0000 mg | ORAL_CAPSULE | Freq: Four times a day (QID) | ORAL | 0 refills | Status: DC

## 2020-05-28 MED ORDER — CEPHALEXIN 500 MG PO CAPS
500.0000 mg | ORAL_CAPSULE | Freq: Once | ORAL | Status: AC
  Administered 2020-05-28: 500 mg via ORAL
  Filled 2020-05-28: qty 1

## 2020-05-28 NOTE — ED Triage Notes (Signed)
Nausea, has been taking azo for possible UTI without relief

## 2020-05-28 NOTE — ED Notes (Signed)
Pt c/o burning with urination for over the past week.  Has been taking azo.  Also reports anxiety.

## 2020-05-28 NOTE — ED Provider Notes (Signed)
[facility] EMERGENCY DEPARTMENT Provider Note   CSN: [id number] Arrival date & time: 05/28/20  1530     History Chief Complaint  Patient presents with  . Nausea    Alexandra Floyd is a 55 y.o. female.  Patient complains of dysuria.  This been going on for a week.  She has also some suprapubic pain  The history is provided by the patient and medical records. No language interpreter was used.  Dysuria Pain quality:  Aching Pain severity:  Mild Onset quality:  Sudden Timing:  Constant Progression:  Unable to specify Chronicity:  New Recent urinary tract infections: no   Relieved by:  Nothing Worsened by:  Nothing Urinary symptoms: no discolored urine   Associated symptoms: abdominal pain        Past Medical History:  Diagnosis Date  . Cellulitis   . Hep C w/ coma, chronic   . MRSA cellulitis     Patient Active Problem List   Diagnosis Date Noted  . Substance induced mood disorder (HCC) 09/21/2019  . Polysubstance abuse (HCC) 09/21/2019  . 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 Cocaine use disorder, mild, abuse (HCC) 09/21/2019  . 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 MDD (major depressive disorder), recurrent episode, severe (HCC) 09/20/2019    Past Surgical History:  Procedure Laterality Date  . arm sx    . CHOLECYSTECTOMY       OB History   No obstetric history on file.     No family history on file.  Social History   Tobacco Use  . Smoking status: Current Every Day Smoker    Packs/day: 0.50    Types: Cigarettes  . Smokeless tobacco: Never Used  Vaping Use  . Vaping Use: Never used  Substance Use Topics  . Alcohol use: Yes    Alcohol/week: 42.0 standard drinks    Types: 42 Cans of beer per week    Comment: social  . Drug use: Yes    Frequency: 4.0 times per week     Types: Cocaine    Comment: 04/07/20 smoked crack    Home Medications Prior to Admission medications   Medication Sig Start Date End Date Taking? Authorizing Provider  cephALEXin (KEFLEX) 500 MG capsule Take 1 capsule (500 mg total) by mouth 4 (four) times daily. 05/28/20  Yes Bethann Berkshire, MD  amantadine (SYMMETREL) 100 MG capsule Take 1 capsule (100 mg total) by mouth 2 (two) times daily. For cocaine cravings 09/25/19   Armandina Stammer I, NP  azithromycin (ZITHROMAX) 250 MG tablet Take 1 tablet (250 mg total) by mouth daily. Take first 2 tablets together, then 1 every day until finished. 10/17/19   Burgess Amor, PA-C  DULoxetine (CYMBALTA) 30 MG capsule Take 1 capsule (30 mg total) by mouth daily. For depression 09/25/19   Armandina Stammer I, NP  naproxen (NAPROSYN) 500 MG tablet Take 1 tablet (500 mg total) by mouth 2 (two) times daily. 04/08/20   Dartha Lodge, PA-C  nicotine (NICODERM CQ - DOSED IN MG/24 HOURS) 21 mg/24hr patch Place 1 patch (21 mg total) onto the skin daily. (May buy from over the counter): For smoking cessation 09/25/19   Armandina Stammer I, NP  ondansetron (ZOFRAN) 4 MG tablet Take 1 tablet (4 mg total) by mouth every 8 (eight) hours as needed for nausea or vomiting. 02/09/20   Horton, Danford Bad  M, DO  traZODone (DESYREL) 100 MG tablet Take 1 tablet (100 mg total) by mouth at bedtime as needed for sleep. 09/25/19   Armandina Stammer I, NP    Allergies    Lasix [furosemide]  Review of Systems   Review of Systems  Constitutional: Negative for appetite change and fatigue.  HENT: Negative for congestion, ear discharge and sinus pressure.   Eyes: Negative for discharge.  Respiratory: Negative for cough.   Cardiovascular: Negative for chest pain.  Gastrointestinal: Positive for abdominal pain. Negative for diarrhea.  Genitourinary: Positive for dysuria. Negative for frequency and hematuria.  Musculoskeletal: Negative for back pain.  Skin: Negative for rash.  Neurological: Negative for seizures  and headaches.  Psychiatric/Behavioral: Negative for hallucinations.    Physical Exam Updated Vital Signs BP (!) 128/93 (BP Location: Right Arm)   Pulse 82   Temp 97.9 F (36.6 C) (Oral)   Resp 18   Ht 5\' 3"  (1.6 m)   Wt 84 kg   SpO2 94%   BMI 32.79 kg/m   Physical Exam Vitals reviewed.  Constitutional:      Appearance: She is well-developed.  HENT:     Head: Normocephalic.     Nose: Nose normal.  Eyes:     General: No scleral icterus.    Conjunctiva/sclera: Conjunctivae normal.  Neck:     Thyroid: No thyromegaly.  Cardiovascular:     Rate and Rhythm: Normal rate and regular rhythm.     Heart sounds: No murmur heard. No friction rub. No gallop.   Pulmonary:     Breath sounds: No stridor. No wheezing or rales.  Chest:     Chest wall: No tenderness.  Abdominal:     General: There is no distension.     Tenderness: There is abdominal tenderness. There is no rebound.  Musculoskeletal:        General: Normal range of motion.     Cervical back: Neck supple.  Lymphadenopathy:     Cervical: No cervical adenopathy.  Skin:    Findings: No erythema or rash.  Neurological:     Mental Status: She is alert and oriented to person, place, and time.     Motor: No abnormal muscle tone.     Coordination: Coordination normal.  Psychiatric:        Behavior: Behavior normal.     ED Results / Procedures / Treatments   Labs (all labs ordered are listed, but only abnormal results are displayed) Labs Reviewed  BASIC METABOLIC PANEL - Abnormal; Notable for the following components:      Result Value   Glucose, Bld 108 (*)    All other components within normal limits  URINALYSIS, ROUTINE W REFLEX MICROSCOPIC - Abnormal; Notable for the following components:   APPearance HAZY (*)    Hgb urine dipstick MODERATE (*)    Leukocytes,Ua LARGE (*)    WBC, UA >50 (*)    Bacteria, UA RARE (*)    All other components within normal limits  URINE CULTURE  CBC WITH DIFFERENTIAL/PLATELET     EKG None  Radiology No results found.  Procedures Procedures   Medications Ordered in ED Medications  cephALEXin (KEFLEX) capsule 500 mg (has no administration in time range)  hydrOXYzine (ATARAX/VISTARIL) tablet 25 mg (25 mg Oral Given 05/28/20 1617)    ED Course  I have reviewed the triage vital signs and the nursing notes.  Pertinent labs & imaging results that were available during my care of the patient were reviewed  by me and considered in my medical decision making (see chart for details).    MDM Rules/Calculators/A&P                         Patient with urinary tract infection.  She is placed on Keflex and will have a urine culture done.  She will follow-up with her PCP  Final Clinical Impression(s) / ED Diagnoses Final diagnoses:  Acute cystitis without hematuria    Rx / DC Orders ED Discharge Orders         Ordered    cephALEXin (KEFLEX) 500 MG capsule  4 times daily        05/28/20 1733           Bethann Berkshire, MD 05/28/20 1739

## 2020-05-28 NOTE — Discharge Instructions (Signed)
Follow-up with Dr. Despina Hidden or one of his partners in 1 to 2 weeks.  Drink plenty of fluids return if problems

## 2020-05-31 LAB — URINE CULTURE: Culture: >=100000 — AB

## 2020-06-01 ENCOUNTER — Telehealth: Payer: Self-pay | Admitting: Emergency Medicine

## 2020-06-01 NOTE — Telephone Encounter (Signed)
Post ED Visit - Positive Culture Follow-up  Culture report reviewed by antimicrobial stewardship pharmacist: [facility] []  Nathan Batchelder, Pharm.D. []  , Pharm.D., BCPS AQ-ID []  , Pharm.D., BCPS []  Celedonio Miyamoto, Pharm.D., BCPS []  [place]hel[place], Garvin Fila.D., BCPS, AAHIVP []  , Pharm.D., BCPS, AAHIVP []  Georgina Pillion, PharmD, BCPS []  , PharmD, BCPS []  [place], PharmD, BCPS []  [address], PharmD []  , PharmD, BCPS []  Estella Husk, PharmD  Pharmacy Team []  Lysle Pearl, PharmD []  , PharmD []  Phillips Climes, PharmD []  , Rph []  Agapito Games) , PharmD []  Verlan Friends, PharmD []  , PharmD []  Mervyn Gay, PharmD []  , PharmD []  Vinnie Level, PharmD []  [facility], PharmD []  , PharmD []  Len Childs, PharmD   Positive urine culture Treated with cephalexin, organism sensitive to the same and no further patient follow-up is required at this time.  06/01/2020, 12:39 PM

## 2020-09-27 ENCOUNTER — Other Ambulatory Visit: Payer: Self-pay

## 2020-09-27 ENCOUNTER — Emergency Department (HOSPITAL_COMMUNITY)
Admission: EM | Admit: 2020-09-27 | Discharge: 2020-09-27 | Disposition: A | Discharge status: Home / Self Care | Payer: Self-pay | Attending: Emergency Medicine | Admitting: Emergency Medicine

## 2020-09-27 ENCOUNTER — Encounter (HOSPITAL_COMMUNITY): Payer: Self-pay | Admitting: Emergency Medicine

## 2020-09-27 DIAGNOSIS — Z5321 Procedure and treatment not carried out due to patient leaving prior to being seen by health care provider: Secondary | ICD-10-CM | POA: Insufficient documentation

## 2020-09-27 DIAGNOSIS — W57XXXA Bitten or stung by nonvenomous insect and other nonvenomous arthropods, initial encounter: Secondary | ICD-10-CM | POA: Insufficient documentation

## 2020-09-27 DIAGNOSIS — S30860A Insect bite (nonvenomous) of lower back and pelvis, initial encounter: Secondary | ICD-10-CM | POA: Insufficient documentation

## 2020-09-27 NOTE — ED Triage Notes (Signed)
Pt to the ED with what she says is a spider bite on her buttocks. Pt states it happened a week ago.   Pt denies seeing a spider  This nurse observed what looks like a pimple on her buttock.

## 2020-09-28 NOTE — Congregational Nurse Program (Signed)
Pt attended [facility] to receive assistance with getting enrolled in the Care Connect Uninsured Program.    States that they last saw a medical provider at  [facility] ER  [phone].   Pt chief complaint(s) for wanting to access a medical provider is to receive  care a suspected insect bite that she states was a spider bite (but never saw what insect it was.   Pt admits to feelinng depressed intermittently and not being happy with the way her life at times and admit she may need to be  back on her meds.    Pt Admits medical hx of anemia, anxiety, depression, heart murmur , GERD and hx of substance abuse (with last use 2 weeks ago from today),  She states she is suppose to be on anti-anxiety and anti-depression meds, but chose to stop taking due the way that they make her feel and she doesn't like the feeling.      Vitals Signs Checked (see vitals section) and WNL.    Random Glucose Check (2 or more hours last ate or drink)  WNL (113)  Plan Completed on Today:   -Care Connect Enrollment/Eligibility completed by D.Leavy Cella  -Referral sent to RN Nurse Case Manager, Norval Gable, for initial follow up and continous medical case management after completion of  first appointment with the [facility] [place].    -Socio-determinant needs at this time identified food and mental.   Advised pt to re-connect with [facility] to get re-started with mental health provider and therapy.  Also referred to on-site Mohammed Kindle to receive food per her request.    -Scheduled initial PCP appointment with the [facility] [place], September 30 2020 at 10:45 am.   Instructions regarding appointment details were provided (i.e. bring medicine containers, cost of visit, arrival, etc)

## 2020-09-30 ENCOUNTER — Encounter: Payer: Self-pay | Admitting: Physician Assistant

## 2020-09-30 ENCOUNTER — Ambulatory Visit: Payer: Self-pay | Admitting: Physician Assistant

## 2020-09-30 ENCOUNTER — Telehealth: Payer: Self-pay

## 2020-09-30 ENCOUNTER — Other Ambulatory Visit (HOSPITAL_COMMUNITY)
Admission: RE | Admit: 2020-09-30 | Discharge: 2020-09-30 | Disposition: A | Discharge status: Home / Self Care | Payer: Self-pay | Source: Ambulatory Visit | Attending: Physician Assistant | Admitting: Physician Assistant

## 2020-09-30 VITALS — BP 111/89 | HR 89 | Temp 98.6°F | Ht 63.0 in | Wt 196.0 lb

## 2020-09-30 DIAGNOSIS — E785 Hyperlipidemia, unspecified: Secondary | ICD-10-CM

## 2020-09-30 DIAGNOSIS — R350 Frequency of micturition: Secondary | ICD-10-CM

## 2020-09-30 DIAGNOSIS — Z1239 Encounter for other screening for malignant neoplasm of breast: Secondary | ICD-10-CM

## 2020-09-30 DIAGNOSIS — F1910 Other psychoactive substance abuse, uncomplicated: Secondary | ICD-10-CM

## 2020-09-30 DIAGNOSIS — K219 Gastro-esophageal reflux disease without esophagitis: Secondary | ICD-10-CM

## 2020-09-30 DIAGNOSIS — Z1211 Encounter for screening for malignant neoplasm of colon: Secondary | ICD-10-CM

## 2020-09-30 DIAGNOSIS — R7989 Other specified abnormal findings of blood chemistry: Secondary | ICD-10-CM

## 2020-09-30 DIAGNOSIS — F489 Nonpsychotic mental disorder, unspecified: Secondary | ICD-10-CM

## 2020-09-30 DIAGNOSIS — H6121 Impacted cerumen, right ear: Secondary | ICD-10-CM

## 2020-09-30 DIAGNOSIS — Z7689 Persons encountering health services in other specified circumstances: Secondary | ICD-10-CM

## 2020-09-30 DIAGNOSIS — E669 Obesity, unspecified: Secondary | ICD-10-CM

## 2020-09-30 DIAGNOSIS — F17200 Nicotine dependence, unspecified, uncomplicated: Secondary | ICD-10-CM

## 2020-09-30 LAB — COMPREHENSIVE METABOLIC PANEL
ALT: 35 U/L (ref 0–44)
AST: 28 U/L (ref 15–41)
Albumin: 4.3 g/dL (ref 3.5–5.0)
Alkaline Phosphatase: 91 U/L (ref 38–126)
Anion gap: 10 (ref 5–15)
BUN: 12 mg/dL (ref 6–20)
CO2: 25 mmol/L (ref 22–32)
Calcium: 9.4 mg/dL (ref 8.9–10.3)
Chloride: 103 mmol/L (ref 98–111)
Creatinine, Ser: 0.72 mg/dL (ref 0.44–1.00)
GFR, Estimated: >60 mL/min (ref >60)
Glucose, Bld: 96 mg/dL (ref 70–99)
Potassium: 4.3 mmol/L (ref 3.5–5.1)
Sodium: 138 mmol/L (ref 135–145)
Total Bilirubin: 0.4 mg/dL (ref 0.3–1.2)
Total Protein: 8.9 g/dL — ABNORMAL HIGH (ref 6.5–8.1)

## 2020-09-30 LAB — URINALYSIS, DIPSTICK ONLY
Bilirubin Urine: NEGATIVE
Glucose, UA: NEGATIVE mg/dL
Ketones, ur: NEGATIVE mg/dL
Leukocytes,Ua: NEGATIVE
Nitrite: NEGATIVE
Protein, ur: NEGATIVE mg/dL
Specific Gravity, Urine: 1.004 — ABNORMAL LOW (ref 1.005–1.030)
pH: 7 (ref 5.0–8.0)

## 2020-09-30 LAB — LIPID PANEL
Cholesterol: 212 mg/dL — ABNORMAL HIGH (ref 0–200)
HDL: 43 mg/dL (ref >40)
LDL Cholesterol: 117 mg/dL — ABNORMAL HIGH (ref 0–99)
Total CHOL/HDL Ratio: 4.9 RATIO
Triglycerides: 262 mg/dL — ABNORMAL HIGH (ref <150)
VLDL: 52 mg/dL — ABNORMAL HIGH (ref 0–40)

## 2020-09-30 LAB — TSH: TSH: 4.933 u[IU]/mL — ABNORMAL HIGH (ref 0.350–4.500)

## 2020-09-30 MED ORDER — PANTOPRAZOLE SODIUM 40 MG PO TBEC: 40.0000 mg | DELAYED_RELEASE_TABLET | Freq: Every day | ORAL | 3 refills | Status: DC

## 2020-09-30 NOTE — Progress Notes (Signed)
BP 111/89   Pulse 89   Temp 98.6 F (37 C)   Ht 5\' 3"  (1.6 m)   Wt 196 lb (88.9 kg)   SpO2 98%   BMI 34.72 kg/m    Subjective:    Patient ID: , female    DOB: 05-14-1965, 55 y.o.   MRN: [id number]  HPI: Alexandra Floyd is a 55 y.o. female presenting on 09/30/2020 for New Patient (Initial Visit)   HPI   Pt had a negative covid 19 screening questionnaire.  Chief Complaint  Patient presents with   New Patient (Initial Visit)     Pt is 54yoF who presents to establish care.    Pt states her Last menses 2 or 3  year ago but she feels like she is pregnant.  She says she has Gained 50 pounds.  She says she has Heartburn all the time.  She also says she Pees all the time.    Hasnt' been to AA lately but is planning to return.   Pt reprts a lump on butt cheek.  That is what she went to ER for on 09/27/20.   She says it has Been there about a week.  It popped on it's own but it's still sore.  She is sure it is a spider bite.  Pt says No pain with urination just goes frequently.  She has Some lower abd pain, almost suprapubic.  She thinks her last mammogram was 3 or 4 years ago.  That was in [place].   Labs from 8/21- a1c normal, trig elevated, tsh abnormal.    She has not got covid vaccination  She is not going anywhere for counseling.  She hasn't been there for about 9 months.   She stopped meds because she didn't like the way the meds made her feel.   She didn't discuss it with her doctor, she just stopped going.   she thinks her weight gain started about a year ago.       Relevant past medical, surgical, family and social history reviewed and updated as indicated. Interim medical history since our last visit reviewed. Allergies and medications reviewed and updated.  CURRENT MEDS: none  Review of Systems  Per HPI unless specifically indicated above     Objective:    BP 111/89   Pulse 89   Temp 98.6 F (37 C)   Ht 5\' 3"  (1.6 m)   Wt 196 lb  (88.9 kg)   SpO2 98%   BMI 34.72 kg/m   Wt Readings from Last 3 Encounters:  09/30/20 196 lb (88.9 kg)  09/27/20 185 lb 3 oz (84 kg)  05/28/20 185 lb 2 oz (84 kg)    Physical Exam Vitals reviewed.  Constitutional:      General: She is not in acute distress.    Appearance: She is well-developed. She is not ill-appearing.  HENT:     Head: Normocephalic and atraumatic.     Right Ear: Tympanic membrane, ear canal and external ear normal. There is impacted cerumen.     Left Ear: Tympanic membrane, ear canal and external ear normal.     Mouth/Throat:     Pharynx: No oropharyngeal exudate.  Eyes:     Conjunctiva/sclera: Conjunctivae normal.     Pupils: Pupils are equal, round, and reactive to light.  Neck:     Thyroid: No thyromegaly.  Cardiovascular:     Rate and Rhythm: Normal rate and regular rhythm.  Pulmonary:  Effort: Pulmonary effort is normal.     Breath sounds: Normal breath sounds.  Abdominal:     General: Bowel sounds are normal.     Palpations: Abdomen is soft. There is no mass.     Tenderness: There is no abdominal tenderness. There is no guarding or rebound.  Musculoskeletal:     Right wrist: Deformity present. Decreased range of motion.     Cervical back: Neck supple.     Right lower leg: No edema.     Left lower leg: No edema.     Comments: There is large well-healed scar right forearm and wrist. Right thumb shortened and with decreased ROM.  Several fingers function abnormal as well.  This all due to old injury  Lymphadenopathy:     Cervical: No cervical adenopathy.  Skin:    General: Skin is warm and dry.     Comments: Small healing pimple right buttock.  No redness or fluctuance or drainage Enrique Sack chaperone)  Neurological:     Mental Status: She is alert and oriented to person, place, and time.     Motor: No weakness or tremor.     Gait: Gait is intact. Gait normal.     Deep Tendon Reflexes:     Reflex Scores:      Patellar reflexes are 2+ on the  right side and 2+ on the left side. Psychiatric:        Speech: Speech normal.        Behavior: Behavior is cooperative.     Comments: Pt very anxious and jumpy           Assessment & Plan:   Encounter Diagnoses  Name Primary?   Encounter to establish care Yes   Hyperlipidemia, unspecified hyperlipidemia type    Abnormal TSH    Impacted cerumen of right ear    Screening for colon cancer    Encounter for screening for malignant neoplasm of breast, unspecified screening modality    Gastroesophageal reflux disease, unspecified whether esophagitis present    Obesity, unspecified classification, unspecified obesity type, unspecified whether serious comorbidity present    Polysubstance abuse (HCC)    Tobacco use disorder    Mental health problem    Urinary frequency        -will update Labs -will refer for screening Mammogram -pt is given FIT test for colon cancer screening  -pt is counseled on GERD and lifestyle changes; rx protonix -pt urged to Return to [facility] for help with MH issues -Encouraged covid vaccination -Ear lavage done and pt happy with improvement -pt to follow up  1 month.  She is to contact office sooner prn

## 2020-09-30 NOTE — Patient Instructions (Signed)
Gastroesophageal Reflux Disease, Adult ?Gastroesophageal reflux (GER) happens when acid from the stomach flows up into the tube that connects the mouth and the stomach (esophagus). Normally, food travels down the esophagus and stays in the stomach to be digested. However, when a person has GER, food and stomach acid sometimes move back up into the esophagus. If this becomes a more serious problem, the person may be diagnosed with a disease called gastroesophageal reflux disease (GERD). GERD occurs when the reflux: ?Happens often. ?Causes frequent or severe symptoms. ?Causes problems such as damage to the esophagus. ?When stomach acid comes in contact with the esophagus, the acid may cause inflammation in the esophagus. Over time, GERD may create small holes (ulcers) in the lining of the esophagus. ?What are the causes? ?This condition is caused by a problem with the muscle between the esophagus and the stomach (lower esophageal sphincter, or LES). Normally, the LES muscle closes after food passes through the esophagus to the stomach. When the LES is weakened or abnormal, it does not close properly, and that allows food and stomach acid to go back up into the esophagus. ?The LES can be weakened by certain dietary substances, medicines, and medical conditions, including: ?Tobacco use. ?Pregnancy. ?Having a hiatal hernia. ?Alcohol use. ?Certain foods and beverages, such as coffee, chocolate, onions, and peppermint. ?What increases the risk? ?You are more likely to develop this condition if you: ?Have an increased body weight. ?Have a connective tissue disorder. ?Take NSAIDs, such as ibuprofen. ?What are the signs or symptoms? ?Symptoms of this condition include: ?Heartburn. ?Difficult or painful swallowing and the feeling of having a lump in the throat. ?A bitter taste in the mouth. ?Bad breath and having a large amount of saliva. ?Having an upset or bloated stomach and belching. ?Chest pain. Different conditions can  cause chest pain. Make sure you see your health care provider if you experience chest pain. ?Shortness of breath or wheezing. ?Ongoing (chronic) cough or a nighttime cough. ?Wearing away of tooth enamel. ?Weight loss. ?How is this diagnosed? ?This condition may be diagnosed based on a medical history and a physical exam. To determine if you have mild or severe GERD, your health care provider may also monitor how you respond to treatment. You may also have tests, including: ?A test to examine your stomach and esophagus with a small camera (endoscopy). ?A test that measures the acidity level in your esophagus. ?A test that measures how much pressure is on your esophagus. ?A barium swallow or modified barium swallow test to show the shape, size, and functioning of your esophagus. ?How is this treated? ?Treatment for this condition may vary depending on how severe your symptoms are. Your health care provider may recommend: ?Changes to your diet. ?Medicine. ?Surgery. ?The goal of treatment is to help relieve your symptoms and to prevent complications. ?Follow these instructions at home: ?Eating and drinking ? ?Follow a diet as recommended by your health care provider. This may involve avoiding foods and drinks such as: ?Coffee and tea, with or without caffeine. ?Drinks that contain alcohol. ?Energy drinks and sports drinks. ?Carbonated drinks or sodas. ?Chocolate and cocoa. ?Peppermint and mint flavorings. ?Garlic and onions. ?Horseradish. ?Spicy and acidic foods, including peppers, chili powder, curry powder, vinegar, hot sauces, and barbecue sauce. ?Citrus fruit juices and citrus fruits, such as oranges, lemons, and limes. ?Tomato-based foods, such as red sauce, chili, salsa, and pizza with red sauce. ?Fried and fatty foods, such as donuts, french fries, potato chips, and high-fat dressings. ?  High-fat meats, such as hot dogs and fatty cuts of red and white meats, such as rib eye steak, sausage, ham, and  bacon. ?High-fat dairy items, such as whole milk, butter, and cream cheese. ?Eat small, frequent meals instead of large meals. ?Avoid drinking large amounts of liquid with your meals. ?Avoid eating meals during the 2-3 hours before bedtime. ?Avoid lying down right after you eat. ?Do not exercise right after you eat. ?Lifestyle ? ?Do not use any products that contain nicotine or tobacco. These products include cigarettes, chewing tobacco, and vaping devices, such as e-cigarettes. If you need help quitting, ask your health care provider. ?Try to reduce your stress by using methods such as yoga or meditation. If you need help reducing stress, ask your health care provider. ?If you are overweight, reduce your weight to an amount that is healthy for you. Ask your health care provider for guidance about a safe weight loss goal. ?General instructions ?Pay attention to any changes in your symptoms. ?Take over-the-counter and prescription medicines only as told by your health care provider. Do not take aspirin, ibuprofen, or other NSAIDs unless your health care provider told you to take these medicines. ?Wear loose-fitting clothing. Do not wear anything tight around your waist that causes pressure on your abdomen. ?Raise (elevate) the head of your bed about 6 inches (15 cm). You can use a wedge to do this. ?Avoid bending over if this makes your symptoms worse. ?Keep all follow-up visits. This is important. ?Contact a health care provider if: ?You have: ?New symptoms. ?Unexplained weight loss. ?Difficulty swallowing or it hurts to swallow. ?Wheezing or a persistent cough. ?A hoarse voice. ?Your symptoms do not improve with treatment. ?Get help right away if: ?You have sudden pain in your arms, neck, jaw, teeth, or back. ?You suddenly feel sweaty, dizzy, or light-headed. ?You have chest pain or shortness of breath. ?You vomit and the vomit is green, yellow, or black, or it looks like blood or coffee grounds. ?You faint. ?You  have stool that is red, bloody, or black. ?You cannot swallow, drink, or eat. ?These symptoms may represent a serious problem that is an emergency. Do not wait to see if the symptoms will go away. Get medical help right away. Call your local emergency services (911 in the [place] Do not drive yourself to the hospital. ?Summary ?Gastroesophageal reflux happens when acid from the stomach flows up into the esophagus. GERD is a disease in which the reflux happens often, causes frequent or severe symptoms, or causes problems such as damage to the esophagus. ?Treatment for this condition may vary depending on how severe your symptoms are. Your health care provider may recommend diet and lifestyle changes, medicine, or surgery. ?Contact a health care provider if you have new or worsening symptoms. ?Take over-the-counter and prescription medicines only as told by your health care provider. Do not take aspirin, ibuprofen, or other NSAIDs unless your health care provider told you to do so. ?Keep all follow-up visits as told by your health care provider. This is important. ?This information is not intended to replace advice given to you by your health care provider. Make sure you discuss any questions you have with your health care provider. ?Document Revised: 07/29/2019 Document Reviewed: 07/29/2019 ?Elsevier Patient Education ? 2022 [employer] ? ?

## 2020-09-30 NOTE — Telephone Encounter (Signed)
Called to follow up with client after [facility]. She reports it "was good they were very helpful".  Client has had her labs drawn and is aware of her follow up visit. Today client reports they cleaned out her ear and it feels better. Her area of concern on her right buttock has opened and drained on it's own and she states is healing and just a "little sore"   GERD: provider called in her pantoprazole to [facility] and she was inquiring if it would be free or a small copayment with [facility]. Explained that Her [facility] has not had time for approval yet( was just sent in Monday 09/28/20 for review) Explained that this medication is not on the [facility] formulary to get for free in the mail.  This CM called [facility] and medication priced at 25$ for 90day supply or about 15.34$ per 30 day supply.  Also priced [employer] and a 30 day supply there is 14.11$  Client notified that [facility] could have the medication transferred to [place] for a one time only (one month supply) assistance. Client reports that she will be able to pick up her medication at [facility] tomorrow, she will either get the 90 days or ask for the 30 day supply.  Encouraged client to call if she has any other questions or concerns.  Discussed Mental Health services with client , she reports it has been at least 8 months since she was seen at [facility] and she has not taken any of her mental health medications since that times,she did not like how they made her feel. She is reporting increased times of crying and depression , no thoughts of suicide. Discussed that she should call, but given the length of time since her last visit, she may be required to do another walk in intake to re-establish care. She states she plans on contacting them.  Francee Nodal RN [employer]

## 2020-10-01 ENCOUNTER — Other Ambulatory Visit: Payer: Self-pay

## 2020-10-01 DIAGNOSIS — Z1231 Encounter for screening mammogram for malignant neoplasm of breast: Secondary | ICD-10-CM

## 2020-10-19 ENCOUNTER — Other Ambulatory Visit: Payer: Self-pay

## 2020-10-19 ENCOUNTER — Ambulatory Visit (HOSPITAL_COMMUNITY)
Admission: RE | Admit: 2020-10-19 | Discharge: 2020-10-19 | Disposition: A | Discharge status: Home / Self Care | Payer: Self-pay | Source: Ambulatory Visit | Attending: Physician Assistant | Admitting: Physician Assistant

## 2020-10-19 DIAGNOSIS — Z1231 Encounter for screening mammogram for malignant neoplasm of breast: Secondary | ICD-10-CM | POA: Insufficient documentation

## 2020-10-22 ENCOUNTER — Encounter: Payer: Self-pay | Admitting: Physician Assistant

## 2020-10-22 ENCOUNTER — Ambulatory Visit: Payer: Self-pay | Admitting: Physician Assistant

## 2020-10-22 DIAGNOSIS — Z2821 Immunization not carried out because of patient refusal: Secondary | ICD-10-CM

## 2020-10-22 DIAGNOSIS — K219 Gastro-esophageal reflux disease without esophagitis: Secondary | ICD-10-CM

## 2020-10-22 DIAGNOSIS — E039 Hypothyroidism, unspecified: Secondary | ICD-10-CM

## 2020-10-22 DIAGNOSIS — G8929 Other chronic pain: Secondary | ICD-10-CM

## 2020-10-22 DIAGNOSIS — F17200 Nicotine dependence, unspecified, uncomplicated: Secondary | ICD-10-CM

## 2020-10-22 MED ORDER — LEVOTHYROXINE SODIUM 25 MCG PO TABS
25.0000 ug | ORAL_TABLET | Freq: Every day | ORAL | 1 refills | Status: DC
Start: 2020-10-22 — End: 2021-06-10

## 2020-10-22 NOTE — Progress Notes (Signed)
There were no vitals taken for this visit.   Subjective:    Patient ID: Alexandra Floyd, female    DOB: Dec 29, 1965, 55 y.o.   MRN: [id number]  HPI: Alliah Boulanger is a 55 y.o. female presenting on 10/22/2020 for No chief complaint on file.   HPI   This is a telemedicine appointment through [employer]  I connected with  Alexandra Floyd on 10/22/20 by a video enabled telemedicine application and verified that I am speaking with the correct person using two identifiers.   I discussed the limitations of evaluation and management by telemedicine. The patient expressed understanding and agreed to proceed.  Pt is at home.  Provider is at office.   Pt is 54yoF with appointment to follow up GERD, review labs and discuss her knee pain.  She says her GI symptoms are Doing much better on protonix and she is very please with this.  She says her MH is doing okay.  She has had left knee pain for a long time.  Pt had right knee xray in March that showed extensive OA but pt says her right knee is doing okay at this time.        Relevant past medical, surgical, family and social history reviewed and updated as indicated. Interim medical history since our last visit reviewed. Allergies and medications reviewed and updated.   [facility] Protonix   Review of Systems  Per HPI unless specifically indicated above     Objective:    There were no vitals taken for this visit.  Wt Readings from Last 3 Encounters:  09/30/20 196 lb (88.9 kg)  09/27/20 185 lb 3 oz (84 kg)  05/28/20 185 lb 2 oz (84 kg)    Physical Exam Constitutional:      General: She is not in acute distress.    Appearance: She is not ill-appearing.  HENT:     Head: Normocephalic and atraumatic.  Pulmonary:     Effort: No respiratory distress.     Comments: Pt is talking in complete sentences without dyspnea. Neurological:     Mental Status: She is alert and oriented to person, place, and time.  Psychiatric:        Attention  and Perception: Attention normal.        Speech: Speech normal.        Behavior: Behavior is cooperative.    Results for orders placed or performed during the hospital encounter of 09/30/20  Urinalysis, dipstick only  Result Value Ref Range   Color, Urine STRAW (A) YELLOW   APPearance CLEAR CLEAR   Specific Gravity, Urine 1.004 (L) 1.005 - 1.030   pH 7.0 5.0 - 8.0   Glucose, UA NEGATIVE NEGATIVE mg/dL   Hgb urine dipstick SMALL (A) NEGATIVE   Bilirubin Urine NEGATIVE NEGATIVE   Ketones, ur NEGATIVE NEGATIVE mg/dL   Protein, ur NEGATIVE NEGATIVE mg/dL   Nitrite NEGATIVE NEGATIVE   Leukocytes,Ua NEGATIVE NEGATIVE  Comprehensive metabolic panel  Result Value Ref Range   Sodium 138 135 - 145 mmol/L   Potassium 4.3 3.5 - 5.1 mmol/L   Chloride 103 98 - 111 mmol/L   CO2 25 22 - 32 mmol/L   Glucose, Bld 96 70 - 99 mg/dL   BUN 12 6 - 20 mg/dL   Creatinine, Ser 1.32 0.44 - 1.00 mg/dL   Calcium 9.4 8.9 - 44.0 mg/dL   Total Protein 8.9 (H) 6.5 - 8.1 g/dL   Albumin 4.3 3.5 - 5.0 g/dL   AST  28 15 - 41 U/L   ALT 35 0 - 44 U/L   Alkaline Phosphatase 91 38 - 126 U/L   Total Bilirubin 0.4 0.3 - 1.2 mg/dL   GFR, Estimated >82 >95 mL/min   Anion gap 10 5 - 15  Lipid panel  Result Value Ref Range   Cholesterol 212 (H) 0 - 200 mg/dL   Triglycerides 621 (H) <150 mg/dL   HDL 43 >30 mg/dL   Total CHOL/HDL Ratio 4.9 RATIO   VLDL 52 (H) 0 - 40 mg/dL   LDL Cholesterol 865 (H) 0 - 99 mg/dL  TSH  Result Value Ref Range   TSH 4.933 (H) 0.350 - 4.500 uIU/mL      Assessment & Plan:    Encounter Diagnoses  Name Primary?   Gastroesophageal reflux disease, unspecified whether esophagitis present Yes   Chronic pain of left knee    Hypothyroidism, unspecified type    Influenza vaccination declined    Tobacco use disorder       -reviewed labs with pt -will start Low dose levothyroxine due to pt's reports fatigue -pt is to Continue protonix -Mammogram was done on 9/19 but it has not been  read yet.  Will check on this next week to make sure it is read -Pt reminded to return FIT test for colon cancer screening -pt Declined flu shot appointment -will get Xray left knee.  May refer to orthopedics after reviewe of knee xray -Pt to call [facility] to complete application for [facility] [employer] financial assistance (to cover xray) -pt to follow up 3 months.  She is to contact office sooner prn

## 2020-10-28 ENCOUNTER — Encounter (HOSPITAL_COMMUNITY): Payer: Self-pay

## 2020-10-28 ENCOUNTER — Emergency Department (HOSPITAL_COMMUNITY)
Admission: EM | Admit: 2020-10-28 | Discharge: 2020-10-28 | Disposition: A | Discharge status: Home / Self Care | Payer: Self-pay | Attending: Emergency Medicine | Admitting: Emergency Medicine

## 2020-10-28 ENCOUNTER — Other Ambulatory Visit: Payer: Self-pay

## 2020-10-28 ENCOUNTER — Emergency Department (HOSPITAL_COMMUNITY): Payer: Self-pay

## 2020-10-28 DIAGNOSIS — F17210 Nicotine dependence, cigarettes, uncomplicated: Secondary | ICD-10-CM | POA: Insufficient documentation

## 2020-10-28 DIAGNOSIS — Z28310 Unvaccinated for covid-19: Secondary | ICD-10-CM | POA: Insufficient documentation

## 2020-10-28 DIAGNOSIS — R197 Diarrhea, unspecified: Secondary | ICD-10-CM | POA: Insufficient documentation

## 2020-10-28 DIAGNOSIS — R1030 Lower abdominal pain, unspecified: Secondary | ICD-10-CM | POA: Insufficient documentation

## 2020-10-28 LAB — COMPREHENSIVE METABOLIC PANEL
ALT: 37 U/L (ref 0–44)
AST: 26 U/L (ref 15–41)
Albumin: 4.4 g/dL (ref 3.5–5.0)
Alkaline Phosphatase: 97 U/L (ref 38–126)
Anion gap: 9 (ref 5–15)
BUN: 15 mg/dL (ref 6–20)
CO2: 22 mmol/L (ref 22–32)
Calcium: 9.7 mg/dL (ref 8.9–10.3)
Chloride: 106 mmol/L (ref 98–111)
Creatinine, Ser: 0.77 mg/dL (ref 0.44–1.00)
GFR, Estimated: >60 mL/min (ref >60)
Glucose, Bld: 83 mg/dL (ref 70–99)
Potassium: 4.1 mmol/L (ref 3.5–5.1)
Sodium: 137 mmol/L (ref 135–145)
Total Bilirubin: 0.3 mg/dL (ref 0.3–1.2)
Total Protein: 8.7 g/dL — ABNORMAL HIGH (ref 6.5–8.1)

## 2020-10-28 LAB — URINALYSIS, MICROSCOPIC (REFLEX): Bacteria, UA: NONE SEEN

## 2020-10-28 LAB — CBC WITH DIFFERENTIAL/PLATELET
Abs Immature Granulocytes: 0.01 10*3/uL (ref 0.00–0.07)
Basophils Absolute: 0 10*3/uL (ref 0.0–0.1)
Basophils Relative: 1 %
Eosinophils Absolute: 0.2 10*3/uL (ref 0.0–0.5)
Eosinophils Relative: 4 %
HCT: 38.9 % (ref 36.0–46.0)
Hemoglobin: 13.4 g/dL (ref 12.0–15.0)
Immature Granulocytes: 0 %
Lymphocytes Relative: 32 %
Lymphs Abs: 1.6 10*3/uL (ref 0.7–4.0)
MCH: 32.8 pg (ref 26.0–34.0)
MCHC: 34.4 g/dL (ref 30.0–36.0)
MCV: 95.3 fL (ref 80.0–100.0)
Monocytes Absolute: 0.5 10*3/uL (ref 0.1–1.0)
Monocytes Relative: 10 %
Neutro Abs: 2.7 10*3/uL (ref 1.7–7.7)
Neutrophils Relative %: 53 %
Platelets: 298 10*3/uL (ref 150–400)
RBC: 4.08 MIL/uL (ref 3.87–5.11)
RDW: 12.7 % (ref 11.5–15.5)
WBC: 5.1 10*3/uL (ref 4.0–10.5)
nRBC: 0 % (ref 0.0–0.2)

## 2020-10-28 LAB — C DIFFICILE QUICK SCREEN W PCR REFLEX
C Diff antigen: NEGATIVE
C Diff interpretation: NOT DETECTED
C Diff toxin: NEGATIVE

## 2020-10-28 LAB — URINALYSIS, ROUTINE W REFLEX MICROSCOPIC
Bilirubin Urine: NEGATIVE
Glucose, UA: NEGATIVE mg/dL
Ketones, ur: NEGATIVE mg/dL
Leukocytes,Ua: NEGATIVE
Nitrite: NEGATIVE
Protein, ur: NEGATIVE mg/dL
Specific Gravity, Urine: 1.01 (ref 1.005–1.030)
pH: 5.5 (ref 5.0–8.0)

## 2020-10-28 LAB — MAGNESIUM: Magnesium: 2.1 mg/dL (ref 1.7–2.4)

## 2020-10-28 LAB — LIPASE, BLOOD: Lipase: 31 U/L (ref 11–51)

## 2020-10-28 MED ORDER — LACTATED RINGERS IV BOLUS
1000.0000 mL | Freq: Once | INTRAVENOUS | Status: AC
  Administered 2020-10-28: 1000 mL via INTRAVENOUS

## 2020-10-28 MED ORDER — IOHEXOL 300 MG/ML  SOLN
100.0000 mL | Freq: Once | INTRAMUSCULAR | Status: AC | PRN
  Administered 2020-10-28: 100 mL via INTRAVENOUS

## 2020-10-28 MED ORDER — SULFAMETHOXAZOLE-TRIMETHOPRIM 800-160 MG PO TABS: 1.0000 | ORAL_TABLET | Freq: Two times a day (BID) | ORAL | 0 refills | Status: AC

## 2020-10-28 NOTE — ED Provider Notes (Signed)
Recent [facility] EMERGENCY DEPARTMENT Provider Note   CSN: [id number] Arrival date & time: 10/28/20  1013     History Chief Complaint  Patient presents with   Diarrhea    Alexandra Floyd is a 55 y.o. female who presents with concern for profuse watery diarrhea x10 days.  States that she is having greater than 10 episodes of diarrhea daily but denies any melena or hematochezia.  Does endorse soreness in her anal area.  Endorses severe abdominal cramping both prior to and following bowel movements.  Denies any recent travel outside of the country or any known sick exposures.  She is not vaccinated for COVID-19.  Treating her symptoms with OTC antidiarrheal medication.  Also requesting testing for HIV and syphilis given recent information former sexual partner is HIV positive.  Patient is currently in a monogamous relationship with her boyfriend.  I have reviewed this patient's medical records.  She has history of polysubstance abuse, chronic hepatitis C without treatment, MRSA infection, anxiety and depression.  She is on Protonix daily.  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     History reviewed. No pertinent family  history.  Social History   Tobacco Use   Smoking status: Every Day    Packs/day: 0.50    Types: Cigarettes   Smokeless tobacco: Never  Vaping Use   Vaping Use: Never used  Substance Use Topics   Alcohol use: Yes    Alcohol/week: 12.0 standard drinks    Types: 12 Cans of beer per week    Comment: heavy drinker   Drug use: Not Currently    Types: "Crack" cocaine, Methamphetamines, Heroin    Comment: last use 09/09/20    Home Medications Prior to Admission medications   Medication Sig Start Date End Date Taking? Authorizing Provider  pantoprazole (PROTONIX) 40 MG tablet Take 1 tablet (40 mg total) by mouth daily. 09/30/20  Yes Jacquelin Hawking, PA-C  sulfamethoxazole-trimethoprim (BACTRIM DS) 800-160 MG tablet Take 1 tablet by mouth 2 (two) times daily for 5 days. 10/28/20 11/02/20 Yes Sharrie Self, Lupe Carney R, PA-C  amantadine (SYMMETREL) 100 MG capsule Take 1 capsule (100 mg total) by mouth 2 (two) times daily. For cocaine cravings Patient not taking: No sig reported 09/25/19   Armandina Stammer I, NP  DULoxetine (CYMBALTA) 30 MG capsule Take 1 capsule (30 mg total) by mouth daily. For depression Patient not taking: No sig reported 09/25/19   Armandina Stammer I, NP  levothyroxine (SYNTHROID) 25 MCG tablet Take 1 tablet (25 mcg total) by mouth daily. Patient not taking: No sig reported 10/22/20   Jacquelin Hawking, PA-C  naproxen (NAPROSYN) 500  MG tablet Take 1 tablet (500 mg total) by mouth 2 (two) times daily. Patient not taking: No sig reported 04/08/20   Dartha Lodge, PA-C  nicotine (NICODERM CQ - DOSED IN MG/24 HOURS) 21 mg/24hr patch Place 1 patch (21 mg total) onto the skin daily. (May buy from over the counter): For smoking cessation Patient not taking: No sig reported 09/25/19   Armandina Stammer I, NP  ondansetron (ZOFRAN) 4 MG tablet Take 1 tablet (4 mg total) by mouth every 8 (eight) hours as needed for nausea or vomiting. Patient not taking: No sig reported 02/09/20   Horton, Clabe Seal, DO   traZODone (DESYREL) 100 MG tablet Take 1 tablet (100 mg total) by mouth at bedtime as needed for sleep. Patient not taking: No sig reported 09/25/19   Armandina Stammer I, NP    Allergies    Lasix [furosemide]  Review of Systems   Review of Systems  Constitutional:  Positive for appetite change, chills and fatigue. Negative for fever.  HENT: Negative.    Eyes: Negative.   Respiratory: Negative.    Cardiovascular: Negative.   Gastrointestinal:  Positive for abdominal pain, diarrhea and nausea. Negative for abdominal distention, anal bleeding, blood in stool, constipation, rectal pain and vomiting.  Genitourinary: Negative.   Musculoskeletal: Negative.   Skin: Negative.   Neurological: Negative.    Physical Exam Updated Vital Signs BP (!) 157/101 (BP Location: Right Arm)   Pulse 79   Temp 97.6 F (36.4 C) (Oral)   Resp 16   Ht 5\' 3"  (1.6 m)   Wt 74.8 kg   SpO2 99%   BMI 29.23 kg/m   Physical Exam Vitals and nursing note reviewed.  Constitutional:      General: She is not in acute distress.    Appearance: She is overweight. She is not ill-appearing or toxic-appearing.  HENT:     Head: Normocephalic and atraumatic.     Nose: Nose normal. No congestion.     Mouth/Throat:     Mouth: Mucous membranes are moist.     Pharynx: Oropharynx is clear. Uvula midline. No oropharyngeal exudate or posterior oropharyngeal erythema.     Tonsils: No tonsillar exudate.  Eyes:     General: Lids are normal. Vision grossly intact.        Right eye: No discharge.        Left eye: No discharge.     Extraocular Movements: Extraocular movements intact.     Conjunctiva/sclera: Conjunctivae normal.     Pupils: Pupils are equal, round, and reactive to light.  Neck:     Trachea: Trachea and phonation normal.  Cardiovascular:     Rate and Rhythm: Normal rate and regular rhythm.     Pulses: Normal pulses.     Heart sounds: Normal heart sounds. No murmur heard. Pulmonary:     Effort: Pulmonary  effort is normal. No tachypnea, bradypnea, accessory muscle usage or respiratory distress.     Breath sounds: Normal breath sounds. No wheezing or rales.  Chest:     Chest wall: No mass, lacerations, deformity, swelling, tenderness, crepitus or edema.  Abdominal:     General: Bowel sounds are increased. There is no distension.     Palpations: Abdomen is soft.     Tenderness: There is no abdominal tenderness. There is no right CVA tenderness, left CVA tenderness, guarding or rebound.  Musculoskeletal:        General: No deformity.     Cervical back: Neck supple. No  edema, rigidity or crepitus. No pain with movement or spinous process tenderness.     Right lower leg: No edema.     Left lower leg: No edema.  Lymphadenopathy:     Cervical: No cervical adenopathy.  Skin:    General: Skin is warm and dry.     Capillary Refill: Capillary refill takes less than 2 seconds.  Neurological:     General: No focal deficit present.     Mental Status: She is alert and oriented to person, place, and time. Mental status is at baseline.     Sensory: Sensation is intact.     Motor: Motor function is intact.     Gait: Gait is intact.  Psychiatric:        Mood and Affect: Mood normal.    ED Results / Procedures / Treatments   Labs (all labs ordered are listed, but only abnormal results are displayed) Labs Reviewed  COMPREHENSIVE METABOLIC PANEL - Abnormal; Notable for the following components:      Result Value   Total Protein 8.7 (*)    All other components within normal limits  URINALYSIS, ROUTINE W REFLEX MICROSCOPIC - Abnormal; Notable for the following components:   Hgb urine dipstick TRACE (*)    All other components within normal limits  GASTROINTESTINAL PANEL BY PCR, STOOL (REPLACES STOOL CULTURE)  C DIFFICILE QUICK SCREEN W PCR REFLEX    LIPASE, BLOOD  HIV ANTIBODY (ROUTINE TESTING W REFLEX)  RPR  MAGNESIUM  CBC WITH DIFFERENTIAL/PLATELET  URINALYSIS, MICROSCOPIC (REFLEX)     EKG None  Radiology CT Abdomen Pelvis W Contrast  Result Date: 10/28/2020 CLINICAL DATA:  Abdominal pain, acute, nonlocalized Generalized pain in the lower quadrants, Diarrhea x 10 days EXAM: CT ABDOMEN AND PELVIS WITH CONTRAST TECHNIQUE: Multidetector CT imaging of the abdomen and pelvis was performed using the standard protocol following bolus administration of intravenous contrast. CONTRAST:  OMNIPAQUE IOHEXOL 300 MG/ML  SOLN COMPARISON:  None. FINDINGS: Inferior chest: The lung bases are well-aerated. Hepatobiliary: The liver is normal in size without focal abnormality. No intrahepatic or extrahepatic biliary ductal dilation. The gallbladder is surgically absent. Spleen: Normal in size without focal abnormality. Pancreas: No pancreatic ductal dilatation or surrounding inflammatory changes. Adrenals/Urinary Tract: Adrenal glands are unremarkable. Kidneys are normal, without renal calculi, focal lesion, or hydronephrosis. Bladder is unremarkable. Stomach/Bowel: The stomach, small bowel and large bowel are normal in caliber without abnormal wall thickening or surrounding inflammatory changes. The appendix is normal. Small hiatal hernia. Sigmoid diverticulosis without active inflammatory changes. Reproductive: Uterus and bilateral adnexa are unremarkable. Lymphatic: No enlarged lymph nodes in the abdomen or pelvis. Vasculature: The abdominal aorta is normal in caliber. The portal venous system is patent. Other: No abdominopelvic ascites. Musculoskeletal: No aggressive osseous lesions. The soft tissues are unremarkable. IMPRESSION: 1.  No acute abnormality in the abdomen or pelvis. 2.  Sigmoid diverticulosis without active inflammatory changes. Electronically Signed   By: Olive Bass M.D.   On: 10/28/2020 15:48    Procedures Procedures   Medications Ordered in ED Medications  lactated ringers bolus 1,000 mL (0 mLs Intravenous Stopped 10/28/20 1410)  iohexol (OMNIPAQUE) 300 MG/ML solution  100 mL (100 mLs Intravenous Contrast Given 10/28/20 1434)    ED Course  I have reviewed the triage vital signs and the nursing notes.  Pertinent labs & imaging results that were available during my care of the patient were reviewed by me and considered in my medical decision making (see chart for details).  MDM Rules/Calculators/A&P                         55 year old female presents with concern for profuse watery diarrhea x10 days.  The differential diagnosis of diarrhea includes but is not limited to: Viral: norovirus/rotavirus; Bacterial-Campylobacter,Shigella, Salmonella, Escherichia coli, E. coli 0157:H7, Yersinia enterocolitica, Vibrio cholerae, Clostridium difficile. Parasitic- Giardia lamblia, Cryptosporidium,Entamoeba histolytica,Cyclospora, Microsporidium. Toxin- Staphylococcus aureus, Bacillus cereus.  Noninfectious causes include GI Bleed, Appendicitis, Mesenteric Ischemia, Diverticulitis, Adrenal Crisis, Thyroid Storm, Toxicologic exposures, Antibiotic or drug-associated, inflammatory bowel disease.  Hypertensive on intake but vital signs were otherwise normal.  Cardiopulmonary exam is normal, abdominal exam is significant only for increased bowel sounds.  No CVA tenderness or notable tenderness to palpation on exam.  Patient is neurovascular intact in all 4 extremities and well-appearing.  Unfortunately there were extensive delays in obtaining IV access on this patient, however laboratory studies were reassuring.  CBC unremarkable, CMP unremarkable.  Magnesium is normal.  UA without signs of infection.  CDX Panel negative, stool culture pending at this time.  CT abdomen pelvis negative for acute abnormality in the abdomen or pelvis.  Patient has received fluid bolus in endorses that she is feeling much improved.  While the exact etiology of her symptoms remains unclear, there is not appear to be any emergent issue at this time.  Recommend close outpatient gastroenterology  follow-up and strict return precautions were given.  Relena voiced understanding of her medical evaluation and treatment plan.  Each of her questions was answered to her expressed satisfaction.  Return precautions were given.  Patient is well-appearing, stable, and appropriate for discharge at this time.  This chart was dictated using voice recognition software, Dragon. Despite the best efforts of this provider to proofread and correct errors, errors may still occur which can change documentation meaning.   Final Clinical Impression(s) / ED Diagnoses Final diagnoses:  Diarrhea of presumed infectious origin    Rx / DC Orders ED Discharge Orders          Ordered    sulfamethoxazole-trimethoprim (BACTRIM DS) 800-160 MG tablet  2 times daily        10/28/20 1612             Ricardo Kayes, Eugene Gavia, PA-C 10/30/20 1359    Terrilee Files, MD 10/31/20 [phone]

## 2020-10-28 NOTE — ED Notes (Signed)
Patient transported to CT 

## 2020-10-28 NOTE — Discharge Instructions (Addendum)
You were seen for your diarrhea.   Your blood work and CT scan were reassuring. Given your abdominal pain you were treated with antibiotics called Bactrim which is to take twice a day for the next 5 days home as prescribed.  You may additionally try the BRAT diet which is bananas, rice, applesauce, and toast, and recommend you avoid caffeine and raw fruit You may also start an over the counter probiotic.  Please continue drink lots of fluids including fluids with electrolytes in them such as Gatorade or other electrolyte drinks.  Additionally may continue to use over-the-counter antidiarrheal medications.  Return to the ER with any persistent symptoms, worsening abdominal pain, nausea or vomiting does not stop, or any other new severe symptom. You may also follow up with the gastroenterologist listed below.

## 2020-10-28 NOTE — ED Triage Notes (Addendum)
Pt complains of diarrhea x10 days. Pt states she was bitten by unknown insect 2 weeks ago and she has had increased fatigue since.

## 2020-10-28 NOTE — ED Notes (Signed)
Pt returned from CT °

## 2020-10-29 ENCOUNTER — Ambulatory Visit: Payer: Self-pay | Admitting: Physician Assistant

## 2020-10-29 LAB — GASTROINTESTINAL PANEL BY PCR, STOOL (REPLACES STOOL CULTURE)

## 2020-10-29 LAB — RPR: RPR Ser Ql: NONREACTIVE

## 2020-10-29 LAB — HIV ANTIBODY (ROUTINE TESTING W REFLEX): HIV Screen 4th Generation wRfx: NONREACTIVE

## 2020-11-05 ENCOUNTER — Telehealth: Payer: Self-pay

## 2020-11-05 NOTE — Telephone Encounter (Signed)
Call from pt who states she is still not feeling well from hospital visit, pt is now requesting a refill of at least 3 days of Bactrim.

## 2020-11-05 NOTE — Telephone Encounter (Signed)
Returned call to pt, left vm to call back.

## 2020-11-05 NOTE — Telephone Encounter (Signed)
Spoke with pt & informed antibiotics are not indicated based on results from most recent labs, pt then stated she thinks she is having a "MRSA flair up" & is having issues with swelling and fever, informed per PCP to go to ER for further evaluation.

## 2021-01-27 ENCOUNTER — Other Ambulatory Visit: Payer: Self-pay | Admitting: Physician Assistant

## 2021-01-27 DIAGNOSIS — R7989 Other specified abnormal findings of blood chemistry: Secondary | ICD-10-CM

## 2021-01-27 DIAGNOSIS — E039 Hypothyroidism, unspecified: Secondary | ICD-10-CM

## 2021-02-10 ENCOUNTER — Ambulatory Visit: Payer: Self-pay | Admitting: Physician Assistant

## 2021-04-26 ENCOUNTER — Other Ambulatory Visit: Payer: Self-pay

## 2021-04-26 ENCOUNTER — Emergency Department (HOSPITAL_COMMUNITY): Payer: Self-pay

## 2021-04-26 ENCOUNTER — Encounter (HOSPITAL_COMMUNITY): Payer: Self-pay | Admitting: Emergency Medicine

## 2021-04-26 ENCOUNTER — Emergency Department (HOSPITAL_COMMUNITY)
Admission: EM | Admit: 2021-04-26 | Discharge: 2021-04-26 | Discharge status: Against Medical Advice | Payer: Self-pay | Attending: Emergency Medicine | Admitting: Emergency Medicine

## 2021-04-26 DIAGNOSIS — F1110 Opioid abuse, uncomplicated: Secondary | ICD-10-CM | POA: Insufficient documentation

## 2021-04-26 DIAGNOSIS — Z5321 Procedure and treatment not carried out due to patient leaving prior to being seen by health care provider: Secondary | ICD-10-CM | POA: Insufficient documentation

## 2021-04-26 DIAGNOSIS — R0789 Other chest pain: Secondary | ICD-10-CM | POA: Insufficient documentation

## 2021-04-26 LAB — BASIC METABOLIC PANEL
Anion gap: 6 (ref 5–15)
BUN: 15 mg/dL (ref 6–20)
CO2: 29 mmol/L (ref 22–32)
Calcium: 8.8 mg/dL — ABNORMAL LOW (ref 8.9–10.3)
Chloride: 103 mmol/L (ref 98–111)
Creatinine, Ser: 0.83 mg/dL (ref 0.44–1.00)
GFR, Estimated: >60 mL/min (ref >60)
Glucose, Bld: 85 mg/dL (ref 70–99)
Potassium: 4.1 mmol/L (ref 3.5–5.1)
Sodium: 138 mmol/L (ref 135–145)

## 2021-04-26 LAB — CBC
HCT: 34.8 % — ABNORMAL LOW (ref 36.0–46.0)
Hemoglobin: 11.4 g/dL — ABNORMAL LOW (ref 12.0–15.0)
MCH: 31.1 pg (ref 26.0–34.0)
MCHC: 32.8 g/dL (ref 30.0–36.0)
MCV: 95.1 fL (ref 80.0–100.0)
Platelets: 299 10*3/uL (ref 150–400)
RBC: 3.66 MIL/uL — ABNORMAL LOW (ref 3.87–5.11)
RDW: 13.1 % (ref 11.5–15.5)
WBC: 5.1 10*3/uL (ref 4.0–10.5)
nRBC: 0 % (ref 0.0–0.2)

## 2021-04-26 LAB — TROPONIN I (HIGH SENSITIVITY)
Troponin I (High Sensitivity): 4 ng/L (ref <18)
Troponin I (High Sensitivity): 3 ng/L

## 2021-04-26 NOTE — ED Triage Notes (Signed)
Pt c/o chest pain after using Heroine x 3 days ago; pt reports she received chest compressions and received narcan; reports chest pain since; pt further reports bilateral leg swelling  ?

## 2021-05-22 ENCOUNTER — Emergency Department (HOSPITAL_COMMUNITY)
Admission: EM | Admit: 2021-05-22 | Discharge: 2021-05-22 | Disposition: A | Discharge status: Home / Self Care | Payer: Self-pay | Attending: Emergency Medicine | Admitting: Emergency Medicine

## 2021-05-22 ENCOUNTER — Other Ambulatory Visit: Payer: Self-pay

## 2021-05-22 ENCOUNTER — Encounter (HOSPITAL_COMMUNITY): Payer: Self-pay

## 2021-05-22 DIAGNOSIS — M79672 Pain in left foot: Secondary | ICD-10-CM | POA: Insufficient documentation

## 2021-05-22 DIAGNOSIS — Z5321 Procedure and treatment not carried out due to patient leaving prior to being seen by health care provider: Secondary | ICD-10-CM | POA: Insufficient documentation

## 2021-05-22 NOTE — ED Triage Notes (Addendum)
Pt presents with toe pain on the left foot. Pt says she broke it years ago and never got it fixed. States its started hurting and getting red a couple days ago.  ?  ?Redness and swelling noted.  ?Pt appears to be under the influence  ? ? ?

## 2021-05-22 NOTE — ED Notes (Signed)
This RN went to update vital signs on pt. Pt not in room. Called registration and confirmed pt did leave department without notifying staff.  ?

## 2021-05-24 ENCOUNTER — Telehealth: Payer: Self-pay

## 2021-05-24 NOTE — Telephone Encounter (Signed)
[facility] client with recent visit to [facility] ER on 05/22/21.  ?Attempted follow up call. No answer and unable to leave a voicemail today. ? ?Francee Nodal RN ?[employer] ?

## 2021-05-27 ENCOUNTER — Ambulatory Visit: Payer: Self-pay | Admitting: Physician Assistant

## 2021-06-07 ENCOUNTER — Emergency Department (HOSPITAL_COMMUNITY)
Admission: EM | Admit: 2021-06-07 | Discharge: 2021-06-07 | Discharge status: Against Medical Advice | Payer: Self-pay | Source: Home / Self Care

## 2021-06-09 ENCOUNTER — Emergency Department (HOSPITAL_COMMUNITY)
Admission: EM | Admit: 2021-06-09 | Discharge: 2021-06-10 | Disposition: A | Discharge status: Home / Self Care | Payer: Self-pay | Attending: Emergency Medicine | Admitting: Emergency Medicine

## 2021-06-09 ENCOUNTER — Other Ambulatory Visit: Payer: Self-pay

## 2021-06-09 DIAGNOSIS — F1410 Cocaine abuse, uncomplicated: Secondary | ICD-10-CM | POA: Insufficient documentation

## 2021-06-09 DIAGNOSIS — F1910 Other psychoactive substance abuse, uncomplicated: Secondary | ICD-10-CM

## 2021-06-09 LAB — RAPID URINE DRUG SCREEN, HOSP PERFORMED
Amphetamines: NOT DETECTED
Barbiturates: NOT DETECTED
Benzodiazepines: NOT DETECTED
Cocaine: POSITIVE — AB
Opiates: NOT DETECTED
Tetrahydrocannabinol: NOT DETECTED

## 2021-06-09 NOTE — ED Provider Triage Note (Signed)
Emergency Medicine Provider Triage Evaluation Note ? ?Alexandra Floyd , a 56 y.o. female  was evaluated in triage.  Pt complains of polysubstance abuse.  She endorses cocaine use, and alcohol use.  She states she wants to get clean.  Last use few hours ago for both cocaine and alcohol.  She denies pain anywhere.  Denies SI HI, or AVH. ? ?Review of Systems  ?Positive: As above ?Negative: As above ? ?Physical Exam  ?There were no vitals taken for this visit. ?Gen:   Awake, no distress ?Resp:  Normal effort  ?MSK:   Moves extremities without difficulty  ?Other:   ? ?Medical Decision Making  ?Medically screening exam initiated at 7:02 PM.  Appropriate orders placed.  Redonda Wolter was informed that the remainder of the evaluation will be completed by another provider, this initial triage assessment does not replace that evaluation, and the importance of remaining in the ED until their evaluation is complete. ? ? ?  ?Evlyn Courier, PA-C ?06/09/21 1903 ? ?

## 2021-06-09 NOTE — ED Triage Notes (Addendum)
Pt BIB EMS from home.  Called out for request to go to rehab.  Pt endorses snorting cocaine today and using alcohol.  States she used to inject heroin and meth but had been clean for 7 years in the past.  Now states she does not inject drugs but does snort them.  No SI/HI ?

## 2021-06-10 ENCOUNTER — Encounter (HOSPITAL_COMMUNITY): Payer: Self-pay | Admitting: Psychiatry

## 2021-06-10 ENCOUNTER — Other Ambulatory Visit (HOSPITAL_COMMUNITY)
Admission: EM | Admit: 2021-06-10 | Discharge: 2021-06-14 | Disposition: A | Discharge status: Home / Self Care | Payer: No Payment, Other | Attending: Psychiatry | Admitting: Psychiatry

## 2021-06-10 DIAGNOSIS — F1910 Other psychoactive substance abuse, uncomplicated: Secondary | ICD-10-CM | POA: Diagnosis not present

## 2021-06-10 DIAGNOSIS — F10129 Alcohol abuse with intoxication, unspecified: Secondary | ICD-10-CM | POA: Insufficient documentation

## 2021-06-10 DIAGNOSIS — Z20822 Contact with and (suspected) exposure to covid-19: Secondary | ICD-10-CM | POA: Insufficient documentation

## 2021-06-10 DIAGNOSIS — R4589 Other symptoms and signs involving emotional state: Secondary | ICD-10-CM

## 2021-06-10 DIAGNOSIS — R7989 Other specified abnormal findings of blood chemistry: Secondary | ICD-10-CM

## 2021-06-10 LAB — CBC WITH DIFFERENTIAL/PLATELET
Abs Immature Granulocytes: 0.02 10*3/uL (ref 0.00–0.07)
Basophils Absolute: 0.1 10*3/uL (ref 0.0–0.1)
Basophils Relative: 1 %
Eosinophils Absolute: 0.1 10*3/uL (ref 0.0–0.5)
Eosinophils Relative: 2 %
HCT: 44.2 % (ref 36.0–46.0)
Hemoglobin: 14.9 g/dL (ref 12.0–15.0)
Immature Granulocytes: 0 %
Lymphocytes Relative: 26 %
Lymphs Abs: 1.7 10*3/uL (ref 0.7–4.0)
MCH: 31.1 pg (ref 26.0–34.0)
MCHC: 33.7 g/dL (ref 30.0–36.0)
MCV: 92.3 fL (ref 80.0–100.0)
Monocytes Absolute: 0.6 10*3/uL (ref 0.1–1.0)
Monocytes Relative: 10 %
Neutro Abs: 4 10*3/uL (ref 1.7–7.7)
Neutrophils Relative %: 61 %
Platelets: 325 10*3/uL (ref 150–400)
RBC: 4.79 MIL/uL (ref 3.87–5.11)
RDW: 12.8 % (ref 11.5–15.5)
WBC: 6.4 10*3/uL (ref 4.0–10.5)
nRBC: 0 % (ref 0.0–0.2)

## 2021-06-10 LAB — POCT URINE DRUG SCREEN - MANUAL ENTRY (I-SCREEN)
POC Amphetamine UR: NOT DETECTED
POC Buprenorphine (BUP): NOT DETECTED
POC Cocaine UR: POSITIVE — AB
POC Marijuana UR: NOT DETECTED
POC Methadone UR: NOT DETECTED
POC Methamphetamine UR: NOT DETECTED
POC Morphine: NOT DETECTED
POC Oxazepam (BZO): NOT DETECTED
POC Oxycodone UR: NOT DETECTED
POC Secobarbital (BAR): NOT DETECTED

## 2021-06-10 LAB — LIPID PANEL
Cholesterol: 221 mg/dL — ABNORMAL HIGH (ref 0–200)
HDL: 58 mg/dL (ref >40)
LDL Cholesterol: 135 mg/dL — ABNORMAL HIGH (ref 0–99)
Total CHOL/HDL Ratio: 3.8 RATIO
Triglycerides: 141 mg/dL (ref <150)
VLDL: 28 mg/dL (ref 0–40)

## 2021-06-10 LAB — TSH: TSH: 7.806 u[IU]/mL — ABNORMAL HIGH (ref 0.350–4.500)

## 2021-06-10 LAB — COMPREHENSIVE METABOLIC PANEL
ALT: 34 U/L (ref 0–44)
AST: 32 U/L (ref 15–41)
Albumin: 4.6 g/dL (ref 3.5–5.0)
Alkaline Phosphatase: 113 U/L (ref 38–126)
Anion gap: 8 (ref 5–15)
BUN: 13 mg/dL (ref 6–20)
CO2: 25 mmol/L (ref 22–32)
Calcium: 10.1 mg/dL (ref 8.9–10.3)
Chloride: 103 mmol/L (ref 98–111)
Creatinine, Ser: 0.85 mg/dL (ref 0.44–1.00)
GFR, Estimated: >60 mL/min (ref >60)
Glucose, Bld: 98 mg/dL (ref 70–99)
Potassium: 4.1 mmol/L (ref 3.5–5.1)
Sodium: 136 mmol/L (ref 135–145)
Total Bilirubin: 0.8 mg/dL (ref 0.3–1.2)
Total Protein: 8.9 g/dL — ABNORMAL HIGH (ref 6.5–8.1)

## 2021-06-10 LAB — RESP PANEL BY RT-PCR (FLU A&B, COVID) ARPGX2
Influenza A by PCR: NEGATIVE
Influenza B by PCR: NEGATIVE
SARS Coronavirus 2 by RT PCR: NEGATIVE

## 2021-06-10 LAB — POC SARS CORONAVIRUS 2 AG: SARSCOV2ONAVIRUS 2 AG: NEGATIVE

## 2021-06-10 LAB — HEMOGLOBIN A1C
Hgb A1c MFr Bld: 5.5 % (ref 4.8–5.6)
Mean Plasma Glucose: 111.15 mg/dL

## 2021-06-10 LAB — POCT PREGNANCY, URINE: Preg Test, Ur: NEGATIVE

## 2021-06-10 LAB — T4, FREE: Free T4: 0.76 ng/dL (ref 0.61–1.12)

## 2021-06-10 LAB — MAGNESIUM: Magnesium: 2.3 mg/dL (ref 1.7–2.4)

## 2021-06-10 LAB — ETHANOL: Alcohol, Ethyl (B): <10 mg/dL (ref <10)

## 2021-06-10 MED ORDER — NAPROXEN 500 MG PO TABS
500.0000 mg | ORAL_TABLET | Freq: Two times a day (BID) | ORAL | Status: DC | PRN
  Administered 2021-06-10 – 2021-06-11 (×2): 500 mg via ORAL
  Filled 2021-06-10 (×2): qty 1

## 2021-06-10 MED ORDER — DICYCLOMINE HCL 20 MG PO TABS
20.0000 mg | ORAL_TABLET | Freq: Four times a day (QID) | ORAL | Status: DC | PRN
Start: 2021-06-10 — End: 2021-06-10

## 2021-06-10 MED ORDER — DULOXETINE HCL 20 MG PO CPEP
20.0000 mg | ORAL_CAPSULE | Freq: Every day | ORAL | Status: DC
  Administered 2021-06-10 – 2021-06-13 (×4): 20 mg via ORAL
  Filled 2021-06-10: qty 14
  Filled 2021-06-10 (×3): qty 1
  Filled 2021-06-10: qty 14
  Filled 2021-06-10: qty 1

## 2021-06-10 MED ORDER — THIAMINE HCL 100 MG PO TABS
100.0000 mg | ORAL_TABLET | Freq: Every day | ORAL | Status: DC
  Administered 2021-06-11 – 2021-06-13 (×3): 100 mg via ORAL
  Filled 2021-06-10: qty 1
  Filled 2021-06-10: qty 14
  Filled 2021-06-10 (×2): qty 1

## 2021-06-10 MED ORDER — METHOCARBAMOL 500 MG PO TABS: 500.0000 mg | ORAL_TABLET | Freq: Three times a day (TID) | ORAL | Status: DC | PRN

## 2021-06-10 MED ORDER — LOPERAMIDE HCL 2 MG PO CAPS: 2.0000 mg | ORAL_CAPSULE | ORAL | Status: AC | PRN

## 2021-06-10 MED ORDER — THIAMINE HCL 100 MG/ML IJ SOLN
100.0000 mg | Freq: Once | INTRAMUSCULAR | Status: DC
  Filled 2021-06-10: qty 2

## 2021-06-10 MED ORDER — LORAZEPAM 1 MG PO TABS: 1.0000 mg | ORAL_TABLET | Freq: Three times a day (TID) | ORAL | Status: DC

## 2021-06-10 MED ORDER — ADULT MULTIVITAMIN W/MINERALS CH
1.0000 | ORAL_TABLET | Freq: Every day | ORAL | Status: DC
  Administered 2021-06-10 – 2021-06-13 (×4): 1 via ORAL
  Filled 2021-06-10 (×2): qty 1
  Filled 2021-06-10: qty 14
  Filled 2021-06-10 (×2): qty 1

## 2021-06-10 MED ORDER — LORAZEPAM 1 MG PO TABS
1.0000 mg | ORAL_TABLET | Freq: Four times a day (QID) | ORAL | Status: AC | PRN
  Administered 2021-06-10: 1 mg via ORAL

## 2021-06-10 MED ORDER — MAGNESIUM HYDROXIDE 400 MG/5ML PO SUSP: 30.0000 mL | Freq: Every day | ORAL | Status: DC | PRN

## 2021-06-10 MED ORDER — LORAZEPAM 1 MG PO TABS
1.0000 mg | ORAL_TABLET | Freq: Four times a day (QID) | ORAL | Status: DC
  Filled 2021-06-10: qty 1

## 2021-06-10 MED ORDER — ACETAMINOPHEN 325 MG PO TABS
650.0000 mg | ORAL_TABLET | Freq: Four times a day (QID) | ORAL | Status: DC | PRN
  Administered 2021-06-11 – 2021-06-13 (×2): 650 mg via ORAL
  Filled 2021-06-10 (×2): qty 2

## 2021-06-10 MED ORDER — LORAZEPAM 1 MG PO TABS
1.0000 mg | ORAL_TABLET | Freq: Every day | ORAL | Status: DC
Start: 2021-06-14 — End: 2021-06-10

## 2021-06-10 MED ORDER — LORAZEPAM 1 MG PO TABS: 1.0000 mg | ORAL_TABLET | Freq: Two times a day (BID) | ORAL | Status: DC

## 2021-06-10 MED ORDER — HYDROXYZINE HCL 25 MG PO TABS
25.0000 mg | ORAL_TABLET | Freq: Four times a day (QID) | ORAL | Status: AC | PRN
  Administered 2021-06-10 – 2021-06-11 (×2): 25 mg via ORAL
  Filled 2021-06-10 (×2): qty 1

## 2021-06-10 MED ORDER — ONDANSETRON 4 MG PO TBDP
4.0000 mg | ORAL_TABLET | Freq: Four times a day (QID) | ORAL | Status: AC | PRN
  Administered 2021-06-10: 4 mg via ORAL
  Filled 2021-06-10: qty 1

## 2021-06-10 MED ORDER — LEVOTHYROXINE SODIUM 25 MCG PO TABS
25.0000 ug | ORAL_TABLET | Freq: Every day | ORAL | Status: DC
  Administered 2021-06-10 – 2021-06-14 (×5): 25 ug via ORAL
  Filled 2021-06-10: qty 14
  Filled 2021-06-10: qty 1
  Filled 2021-06-10: qty 14
  Filled 2021-06-10 (×4): qty 1

## 2021-06-10 MED ORDER — ALUM & MAG HYDROXIDE-SIMETH 200-200-20 MG/5ML PO SUSP
30.0000 mL | ORAL | Status: DC | PRN
  Administered 2021-06-13: 30 mL via ORAL
  Filled 2021-06-10: qty 30

## 2021-06-10 NOTE — ED Notes (Signed)
Pending results of 2 HR Covid and transfer to [facility].  Pt A&O x 4, anxious and cooperative.  Monitoring for safety. ?

## 2021-06-10 NOTE — Progress Notes (Signed)
?   06/10/21 0225  ?[facility] Triage Screening (Walk-ins at [facility] only)  ?How Did You Hear About [place]? Self  ?What Is the Reason for Your Visit/Call Today? Alexandra Floyd is a 56 year old female presenting voluntary as a walk-in to [facility] requesting medication management and detox. Patient denied SI, HI and psychosis. Patient states "I need something to calm me down". Patient reported having panic attacks due to her anxieties. Patient reported being homeless since she has broke up with boyfriend a couple days ago and now has no where to stay. Patient admitted to using "a little cocaine and just drinking a little bit", amounts unknown. Patient was seen today over an hour ago at [facility], where she was brought in by EMS requesting rehab for cocaine and alcohol.  ?How Long Has This Been Causing You Problems? 1 wk - 1 month  ?Have You Recently Had Any Thoughts About Hurting Yourself? No  ?Are You Planning to Commit Suicide/Harm Yourself At This time? No  ?Have you Recently Had Thoughts About Hurting Someone [name]? No  ?Are You Planning To Harm Someone At This Time? No  ?Are you currently experiencing any auditory, visual or other hallucinations? No  ?Have You Used Any Alcohol or Drugs in the Past 24 Hours? Yes  ?How long ago did you use Drugs or Alcohol? today  ?What Did You Use and How Much? alcohol and cocaine  ?Do you have any current medical co-morbidities that require immediate attention? No  ?Clinician description of patient physical appearance/behavior: normal / cooperative  ?What Do You Feel Would Help You the Most Today? Treatment for Depression or other mood problem  ?If access to [facility] was not available, would you have sought care in the Emergency Department? Yes  ?Determination of Need Urgent (48 hours)  ?Options For Referral Outpatient Therapy;Medication Management;Facility-Based Crisis  ? ? ?

## 2021-06-10 NOTE — ED Notes (Signed)
Pt ate dinner and some snacks ?

## 2021-06-10 NOTE — ED Notes (Signed)
Pt later attended group with the Chaplain, snacks and coffee given ?

## 2021-06-10 NOTE — ED Notes (Signed)
Pt resting in bed. A&O x4, calm and cooperative. Denies current SI/HI/AVH. Denies any current needs. No signs of acute distress noted. Will continue to monitor for safety.  

## 2021-06-10 NOTE — ED Notes (Signed)
Pt asleep in bed. Respirations even and unlabored. Will continue to monitor for safety. ?

## 2021-06-10 NOTE — ED Provider Notes (Signed)
?[facility] EMERGENCY DEPARTMENT ?Provider Note ? ?CSN: [id number] ?Arrival date & time: 06/09/21 1853 ? ?Chief Complaint(s) ?Drug Problem ? ?HPI ?Alexandra Floyd is a 56 y.o. female with a past medical history listed below including polysubstance abuse here requesting assistance with rehab.  Reports that she used to use IV drugs but no longer uses.  States that she is now snorting cocaine and drinking alcohol.  Reports she last used more than 12 hours ago.  States that she wants to get off of drugs and wants to get back on her psychiatric medication that she was previously on.  She denies any suicidal ideation, homicidal ideation, AVH.  Denies any other physical complaints. ? ?The history is provided by the patient.  ?Past Medical History ?Past Medical History:  ?Diagnosis Date  ? Anxiety   ? Cellulitis   ? Depression   ? Heart murmur   ? Hep C w/ coma, chronic   ? MRSA cellulitis   ? ?Patient Active Problem List  ? Diagnosis Date Noted  ? Substance induced mood disorder (HCC) 09/21/2019  ? Polysubstance abuse (HCC) 09/21/2019  ? 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 Cocaine use disorder, mild, abuse (HCC) 09/21/2019  ? 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 Marinol use disorder, severe, dependence (HCC) 09/21/2019  ? MDD (major depressive disorder), recurrent episode, severe (HCC) 09/20/2019  ? ?Home Medication(s) ?Prior to Admission medications   ?Medication Sig Start Date End Date Taking? Authorizing Provider  ?amantadine (SYMMETREL) 100 MG capsule Take 1 capsule (100 mg total) by mouth 2 (two) times daily. For cocaine cravings ?Patient not taking: No sig reported 09/25/19   Armandina Stammer I, NP  ?DULoxetine (CYMBALTA) 30 MG capsule Take 1 capsule (30 mg total) by mouth daily. For depression ?Patient not taking: No sig reported 09/25/19   Armandina Stammer I, NP  ?levothyroxine (SYNTHROID) 25 MCG  tablet Take 1 tablet (25 mcg total) by mouth daily. ?Patient not taking: No sig reported 10/22/20   Jacquelin Hawking, PA-C  ?naproxen (NAPROSYN) 500 MG tablet Take 1 tablet (500 mg total) by mouth 2 (two) times daily. ?Patient not taking: No sig reported 04/08/20   Dartha Lodge, PA-C  ?nicotine (NICODERM CQ - DOSED IN MG/24 HOURS) 21 mg/24hr patch Place 1 patch (21 mg total) onto the skin daily. (May buy from over the counter): For smoking cessation ?Patient not taking: No sig reported 09/25/19   Armandina Stammer I, NP  ?ondansetron (ZOFRAN) 4 MG tablet Take 1 tablet (4 mg total) by mouth every 8 (eight) hours as needed for nausea or vomiting. ?Patient not taking: No sig reported 02/09/20   Horton, Clabe Seal, DO  ?pantoprazole (PROTONIX) 40 MG tablet Take 1 tablet (40 mg total) by mouth daily. 09/30/20   Jacquelin Hawking, PA-C  ?traZODone (DESYREL) 100 MG tablet Take 1 tablet (100 mg total) by mouth at bedtime as needed for sleep. ?Patient not taking: No sig reported 09/25/19   Sanjuana Kava, NP  ?                                                                                                                                  ?  Allergies ?Lasix [furosemide] ? ?Review of Systems ?Review of Systems ?As noted in HPI ? ?Physical Exam ?Vital Signs  ?I have reviewed the triage vital signs ?BP (!) 124/92   Pulse 81   Temp 97.9 ?F (36.6 ?C) (Oral)   Resp (!) 22   SpO2 98%  ? ?Physical Exam ?Vitals reviewed.  ?Constitutional:   ?   General: She is not in acute distress. ?   Appearance: She is well-developed. She is not diaphoretic.  ?HENT:  ?   Head: Normocephalic and atraumatic.  ?   Nose: Nose normal.  ?Eyes:  ?   General: No scleral icterus.    ?   Right eye: No discharge.     ?   Left eye: No discharge.  ?   Conjunctiva/sclera: Conjunctivae normal.  ?   Pupils: Pupils are equal, round, and reactive to light.  ?Cardiovascular:  ?   Rate and Rhythm: Normal rate and regular rhythm.  ?   Heart sounds: No murmur heard. ?  No  friction rub. No gallop.  ?Pulmonary:  ?   Effort: Pulmonary effort is normal. No respiratory distress.  ?   Breath sounds: Normal breath sounds. No stridor. No rales.  ?Abdominal:  ?   General: There is no distension.  ?   Palpations: Abdomen is soft.  ?   Tenderness: There is no abdominal tenderness.  ?Musculoskeletal:     ?   General: No tenderness.  ?   Cervical back: Normal range of motion and neck supple.  ?Skin: ?   General: Skin is warm and dry.  ?   Findings: No erythema or rash.  ?Neurological:  ?   Mental Status: She is alert and oriented to person, place, and time.  ? ? ?ED Results and Treatments ?Labs ?(all labs ordered are listed, but only abnormal results are displayed) ?Labs Reviewed  ?RAPID URINE DRUG SCREEN, HOSP PERFORMED - Abnormal; Notable for the following components:  ?    Result Value  ? Cocaine POSITIVE (*)   ? All other components within normal limits  ?                                                                                                                       ?EKG ? EKG Interpretation ? ?Date/Time:  Wednesday Jun 09 2021 19:00:50 EDT ?Ventricular Rate:  79 ?PR Interval:  170 ?QRS Duration: 76 ?QT Interval:  382 ?QTC Calculation: 438 ?R Axis:   46 ?Text Interpretation: Normal sinus rhythm Normal ECG When compared with ECG of 26-Apr-2021 16:26, PREVIOUS ECG IS PRESENT Confirmed by Drema Pry [phone]) on 06/10/2021 12:44:29 AM ?  ? ?  ? ?Radiology ?No results found. ? ?Pertinent labs & imaging results that were available during my care of the patient were reviewed by me and considered in my medical decision making (see MDM for details). ? ?Medications Ordered in ED ?Medications - No data to display                                                               ?                                                                    ?  Procedures ?Procedures ? ?(including critical care time) ? ?Medical Decision Making / ED Course ? ? ? Complexity of Problem: ? ?Co-morbidities/SDOH that  complicate the patient evaluation/care: ?Noted above in HPI ? ?Additional history obtained: ?N/A ? ?Patient's presenting problem/concern, DDX, and MDM listed below: ?Polysubstance use requesting assistance with rehab and getting back on her psych meds. ?Patient appears to be clinically sober. ?In no acute distress. ?Does not require any labs or imaging at this time. ?She is not suicidal, homicidal or any acute psychosis that requires emergent behavioral health consultation ? ?Hospitalization Considered:  ?No ? ?Initial Intervention:  ?N/a ? ?   ?  ?ED Course:   ? ?Provided with resources ? ?The patient appears reasonably screened and/or stabilized for discharge and I doubt any other medical condition or other [facility] requiring further screening, evaluation, or treatment in the ED at this time prior to discharge. Safe for discharge with strict return precautions. ? ?Final Clinical Impression(s) / ED Diagnoses ?Final diagnoses:  ?Substance abuse (HCC)  ? ?Disposition: Discharge ? ?Condition: Good ? ?I have discussed the results, Dx and Tx plan with the patient/family who expressed understanding and agree(s) with the plan. Discharge instructions discussed at length. The patient/family was given strict return precautions who verbalized understanding of the instructions. No further questions at time of discharge.  ? ? ?ED Discharge Orders   ? ? None  ? ?  ? ? ? ? ?Follow Up: ?[facility] ?Address: [address], [place], [place] [address] ?Hours: Open 24 hours Monday through Sunday ?Phone: [phone] ?Go to  ?as needed ? ? ? ?  ? ? ? ? ? ?This chart was dictated using voice recognition software.  Despite best efforts to proofread,  errors can occur which can change the documentation meaning. ? ?  ?Nira Connardama, Phyliss Hulick Eduardo, MD ?06/10/21 0203 ? ?
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
## 2021-06-10 NOTE — Group Note (Signed)
Group Topic: Emotional Regulation  ?Group Date: 06/10/2021 ?Start Time: 1200 ?End Time: 1226 ?Facilitators: Doyne Keel E  ?Department: [facility] ? ?Number of Participants: 3  ?Group Focus: activities of daily living skills, coping skills, and self-awareness ?Treatment Modality:  Behavior Modification Therapy and Individual Therapy ?Interventions utilized were patient education and problem solving ?Purpose: enhance coping skills and express feelings ? ?Name: Alexandra Floyd Date of Birth: March 10, 1965  ?MR: [id number]   ? ?Level of Participation: active ?Quality of Participation: attentive and cooperative ?Interactions with others: gave feedback ?Mood/Affect: appropriate ?Triggers (if applicable): n/a ?Cognition: concrete ?Progress: Moderate ?Response: n/a ?Plan: follow-up needed ? ?Patients Problems:  ?Patient Active Problem List  ? Diagnosis Date Noted  ? Substance abuse (HCC) 06/10/2021  ? TSH elevation 06/10/2021  ? Substance induced mood disorder (HCC) 09/21/2019  ? Polysubstance abuse (HCC) 09/21/2019  ? 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 Cocaine use disorder, mild, abuse (HCC) 09/21/2019  ? 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 Marinol use disorder, severe, dependence (HCC) 09/21/2019  ? MDD (major depressive disorder), recurrent episode, severe (HCC) 09/20/2019  ?  ?

## 2021-06-10 NOTE — ED Provider Notes (Signed)
Facility Based Crisis Admission H&P ? ?Date: 06/10/21 ?Patient Name: Alexandra Floyd ?MRN: [id number] ?Chief Complaint:  ?Chief Complaint  ?Patient presents with  ? Anxiety  ?   ? ?Diagnoses:  ?Final diagnoses:  ?Substance abuse (HCC)  ?Alcohol abuse with intoxication (HCC)  ?Anxious appearance  ? ? ?HPI: Alexandra Floyd,  56 y.o female [facility] saying she want to detox,  pt was just [facility].  Pt report she drinks 2 beer and use cocaine prior to coming here. Pt has a history of substance abuse and Etoh.   ? ?TTS notes,  Alexandra Floyd is a 56 year old female [facility] requesting medication management and detox. Patient denied SI, HI and psychosis. Patient states "I need something to calm me down". Patient reported having panic attacks due to her anxieties. Patient reported being homeless since she has broke up with boyfriend a couple days ago and now has no where to stay. Patient admitted to using "a little cocaine and just drinking a little bit", amounts unknown. Patient was [facility], where she was brought in by EMS requesting rehab for cocaine and alcohol. ? ?Observation of patient,  she is alert,  and oriented to person,  place and time.  Mood anxious under the influence,  affect congruent with mood.  Pt denies SI,  HI, AVH or paranoia.  Per the pt she just want to get the medication stat start with "a".  I would think she is referring to ativan.    ? ?Recommend inpatient [facility]BC ? ?PHQ 2-9:   ?Flowsheet Row [facility]  ?C-SSRS RISK CATEGORY No Risk No Risk No Risk  ? ?  ?  ? ?Total Time spent with patient: 20 minutes ? ?Musculoskeletal  ?Strength & Muscle Tone: within normal limits ?Gait & Station: normal ?Patient leans:  N/A ? ?Psychiatric Specialty Exam  ?Presentation ?General Appearance: Disheveled ? ?Eye Contact:Fair ? ?Speech:Garbled ? ?Speech Volume:Increased ? ?Handedness:Ambidextrous ? ? ?Mood and Affect  ?Mood:Anxious ? ?Affect:Congruent ? ? ?Thought Process  ?Thought Processes:Linear ? ?Descriptions of Associations:Intact ? ?Orientation:Full (Time, Place and Person) ? ?Thought Content:Tangential ?   ?Hallucinations:Hallucinations: None ? ?Ideas of Reference:None ? ?Suicidal Thoughts:Suicidal Thoughts: No ? ?Homicidal Thoughts:Homicidal Thoughts: No ? ? ?Sensorium  ?Memory:Immediate Fair ? ?Judgment:Poor ? ?Insight:Poor ? ? ?Executive Functions  ?Concentration:Poor ? ?Attention Span:Fair ? ?Recall:Fair ? ?Fund of Knowledge:Fair ? ?Language:Fair ? ? ?Psychomotor Activity  ?Psychomotor Activity:Psychomotor Activity: Normal ? ? ?Assets  ?Assets:Communication Skills; Housing; Desire for Improvement ? ? ?Sleep  ?Sleep:Sleep: Fair ? ? ?Nutritional Assessment (For OBS and FBC admissions only) ?Has the patient had a weight loss or gain of 10 pounds or more in the last 3 months?: No ?Has the patient had a decrease in food intake/or appetite?: No ?Does the patient have dental problems?: No ?Does the patient have eating habits or behaviors that may be indicators of an eating disorder including binging or inducing vomiting?: No ?Has the patient recently lost weight without trying?: 0 ?Has the patient been eating poorly because of a decreased appetite?: 0 ?Malnutrition Screening Tool Score: 0 ? ? ? ?Physical Exam ?HENT:  ?   Head: Normocephalic.  ?   Nose: Nose normal.  ?Cardiovascular:  ?   Rate and Rhythm: Normal rate.  ?  Pulmonary:  ?   Effort: Pulmonary effort is normal.  ?Musculoskeletal:     ?   General: Normal range of motion.  ?   Cervical back: Normal range of motion.  ?Skin: ?   General: Skin is warm.  ?   Capillary Refill: Capillary refill takes less than 2 seconds.  ?Neurological:  ?   Mental Status: She is alert.   ?Psychiatric:     ?   Mood and Affect: Mood normal.     ?   Behavior: Behavior normal.     ?   Thought Content: Thought content normal.     ?   Judgment: Judgment normal.  ? ?Review of Systems  ?Constitutional: Negative.   ?HENT: Negative.    ?Eyes: Negative.   ?Respiratory: Negative.    ?Cardiovascular: Negative.   ?Gastrointestinal: Negative.   ?Genitourinary: Negative.   ?Musculoskeletal: Negative.   ?Skin: Negative.   ?Neurological: Negative.   ?Endo/Heme/Allergies: Negative.   ?Psychiatric/Behavioral:  Positive for substance abuse. The patient is nervous/anxious.   ? ?Blood pressure (!) 135/96, pulse 99, temperature 98.9 ?F (37.2 ?C), temperature source Tympanic, resp. rate 20, SpO2 98 %. There is no height or weight on file to calculate BMI. ? ?Past Psychiatric History: substance abuse,  ETOB,    ? ?Is the patient at risk to self? No  ?Has the patient been a risk to self in the past 6 months? No .    ?Has the patient been a risk to self within the distant past? No   ?Is the patient a risk to others? No   ?Has the patient been a risk to others in the past 6 months? No   ?Has the patient been a risk to others within the distant past? No  ? ?Past Medical History:  ?Past Medical History:  ?Diagnosis Date  ? Anxiety   ? Cellulitis   ? Depression   ? Heart murmur   ? Hep C w/ coma, chronic   ? MRSA cellulitis   ?  ?Past Surgical History:  ?Procedure Laterality Date  ? arm sx    ? CHOLECYSTECTOMY    ? ? ?Family History: No family history on file. ? ?Social History:  ?Social History  ? ?Socioeconomic History  ? Marital status: Legally Separated  ?  Spouse name: Not on file  ? Number of children: Not on file  ? Years of education: Not on file  ? Highest education level: Not on file  ?Occupational History  ? Not on file  ?Tobacco Use  ? Smoking status: Every Day  ?  Packs/day: 0.50  ?  Types: Cigarettes  ? Smokeless tobacco: Never  ?Vaping Use  ? Vaping Use: Never used  ?Substance and Sexual Activity  ? Alcohol use:  Yes  ?  Alcohol/week: 12.0 standard drinks  ?  Types: 12 Cans of beer per week  ?  Comment: heavy drinker  ? Drug use: Yes  ?  Types: "Crack" cocaine, Methamphetamines, Heroin  ?  Comment: speed x 1 mo ago;  ? Sexual activity: Yes  ?Other Topics Concern  ? Not on file  ?Social History Narrative  ? Not on file  ? ?Social Determinants of Health  ? ?Financial Resource Strain: Not on file  ?Food Insecurity: Not on file  ?Transportation Needs: Not on file  ?Physical Activity: Not on file  ?Stress: Not on file  ?Social Connections: Not on file  ?Intimate Partner Violence: Not on file  ? ? ?SDOH:  ?SDOH  Screenings  ? ?Alcohol Screen: Not on file  ?Depression (PHQ2-9): Not on file  ?Financial Resource Strain: Not on file  ?Food Insecurity: Not on file  ?Housing: Not on file  ?Physical Activity: Not on file  ?Social Connections: Not on file  ?Stress: Not on file  ?Tobacco Use: High Risk  ? Smoking Tobacco Use: Every Day  ? Smokeless Tobacco Use: Never  ? Passive Exposure: Not on file  ?Transportation Needs: Not on file  ? ? ?Last Labs:  ?Admission on 06/09/2021, Discharged on 06/10/2021  ?Component Date Value Ref Range Status  ? Opiates 06/09/2021 NONE DETECTED  NONE DETECTED Final  ? Cocaine 06/09/2021 POSITIVE (A)  NONE DETECTED Final  ? Benzodiazepines 06/09/2021 NONE DETECTED  NONE DETECTED Final  ? Amphetamines 06/09/2021 NONE DETECTED  NONE DETECTED Final  ? Tetrahydrocannabinol 06/09/2021 NONE DETECTED  NONE DETECTED Final  ? Barbiturates 06/09/2021 NONE DETECTED  NONE DETECTED Final  ? Comment: (NOTE) ?DRUG SCREEN FOR MEDICAL PURPOSES ?ONLY.  IF CONFIRMATION IS NEEDED ?FOR ANY PURPOSE, NOTIFY LAB ?WITHIN 5 DAYS. ? ?LOWEST DETECTABLE LIMITS ?FOR URINE DRUG SCREEN ?Drug Class                     Cutoff (ng/mL) ?Amphetamine and metabolites    1000 ?Barbiturate and metabolites    200 ?Benzodiazepine                 200 ?Tricyclics and metabolites     300 ?Opiates and metabolites        300 ?Cocaine and metabolites         300 ?THC                            50 ?Performed at [facility], [address], [place], [place] ?[address] ?  ? ? ?Allergies: Lasix [furosemide] ? ?PTA Medications: (Not in a hospital admission) ? ? ?Long Term Goals:

## 2021-06-10 NOTE — ED Notes (Signed)
Patient resting in bed, no distress noted. Will continue q76min check as ordered. ?
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
## 2021-06-11 ENCOUNTER — Encounter (HOSPITAL_COMMUNITY): Payer: Self-pay

## 2021-06-11 DIAGNOSIS — F1910 Other psychoactive substance abuse, uncomplicated: Secondary | ICD-10-CM | POA: Diagnosis not present

## 2021-06-11 DIAGNOSIS — F10129 Alcohol abuse with intoxication, unspecified: Secondary | ICD-10-CM | POA: Diagnosis not present

## 2021-06-11 DIAGNOSIS — Z20822 Contact with and (suspected) exposure to covid-19: Secondary | ICD-10-CM | POA: Diagnosis not present

## 2021-06-11 MED ORDER — LIDOCAINE 5 % EX PTCH
1.0000 | MEDICATED_PATCH | CUTANEOUS | Status: DC
Start: 2021-06-11 — End: 2021-06-14
  Administered 2021-06-11 – 2021-06-13 (×3): 1 via TRANSDERMAL
  Filled 2021-06-11 (×4): qty 1
  Filled 2021-06-11: qty 14

## 2021-06-11 NOTE — ED Notes (Signed)
Pt had a shower. 

## 2021-06-11 NOTE — Progress Notes (Signed)
Pt is resting quietly. No distress noted or concerns voiced. Staff will monitor for pt's safety. ?

## 2021-06-11 NOTE — Group Note (Signed)
Group Topic: Recovery Basics  ?Group Date: 06/11/2021 ?Start Time: 1020 ?End Time: [id number] ?Facilitators: Mervyn Skeeters D, NT  ?Department: [facility] ? ?Number of Participants: 3  ?Group Focus: anxiety, clarity of thought, coping skills, and daily focus ?Treatment Modality:  Psychoeducation ?Interventions utilized were problem solving ?Purpose: enhance coping skills and regain self-worth ? ?Name: Alexandra Floyd Date of Birth: 1965/03/30  ?MR: [id number]   ? ?Level of Participation: active ?Quality of Participation: attentive and cooperative ?Interactions with others: gave feedback ?Mood/Affect: appropriate ?Triggers (if applicable): N/A ?Cognition: coherent/clear and insightful ?Progress: Moderate ?Response: N/A ?Plan: follow-up needed ? ?Patients Problems:  ?Patient Active Problem List  ? Diagnosis Date Noted  ? Substance abuse ([place]) 06/10/2021  ? TSH elevation 06/10/2021  ? Substance induced mood disorder ([place]) 09/21/2019  ? Polysubstance abuse ([place]) 09/21/2019  ? Alcohol intoxication with moderate or severe use disorder ([place]) 09/21/2019  ? Cocaine use disorder, mild, abuse ([place]) 09/21/2019  ? Cocaine intoxication with perceptual disturbances with moderate or severe use disorder ([place]) 09/21/2019  ? Marinol use disorder, severe, dependence ([place]) 09/21/2019  ? MDD (major depressive disorder), recurrent episode, severe ([place]) 09/20/2019  ?  ?

## 2021-06-11 NOTE — BH IP Treatment Plan (Signed)
Interdisciplinary Treatment and Diagnostic Plan Update ? ?06/11/2021 ?Time of Session: 10:30AM ? ?Alexandra Floyd ?MRN: [id number] ? ?Diagnosis:  ?Final diagnoses:  ?Substance abuse (HCC)  ?Alcohol abuse with intoxication (HCC)  ?Anxious appearance  ?TSH elevation  ? ? ? ?Current Medications:  ?Current Facility-Administered Medications  ?Medication Dose Route Frequency Provider Last Rate Last Admin  ? acetaminophen (TYLENOL) tablet 650 mg  650 mg Oral Q6H PRN Alexandra Guadeloupe, NP   650 mg at 06/11/21 0319  ? alum & mag hydroxide-simeth (MAALOX/MYLANTA) 200-200-20 MG/5ML suspension 30 mL  30 mL Oral Q4H PRN Alexandra Guadeloupe, NP      ? DULoxetine (CYMBALTA) DR capsule 20 mg  20 mg Oral Daily Alexandra Husk, MD   20 mg at 06/11/21 2725  ? hydrOXYzine (ATARAX) tablet 25 mg  25 mg Oral Q6H PRN Alexandra Guadeloupe, NP   25 mg at 06/11/21 0319  ? levothyroxine (SYNTHROID) tablet 25 mcg  25 mcg Oral Q0600 Alexandra Husk, MD   25 mcg at 06/11/21 0606  ? lidocaine (LIDODERM) 5 % 1 patch  1 patch Transdermal Q24H Alexandra Husk, MD   1 patch at 06/11/21 1326  ? loperamide (IMODIUM) capsule 2-4 mg  2-4 mg Oral PRN Alexandra Guadeloupe, NP      ? LORazepam (ATIVAN) tablet 1 mg  1 mg Oral Q6H PRN Alexandra Guadeloupe, NP   1 mg at 06/10/21 1030  ? magnesium hydroxide (MILK OF MAGNESIA) suspension 30 mL  30 mL Oral Daily PRN Alexandra Guadeloupe, NP      ? multivitamin with minerals tablet 1 tablet  1 tablet Oral Daily Alexandra Guadeloupe, NP   1 tablet at 06/11/21 3664  ? naproxen (NAPROSYN) tablet 500 mg  500 mg Oral BID PRN Alexandra Guadeloupe, NP   500 mg at 06/11/21 4034  ? ondansetron (ZOFRAN-ODT) disintegrating tablet 4 mg  4 mg Oral Q6H PRN Alexandra Guadeloupe, NP   4 mg at 06/10/21 0420  ? thiamine (B-1) injection 100 mg  100 mg Intramuscular Once Alexandra Guadeloupe, NP      ? thiamine tablet 100 mg  100 mg Oral Daily Alexandra Guadeloupe, NP   100 mg at 06/11/21 7425  ? ?No current outpatient medications on file.  ? ?PTA Medications: ?Prior to Admission medications    ?Not on File  ? ? ?Patient Stressors: Financial difficulties   ?Health problems   ?Medication change or noncompliance   ?Occupational concerns   ?Substance abuse   ?Other: Homelessness   ? ?Patient Strengths: Motivation for treatment/growth  ? ?Treatment Modalities: Medication Management, Group therapy, Case management,  ?1 to 1 session with clinician, Psychoeducation, Recreational therapy. ? ? ?Physician Treatment Plan for Primary and Secondary Diagnosis:  ?Final diagnoses:  ?Substance abuse (HCC)  ?Alcohol abuse with intoxication (HCC)  ?Anxious appearance  ?TSH elevation  ? ?Long Term Goal(s): Improvement in symptoms so as ready for discharge ? ?Short Term Goals: Ability to verbalize feelings will improve ?Ability to disclose and discuss suicidal ideas ?Ability to demonstrate self-control will improve ?Ability to identify and develop effective coping behaviors will improve ?Ability to maintain clinical measurements within normal limits will improve ? ?Medication Management: Evaluate patient's response, side effects, and tolerance of medication regimen. ? ?Therapeutic Interventions: 1 to 1 sessions, Unit Group sessions and Medication administration. ? ?Evaluation of Outcomes: Progressing ? ?RN Treatment Plan for Primary Diagnosis:  ?Final diagnoses:  ?Substance abuse (HCC)  ?Alcohol abuse with intoxication (HCC)  ?Anxious appearance  ?TSH elevation  ? ? ?Long  Term Goal(s): Knowledge of disease and therapeutic regimen to maintain health will improve ? ?Short Term Goals: Ability to identify and develop effective coping behaviors will improve and Compliance with prescribed medications will improve ? ?Medication Management: RN will administer medications as ordered by provider, will assess and evaluate patient's response and provide education to patient for prescribed medication. RN will report any adverse and/or side effects to prescribing provider. ? ?Therapeutic Interventions: 1 on 1 counseling sessions,  Psychoeducation, Medication administration, Evaluate responses to treatment, Monitor vital signs and CBGs as ordered, Perform/monitor CIWA, COWS, AIMS and Fall Risk screenings as ordered, Perform wound care treatments as ordered. ? ?Evaluation of Outcomes: Progressing ? ? ?LCSW Treatment Plan for Primary Diagnosis:  ?Final diagnoses:  ?Substance abuse (HCC)  ?Alcohol abuse with intoxication (HCC)  ?Anxious appearance  ?TSH elevation  ? ? ?Long Term Goal(s): Safe transition to appropriate next level of care at discharge, Engage patient in therapeutic group addressing interpersonal concerns. ? ?Short Term Goals: Engage patient in aftercare planning with referrals and resources ? ?Therapeutic Interventions: Assess for all discharge needs, 1 to 1 time with Child psychotherapist, Explore available resources and support systems, Assess for adequacy in community support network, Educate family and significant other(s) on suicide prevention, Complete Psychosocial Assessment, Interpersonal group therapy. ? ?Evaluation of Outcomes: Adequate for Discharge ? ? ?Progress in Treatment: ?Attending groups: Yes. ?Participating in groups: Yes. ?Taking medication as prescribed: Yes. ?Toleration medication: Yes. ?Family/Significant other contact made: No, will contact:  no one ?Patient understands diagnosis: Yes. ?Discussing patient identified problems/goals with staff: Yes. ?Medical problems stabilized or resolved: Yes. ?Denies suicidal/homicidal ideation: Yes. ?Issues/concerns per patient self-inventory: No. ?Other: None ? ?New problem(s) identified: None  ? ?New Short Term/Long Term Goal(s): Patient wants to engage in residential treatment services. She also wants to implement outpatient substance abuse treatment counseling services into her weekly regiment. Alexandra Floyd agrees that participating in outpatient substance abuse counseling and/or treatment at least 1-2x a week.  ? ?Patient Goals:  "I need to get clean"  ? ?Discharge Plan or  Barriers: [name] that she has been accepted to [facility] on Monday, 06/14/21.  ? ?Reason for Continuation of Hospitalization: Medication stabilization ? ?Estimated Length of Stay: Monday, 06/14/21.  ? ?Last 3 [place] Suicide Severity Risk Score: ?Flowsheet Row ED from 06/10/2021 in [facility] ED from 06/09/2021 in [facility] EMERGENCY DEPARTMENT ED from 05/22/2021 in [place] EMERGENCY DEPARTMENT  ?C-SSRS RISK CATEGORY No Risk No Risk No Risk  ? ?  ? ? ?Last PHQ 2/9 Scores: ? ?  06/10/2021  ? 11:17 AM  ?Depression screen PHQ 2/9  ?Decreased Interest 1  ?Down, Depressed, Hopeless 2  ?PHQ - 2 Score 3  ?Altered sleeping 2  ?Tired, decreased energy 2  ?Change in appetite 1  ?Feeling bad or failure about yourself  1  ?Trouble concentrating 2  ?Moving slowly or fidgety/restless 1  ?Suicidal thoughts 1  ?PHQ-9 Score 13  ? ? ?Scribe for Treatment Team: ?Maeola Sarah, LCSW ?06/11/2021 ?3:37 PM ? ?

## 2021-06-11 NOTE — ED Notes (Signed)
Pt went outside the courtyard with staff ?

## 2021-06-11 NOTE — ED Provider Notes (Signed)
Behavioral Health Progress Note ? ?Date and Time: 06/11/2021 12:19 PM ?Name: Alexandra Floyd ?MRN:  [id number] ? ?Subjective:   ?56 year old female with history of anxiety, depression, polysubstance abuse (alcohol, cocaine, methamphetamine, heroin), substance-induced mood disorder who presented to the [facility] on 06/10/2021 with desire for detox and substance use treatment.  UDS + cocaine; EtOH negative. ? ?Patient seen and chart reviewed-most recent CIWA 0.  Patient seen in conjunction with LCSW this morning; patient noted to be eating a snack.  Patient describes her mood as "good".  She reports physical complaints of  chronic back pain and a stomach ache.  Patient attributes stomach ache to increased anxiety and reports that is not unusual for her to experience this in times of increased anxiety. She denies alcohol withdrawal sx of tremors, nausea, vomiting, sweating, palpitations.  She reports sleeping poorly.  Discussed options to assist with back pain and anxiety' patient declines gabapentin. After discussion, patient volunteers that she has used lidocaine patches at home for her back and has found this to be helpful. Is agreeable to start lidocaine patches today. Denies SI/HI/AVH. Informed patient that she has been accepted to [facility] on Monday-patient verbalizes understanding and is in agreement. ?Diagnosis:  ?Final diagnoses:  ?Substance abuse ([place])  ?Alcohol abuse with intoxication ([place])  ?Anxious appearance  ?TSH elevation  ? ? ?Total Time spent with patient: 15 minutes ? ?Past Psychiatric History: anxiety, depression, polysubstance abuse (alcohol, cocaine, methamphetamine, heroin), substance-induced mood disorder  ?Past Medical History:  ?Past Medical History:  ?Diagnosis Date  ? Anxiety   ? Cellulitis   ? Depression   ? Heart murmur   ? Hep C w/ coma, chronic   ? MRSA cellulitis   ?  ?Past Surgical History:  ?Procedure Laterality Date  ? arm sx    ? CHOLECYSTECTOMY    ? ?Family History: History reviewed. No  pertinent family history. ?Family Psychiatric  History:  ?Per chart review, she states that her son committed suicide at 52 yo ?Social History:  ?Social History  ? ?Substance and Sexual Activity  ?Alcohol Use Yes  ? Alcohol/week: 12.0 standard drinks  ? Types: 12 Cans of beer per week  ? Comment: heavy drinker  ?   ?Social History  ? ?Substance and Sexual Activity  ?Drug Use Yes  ? Types: "Crack" cocaine, Methamphetamines, Heroin  ? Comment: speed x 1 mo ago;  ?  ?Social History  ? ?Socioeconomic History  ? Marital status: Legally Separated  ?  Spouse name: Not on file  ? Number of children: Not on file  ? Years of education: Not on file  ? Highest education level: Not on file  ?Occupational History  ? Not on file  ?Tobacco Use  ? Smoking status: Every Day  ?  Packs/day: 0.50  ?  Types: Cigarettes  ? Smokeless tobacco: Never  ?Vaping Use  ? Vaping Use: Never used  ?Substance and Sexual Activity  ? Alcohol use: Yes  ?  Alcohol/week: 12.0 standard drinks  ?  Types: 12 Cans of beer per week  ?  Comment: heavy drinker  ? Drug use: Yes  ?  Types: "Crack" cocaine, Methamphetamines, Heroin  ?  Comment: speed x 1 mo ago;  ? Sexual activity: Yes  ?Other Topics Concern  ? Not on file  ?Social History Narrative  ? Not on file  ? ?Social Determinants of Health  ? ?Financial Resource Strain: Not on file  ?Food Insecurity: Not on file  ?Transportation Needs: Not on file  ?  Physical Activity: Not on file  ?Stress: Not on file  ?Social Connections: Not on file  ? ?SDOH:  ?SDOH Screenings  ? ?Alcohol Screen: Not on file  ?Depression (PHQ2-9): Medium Risk  ? PHQ-2 Score: 13  ?Financial Resource Strain: Not on file  ?Food Insecurity: Not on file  ?Housing: Not on file  ?Physical Activity: Not on file  ?Social Connections: Not on file  ?Stress: Not on file  ?Tobacco Use: High Risk  ? Smoking Tobacco Use: Every Day  ? Smokeless Tobacco Use: Never  ? Passive Exposure: Not on file  ?Transportation Needs: Not on file  ? ?Additional Social  History:  ?  ?Pain Medications: see MAR ?Prescriptions: see MAR ?Over the Counter: see MAR ?History of alcohol / drug use?: Yes ?Name of Substance 1: cocaine ?1 - Amount (size/oz): "little bit" ?1 - Frequency: 2-3x weekly ?1 - Last Use / Amount: yesterday ?Name of Substance 2: alcohol ?2 - Amount (size/oz): "little bit" ?2 - Frequency: 2-3x weekly ?2 - Last Use / Amount: yesterday ?  ?  ?  ?  ?  ?  ?  ? ?Sleep: Fair ? ?Appetite:  Good ? ?Current Medications:  ?Current Facility-Administered Medications  ?Medication Dose Route Frequency Provider Last Rate Last Admin  ? acetaminophen (TYLENOL) tablet 650 mg  650 mg Oral Q6H PRN Evette Georges, NP   650 mg at 06/11/21 0319  ? alum & mag hydroxide-simeth (MAALOX/MYLANTA) 200-200-20 MG/5ML suspension 30 mL  30 mL Oral Q4H PRN Evette Georges, NP      ? DULoxetine (CYMBALTA) DR capsule 20 mg  20 mg Oral Daily Ival Bible, MD   20 mg at 06/11/21 [id number]  ? hydrOXYzine (ATARAX) tablet 25 mg  25 mg Oral Q6H PRN Evette Georges, NP   25 mg at 06/11/21 0319  ? levothyroxine (SYNTHROID) tablet 25 mcg  25 mcg Oral Q0600 Ival Bible, MD   25 mcg at 06/11/21 0606  ? lidocaine (LIDODERM) 5 % 1 patch  1 patch Transdermal Q24H Ival Bible, MD      ? loperamide (IMODIUM) capsule 2-4 mg  2-4 mg Oral PRN Evette Georges, NP      ? LORazepam (ATIVAN) tablet 1 mg  1 mg Oral Q6H PRN Evette Georges, NP   1 mg at 06/10/21 1030  ? magnesium hydroxide (MILK OF MAGNESIA) suspension 30 mL  30 mL Oral Daily PRN Evette Georges, NP      ? multivitamin with minerals tablet 1 tablet  1 tablet Oral Daily Evette Georges, NP   1 tablet at 06/11/21 [id number]  ? naproxen (NAPROSYN) tablet 500 mg  500 mg Oral BID PRN Evette Georges, NP   500 mg at 06/11/21 [id number]  ? ondansetron (ZOFRAN-ODT) disintegrating tablet 4 mg  4 mg Oral Q6H PRN Evette Georges, NP   4 mg at 06/10/21 0420  ? thiamine (B-1) injection 100 mg  100 mg Intramuscular Once Evette Georges, NP      ? thiamine tablet 100 mg  100 mg Oral  Daily Evette Georges, NP   100 mg at 06/11/21 [id number]  ? ?No current outpatient medications on file.  ? ? ?Labs  ?Lab Results:  ?Admission on 06/10/2021  ?Component Date Value Ref Range Status  ? SARS Coronavirus 2 by RT PCR 06/10/2021 NEGATIVE  NEGATIVE Final  ? Comment: (NOTE) ?SARS-CoV-2 target nucleic acids are NOT DETECTED. ? ?The SARS-CoV-2 RNA is generally detectable in upper respiratory ?specimens during the acute phase of infection. The  lowest ?concentration of SARS-CoV-2 viral copies this assay can detect is ?138 copies/mL. A negative result does not preclude SARS-Cov-2 ?infection and should not be used as the sole basis for treatment or ?other patient management decisions. A negative result may occur with  ?improper specimen collection/handling, submission of specimen other ?than nasopharyngeal swab, presence of viral mutation(s) within the ?areas targeted by this assay, and inadequate number of viral ?copies(<138 copies/mL). A negative result must be combined with ?clinical observations, patient history, and epidemiological ?information. The expected result is Negative. ? ?Fact Sheet for Patients:  ?EntrepreneurPulse.com.au ? ?Fact Sheet for Healthcare Providers:  ?IncredibleEmployment.be ? ?This test is no  ?                        t yet approved or cleared by the [place] FDA and  ?has been authorized for detection and/or diagnosis of SARS-CoV-2 by ?FDA under an Emergency Use Authorization (EUA). This EUA will remain  ?in effect (meaning this test can be used) for the duration of the ?COVID-19 declaration under Section 564(b)(1) of the Act, 21 ?U.S.C.section 360bbb-3(b)(1), unless the authorization is terminated  ?or revoked sooner.  ? ? ?  ? Influenza A by PCR 06/10/2021 NEGATIVE  NEGATIVE Final  ? Influenza B by PCR 06/10/2021 NEGATIVE  NEGATIVE Final  ? Comment: (NOTE) ?The Xpert Xpress SARS-CoV-2/FLU/RSV plus assay is intended as an aid ?in the diagnosis of  influenza from Nasopharyngeal swab specimens and ?should not be used as a sole basis for treatment. Nasal washings and ?aspirates are unacceptable for Xpert Xpress SARS-CoV-2/FLU/RSV ?testing. ? ?Fact Sheet for P

## 2021-06-11 NOTE — ED Notes (Signed)
Pt up to nurse's station stating she had a nightmare and requesting anxiety medication. Pt also reports back pain 8/10. PRN medications given then pt went to lay back down in bed. No signs of acute distress noted. Will continue to monitor for safety.  ?

## 2021-06-11 NOTE — Progress Notes (Signed)
Pt is awake, alert and oriented. Pt complained chronic back pain. No signs of acute distress noted. PRN Naproxen and scheduled meds administered with no issue. Pt denies current SI/HI/AVH. Staff will monitor for pt's safety.  ?

## 2021-06-11 NOTE — ED Notes (Signed)
Provided pt with journal and encouraged pt to write thoughts down.  Safety maintained. ?

## 2021-06-11 NOTE — ED Notes (Signed)
Pt sleeping@this time. Breathing even and unlabored. Will continue to monitor for safety 

## 2021-06-11 NOTE — ED Notes (Addendum)
Patient came out of her room to have a quick meal. ?

## 2021-06-11 NOTE — ED Notes (Signed)
Pt resting in bed. A&O x4, calm and cooperative. Denies current SI/HI/AVH. Denies any current needs. No signs of acute distress noted. Will continue to monitor for safety.  

## 2021-06-11 NOTE — ED Notes (Signed)
Snacks given 

## 2021-06-11 NOTE — Clinical Social Work Psych Note (Addendum)
LCSW Update Note ? ?Alexandra Floyd reports she was doing "okay" this morning. She did report experiencing back and stomach aches, however denied having any additional concerns. Alexandra Floyd denied having any SI, HI or AVH at this time.   ? ?Alexandra Floyd and Dr. Bronwen Betters, MD discussed treatment options for her back pains. Alexandra Floyd was agreeable to start lidocaine patches today.  ? ?LCSW informed Alexandra Floyd that she has been accepted to [facility] on Monday, 06/14/21 at 9:00AM. She will require her 14-day supply of medication samples, in addition to her 30-day prescriptions. ? ? Alexandra Floyd expressed understanding and is agreeable with discharge planning. Alexandra Floyd will be transported via taxi voucher. Taxi voucher has been placed on the patient's physical chart in the [facility] Nursing station.  ? ? ?LCSW will continue to follow until discharge.  ? ? ? ? ?Baldo Daub, MSW, LCSW ?Clinical Child psychotherapist Hydrographic surveyor) ?[facility]  ? ?

## 2021-06-12 DIAGNOSIS — F1910 Other psychoactive substance abuse, uncomplicated: Secondary | ICD-10-CM | POA: Diagnosis not present

## 2021-06-12 DIAGNOSIS — Z20822 Contact with and (suspected) exposure to covid-19: Secondary | ICD-10-CM | POA: Diagnosis not present

## 2021-06-12 DIAGNOSIS — F10129 Alcohol abuse with intoxication, unspecified: Secondary | ICD-10-CM | POA: Diagnosis not present

## 2021-06-12 LAB — T3, FREE: T3, Free: 4.1 pg/mL (ref 2.0–4.4)

## 2021-06-12 NOTE — ED Provider Notes (Signed)
Behavioral Health Progress Note ? ?Date and Time: 06/12/2021 1:05 PM ?Name: Alexandra Floyd ?MRN:  [id number] ? ?Subjective: Alexandra Floyd is a 56 year old female with history of anxiety, depression, polysubstance abuse (alcohol, cocaine, methamphetamine, heroin), substance-induced mood disorder who presented to the [facility] on 06/10/2021 with desire for detox and substance use treatment. UDS + cocaine; EtOH negative. ? ?Patient seen and evaluated face-to-face by this provider, and chart reviewed. On evaluation, patient is alert and oriented x4. Her thought process is logical and goal oriented. Her speech is clear and coherent. Her mood is anxious and affect is congruent. She reports experiencing a panic attack this morning that she describes as having racing thoughts. She states that she was able to distract her thoughts. She denies depressive symptoms. She denies suicidal or homicidal ideations. She denies auditory or visual hallucinations. There is no objective evidence that the patient is currently responding to internal or external stimuli. She denies alcohol withdrawal symptoms. She verbalizes readiness to discharge to [facility] on Monday for residential treatment. She denies medication side effects. ? ?Diagnosis:  ?Final diagnoses:  ?Substance abuse (HCC)  ?Alcohol abuse with intoxication (HCC)  ?Anxious appearance  ?TSH elevation  ? ? ?Total Time spent with patient: 15 minutes ? ?Past Psychiatric History: per chart review hx of anxiety, depression, polysubstance abuse (alcohol, cocaine, methamphetamine, heroin), substance-induced mood disorder ?Past Medical History:  ?Past Medical History:  ?Diagnosis Date  ? Anxiety   ? Cellulitis   ? Depression   ? Heart murmur   ? Hep C w/ coma, chronic   ? MRSA cellulitis   ?  ?Past Surgical History:  ?Procedure Laterality Date  ? arm sx    ? CHOLECYSTECTOMY    ? ?Family History: History reviewed. No pertinent family history. ?Family Psychiatric  History: Per chart review, she  states that her son committed suicide at 45 yo ?Social History:  ?Social History  ? ?Substance and Sexual Activity  ?Alcohol Use Yes  ? Alcohol/week: 12.0 standard drinks  ? Types: 12 Cans of beer per week  ? Comment: heavy drinker  ?   ?Social History  ? ?Substance and Sexual Activity  ?Drug Use Yes  ? Types: "Crack" cocaine, Methamphetamines, Heroin  ? Comment: speed x 1 mo ago;  ?  ?Social History  ? ?Socioeconomic History  ? Marital status: Legally Separated  ?  Spouse name: Not on file  ? Number of children: Not on file  ? Years of education: Not on file  ? Highest education level: Not on file  ?Occupational History  ? Not on file  ?Tobacco Use  ? Smoking status: Every Day  ?  Packs/day: 0.50  ?  Types: Cigarettes  ? Smokeless tobacco: Never  ?Vaping Use  ? Vaping Use: Never used  ?Substance and Sexual Activity  ? Alcohol use: Yes  ?  Alcohol/week: 12.0 standard drinks  ?  Types: 12 Cans of beer per week  ?  Comment: heavy drinker  ? Drug use: Yes  ?  Types: "Crack" cocaine, Methamphetamines, Heroin  ?  Comment: speed x 1 mo ago;  ? Sexual activity: Yes  ?Other Topics Concern  ? Not on file  ?Social History Narrative  ? Not on file  ? ?Social Determinants of Health  ? ?Financial Resource Strain: Not on file  ?Food Insecurity: Not on file  ?Transportation Needs: Not on file  ?Physical Activity: Not on file  ?Stress: Not on file  ?Social Connections: Not on file  ? ?SDOH:  ?SDOH  Screenings  ? ?Alcohol Screen: Not on file  ?Depression (PHQ2-9): Medium Risk  ? PHQ-2 Score: 13  ?Financial Resource Strain: Not on file  ?Food Insecurity: Not on file  ?Housing: Not on file  ?Physical Activity: Not on file  ?Social Connections: Not on file  ?Stress: Not on file  ?Tobacco Use: High Risk  ? Smoking Tobacco Use: Every Day  ? Smokeless Tobacco Use: Never  ? Passive Exposure: Not on file  ?Transportation Needs: Not on file  ? ?Additional Social History:  ?  ?Pain Medications: see MAR ?Prescriptions: see MAR ?Over the  Counter: see MAR ?History of alcohol / drug use?: Yes ?Name of Substance 1: cocaine ?1 - Amount (size/oz): "little bit" ?1 - Frequency: 2-3x weekly ?1 - Last Use / Amount: yesterday ?Name of Substance 2: alcohol ?2 - Amount (size/oz): "little bit" ?2 - Frequency: 2-3x weekly ?2 - Last Use / Amount: yesterday ?  ?Current Medications:  ?Current Facility-Administered Medications  ?Medication Dose Route Frequency Provider Last Rate Last Admin  ? acetaminophen (TYLENOL) tablet 650 mg  650 mg Oral Q6H PRN Sindy Guadeloupe, NP   650 mg at 06/11/21 0319  ? alum & mag hydroxide-simeth (MAALOX/MYLANTA) 200-200-20 MG/5ML suspension 30 mL  30 mL Oral Q4H PRN Sindy Guadeloupe, NP      ? DULoxetine (CYMBALTA) DR capsule 20 mg  20 mg Oral Daily Estella Husk, MD   20 mg at 06/12/21 1024  ? hydrOXYzine (ATARAX) tablet 25 mg  25 mg Oral Q6H PRN Sindy Guadeloupe, NP   25 mg at 06/11/21 0319  ? levothyroxine (SYNTHROID) tablet 25 mcg  25 mcg Oral Q0600 Estella Husk, MD   25 mcg at 06/12/21 0617  ? lidocaine (LIDODERM) 5 % 1 patch  1 patch Transdermal Q24H Estella Husk, MD   1 patch at 06/12/21 1223  ? loperamide (IMODIUM) capsule 2-4 mg  2-4 mg Oral PRN Sindy Guadeloupe, NP      ? LORazepam (ATIVAN) tablet 1 mg  1 mg Oral Q6H PRN Sindy Guadeloupe, NP   1 mg at 06/10/21 1030  ? magnesium hydroxide (MILK OF MAGNESIA) suspension 30 mL  30 mL Oral Daily PRN Sindy Guadeloupe, NP      ? multivitamin with minerals tablet 1 tablet  1 tablet Oral Daily Sindy Guadeloupe, NP   1 tablet at 06/12/21 1024  ? naproxen (NAPROSYN) tablet 500 mg  500 mg Oral BID PRN Sindy Guadeloupe, NP   500 mg at 06/11/21 0086  ? ondansetron (ZOFRAN-ODT) disintegrating tablet 4 mg  4 mg Oral Q6H PRN Sindy Guadeloupe, NP   4 mg at 06/10/21 0420  ? thiamine (B-1) injection 100 mg  100 mg Intramuscular Once Sindy Guadeloupe, NP      ? thiamine tablet 100 mg  100 mg Oral Daily Sindy Guadeloupe, NP   100 mg at 06/12/21 1024  ? ?No current outpatient medications on file.  ? ? ?Labs   ?Lab Results:  ?Admission on 06/10/2021  ?Component Date Value Ref Range Status  ? SARS Coronavirus 2 by RT PCR 06/10/2021 NEGATIVE  NEGATIVE Final  ? Comment: (NOTE) ?SARS-CoV-2 target nucleic acids are NOT DETECTED. ? ?The SARS-CoV-2 RNA is generally detectable in upper respiratory ?specimens during the acute phase of infection. The lowest ?concentration of SARS-CoV-2 viral copies this assay can detect is ?138 copies/mL. A negative result does not preclude SARS-Cov-2 ?infection and should not be used as the sole basis for treatment or ?other patient management decisions. A  negative result may occur with  ?improper specimen collection/handling, submission of specimen other ?than nasopharyngeal swab, presence of viral mutation(s) within the ?areas targeted by this assay, and inadequate number of viral ?copies(<138 copies/mL). A negative result must be combined with ?clinical observations, patient history, and epidemiological ?information. The expected result is Negative. ? ?Fact Sheet for Patients:  ?BloggerCourse.comhttps://www.fda.gov/media/152166/download ? ?Fact Sheet for Healthcare Providers:  ?SeriousBroker.ithttps://www.fda.gov/media/152162/download ? ?This test is no  ?                        t yet approved or cleared by the [place] FDA and  ?has been authorized for detection and/or diagnosis of SARS-CoV-2 by ?FDA under an Emergency Use Authorization (EUA). This EUA will remain  ?in effect (meaning this test can be used) for the duration of the ?COVID-19 declaration under Section 564(b)(1) of the Act, 21 ?U.S.C.section 360bbb-3(b)(1), unless the authorization is terminated  ?or revoked sooner.  ? ? ?  ? Influenza A by PCR 06/10/2021 NEGATIVE  NEGATIVE Final  ? Influenza B by PCR 06/10/2021 NEGATIVE  NEGATIVE Final  ? Comment: (NOTE) ?The Xpert Xpress SARS-CoV-2/FLU/RSV plus assay is intended as an aid ?in the diagnosis of influenza from Nasopharyngeal swab specimens and ?should not be used as a sole basis for treatment. Nasal  washings and ?aspirates are unacceptable for Xpert Xpress SARS-CoV-2/FLU/RSV ?testing. ? ?Fact Sheet for Patients: ?BloggerCourse.comhttps://www.fda.gov/media/152166/download ? ?Fact Sheet for Healthcare Providers: ?https://www.fda

## 2021-06-12 NOTE — ED Notes (Signed)
Snacks given 

## 2021-06-12 NOTE — ED Notes (Signed)
Patient participating well in groups. Patient is cooperative and interacts well with staff.  Respiratory is even and unlabored. No distress noted.  will continue to monitor for safety.  

## 2021-06-12 NOTE — ED Notes (Signed)
Patient remains asleep in bed without issue or complaint.  Will monitor and provide support as needed. ?

## 2021-06-12 NOTE — Progress Notes (Signed)
Patient sitting in dayroom with peers most of the afternoon.  Able to make needs known.  No complaints or distress and no withdrawal symptoms.  Denies avh shi or plan.  Will monitor and provide a safe environment.   ?

## 2021-06-12 NOTE — ED Notes (Signed)
Pt asleep in bed. Respirations even and unlabored. Will continue to monitor for safety. ?

## 2021-06-13 DIAGNOSIS — Z20822 Contact with and (suspected) exposure to covid-19: Secondary | ICD-10-CM | POA: Diagnosis not present

## 2021-06-13 DIAGNOSIS — F10129 Alcohol abuse with intoxication, unspecified: Secondary | ICD-10-CM | POA: Diagnosis not present

## 2021-06-13 DIAGNOSIS — F1910 Other psychoactive substance abuse, uncomplicated: Secondary | ICD-10-CM | POA: Diagnosis not present

## 2021-06-13 MED ORDER — TRAZODONE HCL 50 MG PO TABS
50.0000 mg | ORAL_TABLET | Freq: Once | ORAL | Status: AC
  Administered 2021-06-13: 50 mg via ORAL
  Filled 2021-06-13: qty 1

## 2021-06-13 MED ORDER — LIDOCAINE 5 % EX PTCH: 1.0000 | MEDICATED_PATCH | CUTANEOUS | 0 refills | Status: DC

## 2021-06-13 MED ORDER — ADULT MULTIVITAMIN W/MINERALS CH: 1.0000 | ORAL_TABLET | Freq: Every day | ORAL | 1 refills | Status: DC

## 2021-06-13 MED ORDER — DULOXETINE HCL 20 MG PO CPEP: 20.0000 mg | ORAL_CAPSULE | Freq: Every day | ORAL | 1 refills | Status: DC

## 2021-06-13 MED ORDER — THIAMINE HCL 100 MG PO TABS: 100.0000 mg | ORAL_TABLET | Freq: Every day | ORAL | 1 refills | Status: DC

## 2021-06-13 MED ORDER — LEVOTHYROXINE SODIUM 25 MCG PO TABS: 25.0000 ug | ORAL_TABLET | Freq: Every day | ORAL | 1 refills | Status: DC

## 2021-06-13 NOTE — ED Provider Notes (Signed)
FBC/OBS ASAP Discharge Summary ? ?Date and Time: 06/13/2021 3:23 PM  ?Name: Alexandra MaudlinJennifer Floyd  ?MRN:  [id number]  ? ?Discharge Diagnoses:  ?Final diagnoses:  ?Substance abuse (HCC)  ?Alcohol abuse with intoxication (HCC)  ?Anxious appearance  ?TSH elevation  ? ? ?Subjective: Patient seen and evaluated face-to-face by this provider, chart reviewed. On evaluation, patient is alert and oriented x4. Her thought process is logical and goal oriented. Her speech is clear and coherent. Her mood is anxious and affect is congruent. She denies suicidal or homicidal ideations. She verbally contracts for [facility]. She denies auditory or visual hallucinations. There is no objective evidence that she is currently responding to internal or external stimuli. She reports decreased depressive symptoms and states that she continues to want to sleep a lot during the day and isolate. She reports fair sleep. She reports a good appetite. She reports feeling anxious and worriying about what to [facility]. She denies withdrawal symptoms. There is no evidence that she is currently withdrawing. She is medication compliant and denies any side effects to the medications.  Patient denies any concerns or questions at this time. Patient verbalizes readiness to [facility]. ? ?Stay Summary: Alexandra Floyd is a 56 year old female with history of anxiety, depression, polysubstance abuse (alcohol, cocaine, methamphetamine, heroin), substance-induced mood disorder who [facility] with desire for detox and substance use treatment. UDS + cocaine; EtOH negative. Patient [facility] for residential treatment.  ?  ? ?Total Time spent with patient: 20 minutes ? ?Past Psychiatric History: per chart review hx of anxiety, depression, polysubstance abuse (alcohol, cocaine, methamphetamine, heroin), substance-induced mood  disorder ?Past Medical History:  ?Past Medical History:  ?Diagnosis Date  ? Anxiety   ? Cellulitis   ? Depression   ? Heart murmur   ? Hep C w/ coma, chronic   ? MRSA cellulitis   ?  ?Past Surgical History:  ?Procedure Laterality Date  ? arm sx    ? CHOLECYSTECTOMY    ? ?Family History: History reviewed. No pertinent family history. ?Family Psychiatric History: Per chart review, she states that her son committed suicide at 56 yo ?Social History:  ?Social History  ? ?Substance and Sexual Activity  ?Alcohol Use Yes  ? Alcohol/week: 12.0 standard drinks  ? Types: 12 Cans of beer per week  ? Comment: heavy drinker  ?   ?Social History  ? ?Substance and Sexual Activity  ?Drug Use Yes  ? Types: "Crack" cocaine, Methamphetamines, Heroin  ? Comment: speed x 1 mo ago;  ?  ?Social History  ? ?Socioeconomic History  ? Marital status: Legally Separated  ?  Spouse name: Not on file  ? Number of children: Not on file  ? Years of education: Not on file  ? Highest education level: Not on file  ?Occupational History  ? Not on file  ?Tobacco Use  ? Smoking status: Every Day  ?  Packs/day: 0.50  ?  Types: Cigarettes  ? Smokeless tobacco: Never  ?Vaping Use  ? Vaping Use: Never used  ?Substance and Sexual Activity  ? Alcohol use: Yes  ?  Alcohol/week: 12.0 standard drinks  ?  Types: 12 Cans of beer per week  ?  Comment: heavy drinker  ? Drug use: Yes  ?  Types: "Crack" cocaine, Methamphetamines, Heroin  ?  Comment: speed x 1 mo ago;  ? Sexual activity:  Yes  ?Other Topics Concern  ? Not on file  ?Social History Narrative  ? Not on file  ? ?Social Determinants of Health  ? ?Financial Resource Strain: Not on file  ?Food Insecurity: Not on file  ?Transportation Needs: Not on file  ?Physical Activity: Not on file  ?Stress: Not on file  ?Social Connections: Not on file  ? ?SDOH:  ?SDOH Screenings  ? ?Alcohol Screen: Not on file  ?Depression (PHQ2-9): Medium Risk  ? PHQ-2 Score: 13  ?Financial Resource Strain: Not on file  ?Food Insecurity:  Not on file  ?Housing: Not on file  ?Physical Activity: Not on file  ?Social Connections: Not on file  ?Stress: Not on file  ?Tobacco Use: High Risk  ? Smoking Tobacco Use: Every Day  ? Smokeless Tobacco Use: Never  ? Passive Exposure: Not on file  ?Transportation Needs: Not on file  ? ? ?Tobacco Cessation:  Prescription not provided because: declined  ? ?Current Medications:  ?Current Facility-Administered Medications  ?Medication Dose Route Frequency Provider Last Rate Last Admin  ? acetaminophen (TYLENOL) tablet 650 mg  650 mg Oral Q6H PRN Sindy Guadeloupe, NP   650 mg at 06/13/21 1243  ? alum & mag hydroxide-simeth (MAALOX/MYLANTA) 200-200-20 MG/5ML suspension 30 mL  30 mL Oral Q4H PRN Sindy Guadeloupe, NP      ? DULoxetine (CYMBALTA) DR capsule 20 mg  20 mg Oral Daily Estella Husk, MD   20 mg at 06/13/21 0914  ? levothyroxine (SYNTHROID) tablet 25 mcg  25 mcg Oral Q0600 Estella Husk, MD   25 mcg at 06/13/21 0350  ? lidocaine (LIDODERM) 5 % 1 patch  1 patch Transdermal Q24H Estella Husk, MD   1 patch at 06/13/21 1104  ? magnesium hydroxide (MILK OF MAGNESIA) suspension 30 mL  30 mL Oral Daily PRN Sindy Guadeloupe, NP      ? multivitamin with minerals tablet 1 tablet  1 tablet Oral Daily Sindy Guadeloupe, NP   1 tablet at 06/13/21 0915  ? naproxen (NAPROSYN) tablet 500 mg  500 mg Oral BID PRN Sindy Guadeloupe, NP   500 mg at 06/11/21 0938  ? thiamine (B-1) injection 100 mg  100 mg Intramuscular Once Sindy Guadeloupe, NP      ? thiamine tablet 100 mg  100 mg Oral Daily Sindy Guadeloupe, NP   100 mg at 06/13/21 0915  ? ?Current Outpatient Medications  ?Medication Sig Dispense Refill  ? DULoxetine (CYMBALTA) 20 MG capsule Take 1 capsule (20 mg total) by mouth daily. 30 capsule 1  ? [START ON 06/14/2021] levothyroxine (SYNTHROID) 25 MCG tablet Take 1 tablet (25 mcg total) by mouth daily at 6 (six) AM. 30 tablet 1  ? lidocaine (LIDODERM) 5 % Place 1 patch onto the skin daily. Remove & Discard patch within 12 hours  or as directed by MD 30 patch 0  ? Multiple Vitamin (MULTIVITAMIN WITH MINERALS) TABS tablet Take 1 tablet by mouth daily. 30 tablet 1  ? thiamine 100 MG tablet Take 1 tablet (100 mg total) by mouth daily. 30 tablet 1  ? ? ?PTA Medications: (Not in a hospital admission) ? ? ?Musculoskeletal  ?Strength & Muscle Tone: within normal limits ?Gait & Station: normal ?Patient leans: N/A ? ?Psychiatric Specialty Exam  ?Presentation  ?General Appearance: Appropriate for Environment ? ?Eye Contact:Fair ? ?Speech:Clear and Coherent ? ?Speech Volume:Normal ? ?Handedness:Ambidextrous ? ? ?Mood and Affect  ?Mood:Anxious ? ?Affect:Congruent ? ? ?Thought Process  ?Thought Processes:Coherent; Goal Directed ? ?  Descriptions of Associations:Intact ? ?Orientation:Full (Time, Place and Person) ? ?Thought Content:WDL ?   ? Hallucinations:Hallucinations: None ? ?Ideas of Reference:None ? ?Suicidal Thoughts:Suicidal Thoughts: No ? ?Homicidal Thoughts:Homicidal Thoughts: No ? ? ?Sensorium  ?Memory:Immediate Fair; Remote Fair; Recent Poor ? ?Judgment:Fair ? ?Insight:Fair ? ? ?Executive Functions  ?Concentration:Fair ? ?Attention Span:Fair ? ?Recall:Fair ? ?Fund of Knowledge:Fair ? ?Language:Fair ? ? ?Psychomotor Activity  ?Psychomotor Activity:Psychomotor Activity: Normal ? ? ?Assets  ?Assets:Communication Skills; Desire for Improvement; Physical Health; Leisure Time ? ? ?Sleep  ?Sleep:Sleep: Fair ? ? ?No data recorded ? ?Physical Exam  ?Physical Exam ?HENT:  ?   Head: Normocephalic.  ?   Nose: Nose normal.  ?Eyes:  ?   Conjunctiva/sclera: Conjunctivae normal.  ?Cardiovascular:  ?   Rate and Rhythm: Normal rate.  ?Pulmonary:  ?   Effort: Pulmonary effort is normal.  ?Musculoskeletal:  ?   Cervical back: Normal range of motion.  ?   Comments: Lidocaine patch applied to back.   ?Neurological:  ?   Mental Status: She is alert and oriented to person, place, and time.  ? ?Review of Systems  ?Constitutional: Negative.   ?HENT: Negative.    ?Eyes:  Negative.   ?Respiratory: Negative.    ?Cardiovascular: Negative.   ?Gastrointestinal: Negative.   ?Genitourinary: Negative.   ?Musculoskeletal:  Positive for back pain.  ?Skin: Negative.   ?Neurological: Neg

## 2021-06-13 NOTE — ED Notes (Signed)
Patient is cooperative and interacts well with staff. Respiratory is even and unlabored. No distress noted. Patient is in the dayroom interacting with peers and watching TV at  present.  will continue to monitor for safety.  ?

## 2021-06-13 NOTE — ED Notes (Signed)
Pt was given 2 dinner boxs ?

## 2021-06-13 NOTE — ED Notes (Signed)
Patient awake and alert early this morning.  She ate breakfast and as attending to ADL's now.  Patient is calm and cooperative with care.  Patient denies avh shi or plan.  She is organized, logical and with out somatic distress.  Patient denies withdrawal symptoms.She is [facility]  Patient is able to make needs known to staff. Will monitor and provide safe environment.   ?

## 2021-06-13 NOTE — Discharge Instructions (Signed)
Discharge recommendations:  ?Patient is to take medications as prescribed. ?Go to [facility] on Monday, 06/14/21 for screening.  ? ?Medications: The patient is to contact a medical professional and/or outpatient provider to address any new side effects that develop. Patient should update outpatient providers of any new medications and/or medication changes.  ? ?Safety:  ?The patient should abstain from use of illicit substances/drugs and abuse of any medications. ?If symptoms worsen or do not continue to improve or if the patient becomes actively suicidal or homicidal then it is recommended that the patient return to the closest hospital emergency department, the [facility], or call 911 for further evaluation and treatment. ?National Suicide Prevention Lifeline 1-800-SUICIDE or (618)543-5364. ? ?About 988 ?988 offers 24/7 access to trained crisis counselors who can help people experiencing mental health-related distress. People can call or text 988 or chat 988lifeline.org for themselves or if they are worried about a loved one who may need crisis support.  ? ? ?  ?

## 2021-06-13 NOTE — ED Notes (Signed)
Pt is in the bed sleeping. Respirations are even and unlabored. No acute distress noted. Will continue to monitor for safety. 

## 2021-06-13 NOTE — ED Notes (Signed)
Pt came to nurse and complained about heart burn and requested for medication to help her sleep. ?

## 2021-06-14 DIAGNOSIS — F1910 Other psychoactive substance abuse, uncomplicated: Secondary | ICD-10-CM | POA: Diagnosis not present

## 2021-06-14 DIAGNOSIS — Z20822 Contact with and (suspected) exposure to covid-19: Secondary | ICD-10-CM | POA: Diagnosis not present

## 2021-06-14 DIAGNOSIS — F10129 Alcohol abuse with intoxication, unspecified: Secondary | ICD-10-CM | POA: Diagnosis not present

## 2021-06-14 NOTE — ED Notes (Signed)
Pt is in the bed sleeping. Respirations are even and unlabored. No acute distress noted. Will continue to monitor for safety. 

## 2021-06-14 NOTE — ED Notes (Signed)
Pt discharged in no acute distress. Denied SI/HI/AVH. All belongings returned from locker intact. Pt to be transported to [facility] via taxi service. Safety maintained. ?

## 2021-06-14 NOTE — ED Notes (Signed)
Patient A&Ox4. Denies intent to harm self/others when asked. Denies A/VH. Patient denies any physical complaints when asked. No acute distress noted. Routine safety checks conducted according to facility protocol. Encouraged patient to notify staff if thoughts of harm toward self or others arise. Patient verbalize understanding and agreement. Awaiting transport to [facility] this am. Will continue to monitor.  ?   ?

## 2021-07-02 ENCOUNTER — Telehealth (HOSPITAL_COMMUNITY): Payer: Self-pay

## 2021-07-02 NOTE — BH Assessment (Signed)
Care Management - [place] Follow Up Discharges   Writer attempted to make contact with patient today and was unsuccessful.  No voicemail, phone just rang.   Per chart review, patient is homeless.  Patient has been accepted to [facility] substance abuse on 06-14-2021.

## 2021-08-03 ENCOUNTER — Emergency Department (HOSPITAL_COMMUNITY)
Admission: EM | Admit: 2021-08-03 | Discharge: 2021-08-03 | Disposition: A | Discharge status: Home / Self Care | Payer: Self-pay | Attending: Emergency Medicine | Admitting: Emergency Medicine

## 2021-08-03 ENCOUNTER — Encounter (HOSPITAL_COMMUNITY): Payer: Self-pay

## 2021-08-03 ENCOUNTER — Emergency Department (HOSPITAL_COMMUNITY): Payer: Self-pay

## 2021-08-03 ENCOUNTER — Other Ambulatory Visit: Payer: Self-pay

## 2021-08-03 DIAGNOSIS — W25XXXA Contact with sharp glass, initial encounter: Secondary | ICD-10-CM | POA: Insufficient documentation

## 2021-08-03 DIAGNOSIS — S6992XA Unspecified injury of left wrist, hand and finger(s), initial encounter: Secondary | ICD-10-CM | POA: Insufficient documentation

## 2021-08-03 DIAGNOSIS — Z23 Encounter for immunization: Secondary | ICD-10-CM | POA: Insufficient documentation

## 2021-08-03 MED ORDER — TETANUS-DIPHTH-ACELL PERTUSSIS 5-2.5-18.5 LF-MCG/0.5 IM SUSY
0.5000 mL | PREFILLED_SYRINGE | Freq: Once | INTRAMUSCULAR | Status: AC
  Administered 2021-08-03: 0.5 mL via INTRAMUSCULAR
  Filled 2021-08-03: qty 0.5

## 2021-08-03 NOTE — Discharge Instructions (Signed)
We have updated your tetanus shot in the hospital today.  Your x-ray did not show any evidence of any retained glass.  Try to keep this area clean to prevent infection.  You can take ibuprofen and Tylenol for pain. If you start noticing any redness, swelling, or abnormal drainage from the area please return to the emergency department.

## 2021-08-03 NOTE — ED Triage Notes (Signed)
Pt c/o L thumb finger pain after picking up glass yesterday and cutting it. States her hand and forearm are numb and believes it is due to having glass stuck in finger.

## 2021-08-03 NOTE — ED Provider Notes (Signed)
[facility] EMERGENCY DEPARTMENT Provider Note   CSN: [id number] Arrival date & time: 08/03/21  1436     History PMH: MDD, poly substance abuse, ETOH use disorder Chief Complaint  Patient presents with   Finger Injury    Alexandra Floyd is a 56 y.o. female. Patient presents the ED with complaint of a left injury.  She says she cut herself on glass about 2 days ago.  She says she thinks she has glass stuck in her thumb as she is having pain radiating from her thumb up to midway up her arm.  She is concerned that she has an infection, as she states that about 12 years ago she had glass stuck in her finger and it resulted in a severe cellulitis.  She has no redness, or swelling of that extremity.  She denies any fevers or chills.  She denies any other symptoms.  HPI     Home Medications Prior to Admission medications   Medication Sig Start Date End Date Taking? Authorizing Provider  DULoxetine (CYMBALTA) 20 MG capsule Take 1 capsule (20 mg total) by mouth daily. 06/13/21   White, Patrice L, NP  levothyroxine (SYNTHROID) 25 MCG tablet Take 1 tablet (25 mcg total) by mouth daily at 6 (six) AM. 06/14/21   White, Patrice L, NP  lidocaine (LIDODERM) 5 % Place 1 patch onto the skin daily. Remove & Discard patch within 12 hours or as directed by MD 06/13/21   Layla Barter, NP  Multiple Vitamin (MULTIVITAMIN WITH MINERALS) TABS tablet Take 1 tablet by mouth daily. 06/13/21   White, Patrice L, NP  thiamine 100 MG tablet Take 1 tablet (100 mg total) by mouth daily. 06/13/21   White, Patrice L, NP      Allergies    Lasix [furosemide]    Review of Systems   Review of Systems  Constitutional:  Negative for chills and fever.  Skin:  Positive for wound. Negative for color change.  All other systems reviewed and are negative.   Physical Exam Updated Vital Signs BP 116/84 (BP Location: Right Arm)   Pulse 87   Temp 97.9 F (36.6 C) (Oral)   Resp 16   Ht 5\' 3"  (1.6 m)   Wt 72.6 kg   SpO2  99%   BMI 28.34 kg/m  Physical Exam Vitals and nursing note reviewed.  Constitutional:      General: She is not in acute distress.    Appearance: Normal appearance. She is well-developed. She is not ill-appearing, toxic-appearing or diaphoretic.  HENT:     Head: Normocephalic and atraumatic.     Nose: No nasal deformity.     Mouth/Throat:     Lips: Pink. No lesions.  Eyes:     General: Gaze aligned appropriately. No scleral icterus.       Right eye: No discharge.        Left eye: No discharge.     Conjunctiva/sclera: Conjunctivae normal.     Right eye: Right conjunctiva is not injected. No exudate or hemorrhage.    Left eye: Left conjunctiva is not injected. No exudate or hemorrhage. Pulmonary:     Effort: Pulmonary effort is normal. No respiratory distress.  Skin:    General: Skin is warm and dry.     Comments: She has a small 3 mm linear cut on her left thumb.  There is no surrounding erythema or abnormal drainage.  I cannot palpate anything hard underneath skin.  She has a 2+ radial pulse  as well as normal sensation.  Neurological:     Mental Status: She is alert and oriented to person, place, and time.  Psychiatric:        Mood and Affect: Mood normal.        Speech: Speech normal.        Behavior: Behavior normal. Behavior is cooperative.     ED Results / Procedures / Treatments   Labs (all labs ordered are listed, but only abnormal results are displayed) Labs Reviewed - No data to display  EKG None  Radiology DG Hand Complete Left  Result Date: 08/03/2021 CLINICAL DATA:  foreign body. Pt c/o L thumb pain after picking up glass yesterday and cutting it. States her Lt hand and Lt forearm are numb and believes it is due to having glass stuck in Lt thumb. EXAM: LEFT HAND - COMPLETE 3+ VIEW COMPARISON:  None Available. FINDINGS: There is no evidence of fracture or dislocation. Likely old healed distal ulnar and radial fractures. First carpometacarpal joint degenerative  changes. Metacarpal head osteophyte formation. Soft tissues are unremarkable. No retained radiopaque foreign body. IMPRESSION: 1. No acute displaced fracture or dislocation. 2. No retained radiopaque foreign body. Electronically Signed   By: Tish Frederickson M.D.   On: 08/03/2021 15:14    Procedures Procedures   Medications Ordered in ED Medications  Tdap (BOOSTRIX) injection 0.5 mL (0.5 mLs Intramuscular Given 08/03/21 1532)    ED Course/ Medical Decision Making/ A&P                           Medical Decision Making Amount and/or Complexity of Data Reviewed Radiology: ordered.  Risk Prescription drug management.   Patient presents with concern for foreign body (glass) in her left thumb. She is hemodynamically stable with normal vitals.  She appears well. Exam reveals a small superficial cut on the left thumb there is no signs of infection such as erythema, swelling, or abnormal drainage.  The hand and arm itself is completely normal. She is neurovascularly intact. XRAY of hand obtained to evaluate for fb X-ray shows no evidence of any fracture or foreign body. I have low suspicion for occult foreign body.  There are no signs of infection on exam.  Patient is hemodynamically stable.  We have updated her tetanus shot here.  I think patient is stable for discharge.  I have given her return precautions including signs of infection around the cut.  Final Clinical Impression(s) / ED Diagnoses Final diagnoses:  Injury of finger of left hand, initial encounter    Rx / DC Orders ED Discharge Orders     None         Claudie Leach, PA-C 08/03/21 1543    Cathren Laine, MD 08/03/21 1759

## 2021-08-10 ENCOUNTER — Ambulatory Visit: Payer: Self-pay | Admitting: Physician Assistant

## 2021-08-10 ENCOUNTER — Encounter: Payer: Self-pay | Admitting: Physician Assistant

## 2021-08-10 ENCOUNTER — Telehealth: Payer: Self-pay

## 2021-08-10 VITALS — BP 120/80 | HR 85 | Temp 97.3°F | Ht 63.0 in | Wt 168.0 lb

## 2021-08-10 DIAGNOSIS — K219 Gastro-esophageal reflux disease without esophagitis: Secondary | ICD-10-CM

## 2021-08-10 DIAGNOSIS — R079 Chest pain, unspecified: Secondary | ICD-10-CM

## 2021-08-10 DIAGNOSIS — E039 Hypothyroidism, unspecified: Secondary | ICD-10-CM

## 2021-08-10 DIAGNOSIS — F17200 Nicotine dependence, unspecified, uncomplicated: Secondary | ICD-10-CM

## 2021-08-10 DIAGNOSIS — F1910 Other psychoactive substance abuse, uncomplicated: Secondary | ICD-10-CM

## 2021-08-10 MED ORDER — LEVOTHYROXINE SODIUM 25 MCG PO TABS: 25.0000 ug | ORAL_TABLET | Freq: Every day | ORAL | 0 refills | Status: DC

## 2021-08-10 MED ORDER — OMEPRAZOLE 40 MG PO CPDR: 40.0000 mg | DELAYED_RELEASE_CAPSULE | Freq: Every day | ORAL | 0 refills | Status: DC

## 2021-08-10 NOTE — Progress Notes (Signed)
BP 120/80   Pulse 85   Temp (!) 97.3 F (36.3 C)   Ht 5\' 3"  (1.6 m)   Wt 168 lb (76.2 kg)   SpO2 98%   BMI 29.76 kg/m    Subjective:    Patient ID: , female    DOB: [birth year], 56 y.o.   MRN: [id number]  HPI: Alexandra Floyd is a 56 y.o. female presenting on 08/10/2021 for Follow-up (Pt is out of all her medications.) and Mass (Pt states she has a knot on pelvic area which moves around. Pt states sometimes it hurts. Pt states it's getting bigger and has been there for about 2 years)   HPI  Chief Complaint  Patient presents with   Follow-up    Pt is out of all her medications.   Mass    Pt states she has a knot on pelvic area which moves around. Pt states sometimes it hurts. Pt states it's getting bigger and has been there for about 2 years    Pt has Not been seen since septmeber.  She Has had two no-shows since that time.  She was seen in ER several times since the- substance abuse (Alcohol, cocaine, methamphetamine, heroin.)  She is going to [facility] for [facility] and substance abuse help but says she missed her last appt and hasn't rescheduled yet.  She says out of all her meds for "a couple months"  She says she Relapsed with drugs- she says her last use was about a month ago.  Her boyfriend has cancer; he goes to the [facility] in [place].  He missed his last appt.   They took out his larynx and has a stoma to talk.   The boyfriends 2  adult sons live with them as well.  None of the 4 work.  Pt says this is very stressful and she says it's difficult being together 24 x 7.    She takes a lot of nsaids for general aches and pain.  She was on ppi in the past when she was last here.  She says she hasn't been on that in a long time.          Relevant past medical, surgical, family and social history reviewed and updated as indicated. Interim medical history since our last visit reviewed. Allergies and medications reviewed and updated.  CURRENT  MEDS: none  Review of Systems  Per HPI unless specifically indicated above     Objective:    BP 120/80   Pulse 85   Temp (!) 97.3 F (36.3 C)   Ht 5\' 3"  (1.6 m)   Wt 168 lb (76.2 kg)   SpO2 98%   BMI 29.76 kg/m   Wt Readings from Last 3 Encounters:  08/10/21 168 lb (76.2 kg)  08/03/21 160 lb (72.6 kg)  05/22/21 166 lb 6.4 oz (75.5 kg)    Physical Exam Vitals reviewed.  Constitutional:      Appearance: She is well-developed.  HENT:     Head: Normocephalic and atraumatic.  Cardiovascular:     Rate and Rhythm: Normal rate and regular rhythm.  Pulmonary:     Effort: Pulmonary effort is normal.     Breath sounds: Normal breath sounds.  Abdominal:     General: Bowel sounds are normal.     Palpations: Abdomen is soft. There is no mass.     Tenderness: There is no abdominal tenderness.  Musculoskeletal:     Cervical back: Neck supple.  Lymphadenopathy:  Cervical: No cervical adenopathy.  Skin:    General: Skin is warm and dry.  Neurological:     Mental Status: She is alert and oriented to person, place, and time.  Psychiatric:        Behavior: Behavior normal.     EKG- nsr.  No changes compared with EKG done 06/10/21      Assessment & Plan:   Encounter Diagnoses  Name Primary?   Gastroesophageal reflux disease, unspecified whether esophagitis present Yes   Hypothyroidism, unspecified type    Chest pain, unspecified type    Tobacco use disorder    Polysubstance abuse (HCC)      -Labs done 2 months ago-     -4.5% 10-yr risk- ascvd acc risk -Restart omeprazole and levothyroxine.  Pt counseled to avoid nsaids.  -Gastritis likely cause of her cp.  Discussed could also be some anxiety. -Pt urged to return to [facility] -pt will RTO next week for evaluation pelvic mass

## 2021-08-10 NOTE — Telephone Encounter (Signed)
Late entry for 08/09/21  Attempted to reach for [facility] post ER follow up. No answer. Left message. Note client has an appointment with [facility] 08/10/21.   Francee Nodal RN [employer]

## 2021-08-17 ENCOUNTER — Ambulatory Visit: Payer: Self-pay | Admitting: Physician Assistant

## 2021-08-17 ENCOUNTER — Encounter: Payer: Self-pay | Admitting: Physician Assistant

## 2021-08-17 VITALS — BP 130/90 | HR 73 | Temp 97.6°F | Ht 63.0 in | Wt 168.2 lb

## 2021-08-17 DIAGNOSIS — Z711 Person with feared health complaint in whom no diagnosis is made: Secondary | ICD-10-CM

## 2021-08-17 DIAGNOSIS — Z5320 Procedure and treatment not carried out because of patient's decision for unspecified reasons: Secondary | ICD-10-CM

## 2021-08-17 NOTE — Progress Notes (Signed)
   BP 130/90   Pulse 73   Temp 97.6 F (36.4 C)   Ht 5\' 3"  (1.6 m)   Wt 168 lb 3.2 oz (76.3 kg)   SpO2 98%   BMI 29.80 kg/m    Subjective:    Patient ID: , female    DOB: 09-17-1965, 56 y.o.   MRN: [id number]  HPI: Alexandra Floyd is a 56 y.o. female presenting on 08/17/2021 for Pelvic Pain   HPI  Pt c/o Lump in groin area present for a couple of years.  She says it sometimes you can feel it and sometimes you can't- "it moves around".  She says It's getting bigger.   She is starting a new joy at 08/19/2021 out soon- [employer]  medical after 30 day.   Starts tomorrow-   one in [place] opens 3-4 weeks.    She says she had Pap last year then says   she doesn't remember.  Pt declines pap today- just wants to look at the bump.  She never return her FIT test for colon cancer screening.      Relevant past medical, surgical, family and social history reviewed and updated as indicated. Interim medical history since our last visit reviewed. Allergies and medications reviewed and updated.  Review of Systems  Per HPI unless specifically indicated above     Objective:    BP 130/90   Pulse 73   Temp 97.6 F (36.4 C)   Ht 5\' 3"  (1.6 m)   Wt 168 lb 3.2 oz (76.3 kg)   SpO2 98%   BMI 29.80 kg/m   Wt Readings from Last 3 Encounters:  08/17/21 168 lb 3.2 oz (76.3 kg)  08/10/21 168 lb (76.2 kg)  08/03/21 160 lb (72.6 kg)    Physical Exam Exam conducted with a chaperone present.  Constitutional:      General: She is not in acute distress.    Appearance: She is not ill-appearing or toxic-appearing.  HENT:     Head: Normocephalic and atraumatic.  Pulmonary:     Effort: Pulmonary effort is normal. No respiratory distress.  Genitourinary:    Exam position: Supine.     Comments: (Nurse Berenice assisted)  No palpable mass or bump mons, pubic area, groin.  No rash or lesion seen.  Pt too is unable to palpate the mass- says she can't find it  today. Lymphadenopathy:     Lower Body: No right inguinal adenopathy. No left inguinal adenopathy.  Skin:    General: Skin is warm and dry.  Neurological:     Mental Status: She is alert and oriented to person, place, and time.  Psychiatric:        Mood and Affect: Mood normal.        Behavior: Behavior normal.           Assessment & Plan:   Encounter Diagnoses  Name Primary?   No problem, feared complaint unfounded Yes   Pap smear of cervix declined       Pt has appointment scheduled for august.  Discussed her getting new PCP if she gets insurance (will no longer meet eligibility).  Pt to RTO sooner if her lump re-appears or for other problem

## 2021-08-20 ENCOUNTER — Encounter (HOSPITAL_COMMUNITY): Payer: Self-pay | Admitting: Emergency Medicine

## 2021-08-20 ENCOUNTER — Emergency Department (HOSPITAL_COMMUNITY): Payer: Self-pay

## 2021-08-20 ENCOUNTER — Emergency Department (HOSPITAL_COMMUNITY)
Admission: EM | Admit: 2021-08-20 | Discharge: 2021-08-20 | Disposition: A | Discharge status: Home / Self Care | Payer: Self-pay | Attending: Emergency Medicine | Admitting: Emergency Medicine

## 2021-08-20 DIAGNOSIS — K5792 Diverticulitis of intestine, part unspecified, without perforation or abscess without bleeding: Secondary | ICD-10-CM | POA: Insufficient documentation

## 2021-08-20 LAB — COMPREHENSIVE METABOLIC PANEL
ALT: 51 U/L — ABNORMAL HIGH (ref 0–44)
AST: 37 U/L (ref 15–41)
Albumin: 4.1 g/dL (ref 3.5–5.0)
Alkaline Phosphatase: 95 U/L (ref 38–126)
Anion gap: 8 (ref 5–15)
BUN: 9 mg/dL (ref 6–20)
CO2: 21 mmol/L — ABNORMAL LOW (ref 22–32)
Calcium: 9.3 mg/dL (ref 8.9–10.3)
Chloride: 108 mmol/L (ref 98–111)
Creatinine, Ser: 0.81 mg/dL (ref 0.44–1.00)
GFR, Estimated: >60 mL/min (ref >60)
Glucose, Bld: 88 mg/dL (ref 70–99)
Potassium: 3.9 mmol/L (ref 3.5–5.1)
Sodium: 137 mmol/L (ref 135–145)
Total Bilirubin: 0.9 mg/dL (ref 0.3–1.2)
Total Protein: 8.4 g/dL — ABNORMAL HIGH (ref 6.5–8.1)

## 2021-08-20 LAB — URINALYSIS, ROUTINE W REFLEX MICROSCOPIC
Bacteria, UA: NONE SEEN
Bilirubin Urine: NEGATIVE
Glucose, UA: NEGATIVE mg/dL
Ketones, ur: NEGATIVE mg/dL
Leukocytes,Ua: NEGATIVE
Nitrite: NEGATIVE
Protein, ur: NEGATIVE mg/dL
Specific Gravity, Urine: >1.046 — ABNORMAL HIGH (ref 1.005–1.030)
pH: 5 (ref 5.0–8.0)

## 2021-08-20 LAB — CBC WITH DIFFERENTIAL/PLATELET
Abs Immature Granulocytes: 0.02 10*3/uL (ref 0.00–0.07)
Basophils Absolute: 0.1 10*3/uL (ref 0.0–0.1)
Basophils Relative: 1 %
Eosinophils Absolute: 0.1 10*3/uL (ref 0.0–0.5)
Eosinophils Relative: 2 %
HCT: 41 % (ref 36.0–46.0)
Hemoglobin: 13.8 g/dL (ref 12.0–15.0)
Immature Granulocytes: 0 %
Lymphocytes Relative: 28 %
Lymphs Abs: 1.8 10*3/uL (ref 0.7–4.0)
MCH: 31.3 pg (ref 26.0–34.0)
MCHC: 33.7 g/dL (ref 30.0–36.0)
MCV: 93 fL (ref 80.0–100.0)
Monocytes Absolute: 0.5 10*3/uL (ref 0.1–1.0)
Monocytes Relative: 8 %
Neutro Abs: 3.8 10*3/uL (ref 1.7–7.7)
Neutrophils Relative %: 61 %
Platelets: 282 10*3/uL (ref 150–400)
RBC: 4.41 MIL/uL (ref 3.87–5.11)
RDW: 13 % (ref 11.5–15.5)
WBC: 6.3 10*3/uL (ref 4.0–10.5)
nRBC: 0 % (ref 0.0–0.2)

## 2021-08-20 LAB — LIPASE, BLOOD: Lipase: 29 U/L (ref 11–51)

## 2021-08-20 MED ORDER — AMOXICILLIN-POT CLAVULANATE 875-125 MG PO TABS
1.0000 | ORAL_TABLET | Freq: Once | ORAL | Status: AC
  Administered 2021-08-20: 1 via ORAL
  Filled 2021-08-20: qty 1

## 2021-08-20 MED ORDER — HYDROCODONE-ACETAMINOPHEN 5-325 MG PO TABS: ORAL_TABLET | ORAL | 0 refills | Status: DC

## 2021-08-20 MED ORDER — IOHEXOL 300 MG/ML  SOLN
100.0000 mL | Freq: Once | INTRAMUSCULAR | Status: AC | PRN
  Administered 2021-08-20: 100 mL via INTRAVENOUS

## 2021-08-20 MED ORDER — MORPHINE SULFATE (PF) 4 MG/ML IV SOLN
4.0000 mg | Freq: Once | INTRAVENOUS | Status: AC
  Administered 2021-08-20: 4 mg via INTRAVENOUS
  Filled 2021-08-20: qty 1

## 2021-08-20 MED ORDER — ONDANSETRON HCL 4 MG/2ML IJ SOLN
4.0000 mg | Freq: Once | INTRAMUSCULAR | Status: AC
  Administered 2021-08-20: 4 mg via INTRAVENOUS
  Filled 2021-08-20: qty 2

## 2021-08-20 MED ORDER — AMOXICILLIN-POT CLAVULANATE 875-125 MG PO TABS: 1.0000 | ORAL_TABLET | Freq: Two times a day (BID) | ORAL | 0 refills | Status: DC

## 2021-08-20 NOTE — ED Notes (Signed)
2nd time pt has went to bathroom and forgot urine sample

## 2021-08-20 NOTE — ED Triage Notes (Signed)
Abd pain and diarrhea x 5 days

## 2021-08-20 NOTE — Discharge Instructions (Addendum)
The CT scan of your abdomen today shows that you have likely diverticulitis.  This is treated with antibiotics.  It is important that you take the antibiotic as directed until its finished.  Please call the GI doctor listed to arrange a follow-up appointment as you will likely need a colonoscopy since you have not had one.  Return to the emergency department for any new or worsening symptoms

## 2021-08-23 NOTE — ED Provider Notes (Signed)
[facility] Provider Note   CSN: [id number] Arrival date & time: 08/20/21  1228     History  Chief Complaint  Patient presents with   Abdominal Pain    Alexandra Floyd is a 56 y.o. female.   Abdominal Pain Associated symptoms: diarrhea   Associated symptoms: no chest pain, no chills, no dysuria, no fever, no nausea, no shortness of breath and no vomiting         Alexandra Floyd is a 56 y.o. female with past medical history of anxiety, polysubstance abuse, and hepatitis C who presents to the Emergency Department complaining of diffuse abdominal pain x5 days.  She states pain has been associated with multiple bouts of loose to watery stools.  States her stools have been nonbloody or black.  At times, she states the pain feels as though it radiates all the way around her abdomen and back.  Nothing makes her symptoms better or worse.  She denies any nausea or vomiting, fever or chills.  Chest pain or shortness of breath.  No dysuria symptoms.  No generalized weakness or syncope.  She denies any recent antibiotic use   Home Medications Prior to Admission medications   Medication Sig Start Date End Date Taking? Authorizing Provider  amoxicillin-clavulanate (AUGMENTIN) 875-125 MG tablet Take 1 tablet by mouth every 12 (twelve) hours. 08/20/21  Yes Atleigh Gruen, PA-C  HYDROcodone-acetaminophen (NORCO/VICODIN) 5-325 MG tablet Take one tab po q 4 hrs prn pain 08/20/21  Yes Navina Wohlers, PA-C  levothyroxine (SYNTHROID) 25 MCG tablet Take 1 tablet (25 mcg total) by mouth daily at 6 (six) AM. 08/10/21  Yes Soyla Dryer, PA-C  omeprazole (PRILOSEC) 40 MG capsule Take 1 capsule (40 mg total) by mouth daily. 08/10/21  Yes Soyla Dryer, PA-C      Allergies    Lasix [furosemide]    Review of Systems   Review of Systems  Constitutional:  Negative for appetite change, chills and fever.  Respiratory:  Negative for chest tightness and shortness of breath.    Cardiovascular:  Negative for chest pain.  Gastrointestinal:  Positive for abdominal pain and diarrhea. Negative for abdominal distention, nausea and vomiting.  Genitourinary:  Negative for difficulty urinating, dysuria and flank pain.  Skin:  Negative for color change and rash.  Neurological:  Negative for dizziness and weakness.    Physical Exam Updated Vital Signs BP 117/73   Pulse 89   Temp 98.1 F (36.7 C) (Oral)   Resp 18   SpO2 98%  Physical Exam Vitals and nursing note reviewed.  Constitutional:      General: She is not in acute distress.    Appearance: She is well-developed. She is not toxic-appearing.     Comments: Patient somewhat anxious appearing  HENT:     Mouth/Throat:     Mouth: Mucous membranes are moist.  Cardiovascular:     Rate and Rhythm: Normal rate and regular rhythm.     Pulses: Normal pulses.  Pulmonary:     Effort: Pulmonary effort is normal.     Breath sounds: Normal breath sounds.  Chest:     Chest wall: No tenderness.  Abdominal:     General: There is no distension.     Palpations: Abdomen is soft. There is no mass.     Tenderness: There is abdominal tenderness. There is no right CVA tenderness, left CVA tenderness or guarding.     Comments: Mild tenderness to palpation throughout the mid to lower abdomen.  Guarding or  rebound.  Abdomen is soft bowel sounds x4  Skin:    General: Skin is warm.     Capillary Refill: Capillary refill takes less than 2 seconds.     Findings: No rash.  Neurological:     General: No focal deficit present.     Mental Status: She is alert.     Sensory: No sensory deficit.     Motor: No weakness.     ED Results / Procedures / Treatments   Labs (all labs ordered are listed, but only abnormal results are displayed) Labs Reviewed  COMPREHENSIVE METABOLIC PANEL - Abnormal; Notable for the following components:      Result Value   CO2 21 (*)    Total Protein 8.4 (*)    ALT 51 (*)    All other components  within normal limits  URINALYSIS, ROUTINE W REFLEX MICROSCOPIC - Abnormal; Notable for the following components:   Color, Urine STRAW (*)    Specific Gravity, Urine >1.046 (*)    Hgb urine dipstick MODERATE (*)    All other components within normal limits  CBC WITH DIFFERENTIAL/PLATELET  LIPASE, BLOOD    EKG None  Radiology CT ABDOMEN PELVIS W CONTRAST  Result Date: 08/20/2021 CLINICAL DATA:  Left lower quadrant abdominal pain. EXAM: CT ABDOMEN AND PELVIS WITH CONTRAST TECHNIQUE: Multidetector CT imaging of the abdomen and pelvis was performed using the standard protocol following bolus administration of intravenous contrast. RADIATION DOSE REDUCTION: This exam was performed according to the departmental dose-optimization program which includes automated exposure control, adjustment of the mA and/or kV according to patient size and/or use of iterative reconstruction technique. CONTRAST:  OMNIPAQUE IOHEXOL 300 MG/ML  SOLN COMPARISON:  CT examination dated October 28, 2020 FINDINGS: Lower chest: No acute abnormality. Hepatobiliary: No focal liver abnormality is seen. Status post cholecystectomy. Prominence of the extrahepatic common duct, likely secondary to cholecystectomy status. Pancreas: Unremarkable. No pancreatic ductal dilatation or surrounding inflammatory changes. Spleen: Normal in size without focal abnormality. Adrenals/Urinary Tract: Adrenal glands are unremarkable. Kidneys are normal, without renal calculi, focal lesion, or hydronephrosis. Bladder is unremarkable. Stomach/Bowel: Stomach is within normal limits. Appendix appears normal. Colonic diverticulosis. There is mild fat stranding in the sigmoid colon without appreciable colonic wall thickening. No extraluminal free air. No abdominal collection. Vascular/Lymphatic: No significant vascular findings are present. No enlarged abdominal or pelvic lymph nodes. Reproductive: Uterus and bilateral adnexa are unremarkable. Other: No  abdominal wall hernia or abnormality. No abdominopelvic ascites. Musculoskeletal: Mild degenerate disc disease of the lumbar spine prominent at L4-L5 and L5-S1 with associated facet joint arthropathy. IMPRESSION: 1. Colonic diverticulosis. Mild fat stranding in the left lower quadrant adjacent to the sigmoid colon without appreciable colonic wall thickening. The findings may suggest mild acute diverticulitis. No adjacent fluid collection or extraluminal free air. 2.  Normal appendix. 3.  No evidence of nephrolithiasis or hydronephrosis. 4. Status post cholecystectomy with prominent extra hepatic common duct, likely secondary to cholecystectomy status. 5.  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 Electronically Signed   By: Larose Hires D.O.   On: 08/20/2021 17:35   Procedures Procedures    Medications Ordered in ED Medications  morphine (PF) 4 MG/ML injection 4 mg (4 mg Intravenous Given 08/20/21 1605)  ondansetron (ZOFRAN) injection 4 mg (4 mg Intravenous Given 08/20/21 1604)  iohexol (OMNIPAQUE) 300 MG/ML solution 100 mL (100 mLs Intravenous Contrast Given 08/20/21 1704)  amoxicillin-clavulanate (AUGMENTIN) 875-125 MG per tablet 1 tablet (1 tablet Oral Given 08/20/21  [email or web address])    ED Course/ Medical Decision Making/ A&P                           Medical Decision Making Patient here for evaluation of 5-day history of diffuse abdominal pain and loose to watery stools.  No melena or hematochezia.  On exam, patient is anxious appearing but nontoxic.  Vital signs reassuring.  Mucous membranes are moist and she has diffuse abdominal pain on exam throughout mid to lower abdomen.  There is no guarding and no localizing tenderness of the right lower quadrant.  Differential diagnosis of patient's abdominal pain is unclear at this time but would include acute appendicitis, acute diverticulitis, mesenteric ischemia bowel obstruction.  I feel that acute appendicitis and mesenteric ischemia are  less likely given lack of specific right lower quadrant pain and persistent symptoms for nearly 1 week and without presence of nausea vomiting or fever  Amount and/or Complexity of Data Reviewed Labs: ordered.    Details: Labs interpreted by me without evidence of leukocytosis, hemoglobin unremarkable.  Chemistries without significant derangement.  No significant elevated liver enzymes and lipase is unremarkable.  Urinalysis shows moderate blood without evidence of infection. Radiology: ordered.    Details: CT abdomen and pelvis showed colonic diverticulosis with fat stranding left lower quadrant.  findings may suggest acute diverticulitis Discussion of management or test interpretation with external provider(s): Work-up suggestive of acute diverticulitis.  No good radiographic evidence of abscess or perforation.  On recheck, patient reports feeling better.  Vital signs are reassuring.  I feel that she is appropriate for outpatient management with Augmentin.  No history of prior colonoscopy so will recommend close follow-up with GI.  She is agreeable to this plan.  Return precautions were discussed  Risk Prescription drug management.           Final Clinical Impression(s) / ED Diagnoses Final diagnoses:  Acute diverticulitis    Rx / DC Orders ED Discharge Orders          Ordered    amoxicillin-clavulanate (AUGMENTIN) 875-125 MG tablet  Every 12 hours        08/20/21 1808    HYDROcodone-acetaminophen (NORCO/VICODIN) 5-325 MG tablet        08/20/21 1808              Pauline Aus, PA-C 08/23/21 1555    Franne Forts, DO 08/23/21 2351

## 2021-10-04 ENCOUNTER — Other Ambulatory Visit: Payer: Self-pay

## 2021-10-04 ENCOUNTER — Emergency Department (HOSPITAL_COMMUNITY)
Admission: EM | Admit: 2021-10-04 | Discharge: 2021-10-04 | Disposition: A | Discharge status: Home / Self Care | Payer: Self-pay | Attending: Emergency Medicine | Admitting: Emergency Medicine

## 2021-10-04 ENCOUNTER — Encounter (HOSPITAL_COMMUNITY): Payer: Self-pay

## 2021-10-04 ENCOUNTER — Emergency Department (HOSPITAL_COMMUNITY): Payer: Self-pay

## 2021-10-04 DIAGNOSIS — L03213 Periorbital cellulitis: Secondary | ICD-10-CM | POA: Insufficient documentation

## 2021-10-04 DIAGNOSIS — R739 Hyperglycemia, unspecified: Secondary | ICD-10-CM | POA: Insufficient documentation

## 2021-10-04 LAB — CBC
HCT: 42.4 % (ref 36.0–46.0)
Hemoglobin: 14.2 g/dL (ref 12.0–15.0)
MCH: 31.6 pg (ref 26.0–34.0)
MCHC: 33.5 g/dL (ref 30.0–36.0)
MCV: 94.4 fL (ref 80.0–100.0)
Platelets: 282 10*3/uL (ref 150–400)
RBC: 4.49 MIL/uL (ref 3.87–5.11)
RDW: 13.1 % (ref 11.5–15.5)
WBC: 5.1 10*3/uL (ref 4.0–10.5)
nRBC: 0 % (ref 0.0–0.2)

## 2021-10-04 LAB — BASIC METABOLIC PANEL
Anion gap: 7 (ref 5–15)
BUN: 12 mg/dL (ref 6–20)
CO2: 27 mmol/L (ref 22–32)
Calcium: 9.4 mg/dL (ref 8.9–10.3)
Chloride: 103 mmol/L (ref 98–111)
Creatinine, Ser: 0.73 mg/dL (ref 0.44–1.00)
GFR, Estimated: >60 mL/min (ref >60)
Glucose, Bld: 88 mg/dL (ref 70–99)
Potassium: 3.7 mmol/L (ref 3.5–5.1)
Sodium: 137 mmol/L (ref 135–145)

## 2021-10-04 LAB — CBG MONITORING, ED: Glucose-Capillary: 110 mg/dL — ABNORMAL HIGH (ref 70–99)

## 2021-10-04 MED ORDER — AMOXICILLIN-POT CLAVULANATE 875-125 MG PO TABS: 1.0000 | ORAL_TABLET | Freq: Two times a day (BID) | ORAL | 0 refills | Status: DC

## 2021-10-04 MED ORDER — CLINDAMYCIN HCL 150 MG PO CAPS
300.0000 mg | ORAL_CAPSULE | Freq: Once | ORAL | Status: AC
  Administered 2021-10-04: 300 mg via ORAL
  Filled 2021-10-04: qty 2

## 2021-10-04 MED ORDER — CLINDAMYCIN HCL 300 MG PO CAPS: 300.0000 mg | ORAL_CAPSULE | Freq: Three times a day (TID) | ORAL | 0 refills | Status: AC

## 2021-10-04 MED ORDER — AMOXICILLIN-POT CLAVULANATE 875-125 MG PO TABS
1.0000 | ORAL_TABLET | Freq: Once | ORAL | Status: AC
Start: 2021-10-04 — End: 2021-10-04
  Administered 2021-10-04: 1 via ORAL
  Filled 2021-10-04: qty 1

## 2021-10-04 NOTE — Discharge Instructions (Addendum)
Prescriptions for 2 antibiotics have been provided for you.  It is important that you take these medications.  They are as follows: Clindamycin-take 1 capsule every 8 hours (3 times per day) for the next 7 days Augmentin-take 1 tablet every 12 hours (2 times per day) for the next 7 days  Continue to manage pain and inflammation with Tylenol and ibuprofen as needed.  By law, you need to keep your dog quarantined for the next 10 to 14 days to evaluate for possible changes that may suggest rabies infection.  If she does, then you must return to the ED for rabies vaccination series soon as possible.  Recommend close follow-up with your PCP in the next 3 to 4 days for reevaluation continue medical management.  Return to the ED for any new or worsening symptoms as discussed.  If you do not have a doctor see the list below.  RESOURCE GUIDE  Chronic Pain Problems: Contact [facility]  [phone] Patients need to be referred by their primary care doctor.  Insufficient Money for Medicine: Contact United Way:  call "211" or [facility] [phone].  No Primary Care Doctor: Call Health Connect  [phone] - can help you locate a primary care doctor that  accepts your insurance, provides certain services, etc. Physician Referral Service- [phone] Agencies that provide inexpensive medical care: [facility]  [phone] [facility] Internal Medicine  [phone] [facility]  [phone] [facility]  [phone] Planned [facility]  [phone] [facility]  [phone]  Medicaid-accepting [facility] Providers: [facility]- [address]  [phone], Mon-Fri 9am-7pm, Sat 9am-1pm [facility]- [address] [place], [address] [place]  [phone] [facility]- [address], [address] [place]  [phone] [facility]- [address]  [phone] Renaye Rakers- [address] [place], [address], [phone]  Only accepts [place] patients after they have their name  applied to their card  Self Pay (no insurance) in [facility]: Sickle Cell Patients: Dr Willey Blade, [facility] Internal Medicine  [address] [place], [phone] [facility]gent Care- [address] [place]  [phone]       [facility]- [address]       -     [facility]- see information above (Speak to [employer] if you do not have insurance)       -  [facility]- [address] [place], [phone]       -  [facility]- [address],  [phone]       -  [facility]- [address] [phone]       -  Dr Julio Sicks-  [address], [place], [phone]       -  [facility]- [address] [phone]       -  [facility]- [address], [phone], also [address] [phone]       -    [facility]- [address] [place], [phone], 1st & 3rd Saturday   every month, 10am-1pm  1) Find a Doctor and Pay Out of Pocket Although you won't have to find out who is covered by your insurance plan, it is a good idea to ask around and get recommendations.  You will then need to call the office and see if the doctor you have chosen will accept you as a new patient and what types of options they offer for patients who are self-pay. Some doctors offer discounts or will set up payment plans for their patients who do not have insurance, but you will need to ask so you aren't surprised when you get to your appointment.  2) Contact Your Local Health Department Not all health departments have doctors that can see patients for sick visits, but many do, so it is worth a call to see if yours does. If you don't know where your local health department is, you can check in your phone book. The CDC also has a tool to help you locate your state's health department, and many  state websites also have listings of all of their local health departments.  3) Find a Walk-in Clinic If your illness is not likely to be very severe or complicated, you may want to try a walk in clinic. These are popping up all over the country in pharmacies, drugstores, and shopping centers. They're usually staffed by nurse practitioners or physician assistants that have been trained to treat common illnesses and complaints. They're usually fairly quick and inexpensive. However, if you have serious medical issues or chronic medical problems, these are probably not your best option  STD Testing [facility] Department of [facility] [place], STD Clinic, [address], [place], phone [phone] or [phone].  Monday - Friday, call for an appointment. [facility] Department of [employer], STD Clinic, [place] [address], [place], phone [phone] or [phone].  Monday - Friday, call for an appointment.  Abuse/Neglect: [facility] Child Abuse Hotline ([phone] [phone] [facility] Child Abuse Hotline 6066725135 (After Hours)  Emergency Shelter:  [facility] 517-573-8243  Maternity Homes: Room at the [place] [facility] [phone]) [phone] [facility] [phone]  MRSA Hotline #:   520-519-8632  [facility] Resources  [facility] [place]     United Way                          [facility] Dept. [address] [place]                       [address]      [address]  [place]ell Reveal Phone:  [phone]                                   Phone:  [phone]                 Phone:  [phone]  [facility], [phone] [phone]       -     [facility] in [place], [address]  [phone], [place] [phone] or [phone] (After Hours)  [place]  Substance Abuse Resources: Alcohol and Drug Services  [place] 806-281-3698 [facility] [place] [facility]a Pester 445-069-4514 Residential & Outpatient Substance Abuse Program  ([phone]  Psychological Services: [place]  [phone] [place], [place] [address], [place], [place]: [phone] or [phone], [employer]  Dental Assistance  Patients with Medicaid: [place] 365-518-1456 [address] Phone:  [phone]                                                  Phone:  [phone]  If unable to pay or uninsured, contact:  [facility] or [facility]. to become qualified for the adult dental clinic.  Patients with Medicaid: [facility] [phone] [address], [place] [address], [phone]  If unable to pay, or uninsured, contact [facility] [phone]) or [place] [phone] in [place], [place] in [facility]) to become qualified for the adult dental clinic  Other [place]- [place], [place], [place], [address], [place], [place], 2nd and 4th Thursday of the month at 6:30am.  10 clients each day by appointment, can sometimes see walk-in patients if someone does not show for an appointment. [facility]- [address] [place]anker [place], [place], [address], [place], [place], [place], [address], [place] Department- [phone] [facility] [phone]

## 2021-10-04 NOTE — ED Notes (Signed)
Patient transported to CT 

## 2021-10-04 NOTE — ED Provider Notes (Signed)
[facility] EMERGENCY DEPARTMENT Provider Note   CSN: [id number] Arrival date & time: 10/04/21  1100     History  Chief Complaint  Patient presents with   Facial Swelling    Alexandra Floyd is a 56 y.o. female presenting today with swelling and tenderness surrounding the right eye.  States a couple days ago her dog head butted her in the upper cheek area close to her nose.  Known Hx of prior fracture at this area.  Dog had also licked at this area on the same day.  Then noticed swelling of the surrounding area that does not seem to be improving.  Denies fever, vision loss, neck stiffness, discharge, chills, headache, or other injuries.  Endorses mild blurry vision, however states her vision is overall still intact.  No other complaints at this time.  Tdap up to date.  The history is provided by the patient and medical records.     Home Medications Prior to Admission medications   Medication Sig Start Date End Date Taking? Authorizing Provider  amoxicillin-clavulanate (AUGMENTIN) 875-125 MG tablet Take 1 tablet by mouth every 12 (twelve) hours. 10/04/21  Yes Cecil Cobbs, PA-C  clindamycin (CLEOCIN) 300 MG capsule Take 1 capsule (300 mg total) by mouth 3 (three) times daily for 7 days. X 7 days 10/04/21 10/11/21 Yes Cecil Cobbs, PA-C  HYDROcodone-acetaminophen (NORCO/VICODIN) 5-325 MG tablet Take one tab po q 4 hrs prn pain 08/20/21   Triplett, Tammy, PA-C  levothyroxine (SYNTHROID) 25 MCG tablet Take 1 tablet (25 mcg total) by mouth daily at 6 (six) AM. 08/10/21   Jacquelin Hawking, PA-C  omeprazole (PRILOSEC) 40 MG capsule Take 1 capsule (40 mg total) by mouth daily. 08/10/21   Jacquelin Hawking, PA-C      Allergies    Lasix [furosemide]    Review of Systems   Review of Systems  Eyes:        Swelling around the right eye    Physical Exam Updated Vital Signs BP 121/82 (BP Location: Right Arm)   Pulse 78   Temp 98.1 F (36.7 C) (Oral)   Resp 16   Ht 5\' 3"  (1.6 m)    Wt 74.8 kg   SpO2 96%   BMI 29.23 kg/m  Physical Exam Vitals and nursing note reviewed.  Constitutional:      General: She is not in acute distress.    Appearance: She is well-developed. She is not ill-appearing, toxic-appearing or diaphoretic.  HENT:     Head: Normocephalic.  Eyes:     General: Gaze aligned appropriately. No scleral icterus.       Right eye: No foreign body or discharge.        Left eye: No foreign body or discharge.     Extraocular Movements: Extraocular movements intact.     Conjunctiva/sclera: Conjunctivae normal.     Right eye: Right conjunctiva is not injected. No exudate.    Left eye: Left conjunctiva is not injected. No exudate.    Pupils: Pupils are equal, round, and reactive to light.     Visual Fields: Right eye visual fields normal and left eye visual fields normal.     Comments: Mild periorbital edema, right-sided.  More significant inferior to the eye.  Mildly erythematous and TTP, without significant warmth.  No evidence of EOM entrapment or visual deficit.  Cardiovascular:     Rate and Rhythm: Normal rate and regular rhythm.     Heart sounds: No murmur heard. Pulmonary:  Effort: Pulmonary effort is normal. No respiratory distress.     Breath sounds: Normal breath sounds.  Abdominal:     Palpations: Abdomen is soft.     Tenderness: There is no abdominal tenderness.  Musculoskeletal:        General: No swelling.     Cervical back: Neck supple.  Skin:    General: Skin is warm and dry.     Capillary Refill: Capillary refill takes less than 2 seconds.  Neurological:     Mental Status: She is alert.  Psychiatric:        Mood and Affect: Mood normal.     ED Results / Procedures / Treatments   Labs (all labs ordered are listed, but only abnormal results are displayed) Labs Reviewed  CBG MONITORING, ED - Abnormal; Notable for the following components:      Result Value   Glucose-Capillary 110 (*)    All other components within normal  limits  BASIC METABOLIC PANEL  CBC    EKG None  Radiology CT MAXILLOFACIAL WO CONTRAST  Result Date: 10/04/2021 CLINICAL DATA:  Facial trauma, injury to right periorbital region last week, persistent redness and swelling EXAM: CT MAXILLOFACIAL WITHOUT CONTRAST TECHNIQUE: Multidetector CT imaging of the maxillofacial structures was performed. Multiplanar CT image reconstructions were also generated. RADIATION DOSE REDUCTION: This exam was performed according to the departmental dose-optimization program which includes automated exposure control, adjustment of the mA and/or kV according to patient size and/or use of iterative reconstruction technique. COMPARISON:  None Available. FINDINGS: Osseous: No fracture or mandibular dislocation. No destructive process. Orbits: Negative. No traumatic or inflammatory finding. Sinuses: Clear. Soft tissues: Soft tissue contusion of the right cheek (series 2, image 34). Limited intracranial: No significant or unexpected finding. IMPRESSION: 1. No displaced fracture or dislocation of the facial bones. 2. Soft tissue contusion of the right cheek. Electronically Signed   By: Jearld Lesch M.D.   On: 10/04/2021 14:40    Procedures Procedures    Medications Ordered in ED Medications  clindamycin (CLEOCIN) capsule 300 mg (has no administration in time range)  amoxicillin-clavulanate (AUGMENTIN) 875-125 MG per tablet 1 tablet (has no administration in time range)    ED Course/ Medical Decision Making/ A&P Clinical Course as of 10/04/21 1506  Mon Oct 04, 2021  1450 Consulted with Dr. Jayme Cloud of radiology who read the CT maxillofacial without contrast.  Ideally would like to have performed the imaging with contrast in order to differentiate between soft tissue swelling and preseptal cellulitis.  However due to patient's lack of cooperation, unable to use the contrast.  I was informed by Dr. Jayme Cloud that these would be difficult to differentiate without the use of  contrast and would best be resorted to clinical judgment.  Plan to treat with antibiotics for preseptal cellulitis based on physical exam. [AC]    Clinical Course User Index [AC] Cecil Cobbs, PA-C                           Medical Decision Making Amount and/or Complexity of Data Reviewed Labs: ordered. Radiology: ordered.  Risk Prescription drug management.   56 y.o. female presents to the ED for concern of Facial Swelling   This involves an extensive number of treatment options, and is a complaint that carries with it a high risk of complications and morbidity.  The emergent differential diagnosis prior to evaluation includes, but is not limited to: Blowout fracture, preseptal cellulitis  This is not an exhaustive differential.   Past Medical History / Co-morbidities / Social History: Hx of MDD, polysubstance abuse, alcohol abuse Social Determinants of Health include: None  Additional History:  None  Lab Tests: I ordered, and personally interpreted labs.  The pertinent results include:   Glucose 110, nonfasting  Imaging Studies: I ordered imaging studies including CT maxillofacial.   I independently visualized and interpreted imaging which showed periorbital swelling without intraorbital involvement or evidence of fracture I agree with the radiologist interpretation.  ED Course: Pt well-appearing on exam.  Presenting today with periorbital erythema, swelling, and tenderness over the last few days after "possible dog scratch" and blunt trauma of dog head hitting cheek.  Hx of prior traumatic facial fracture near same location of zygomatic bone.  Afebrile, nontoxic, nonseptic appearing in NAD.  No evidence of proptosis, chemosis, globe displacement, restricted EOMs, painful eye movements, or vision loss appreciated.  Mild subjective diplopia of, however visual acuity and visual fields appear grossly intact.  Without photophobia or drainage.  No wound or crepitus  appreciated on exam.  CT imaging negative for intraorbital involvement, blowout fracture, or emphysema, though suggests periorbital swelling.  Unable to utilize contrast with CT scan due to patient cooperation.  Without contrast, difficult to differentiate between simple soft tissue swelling versus preseptal cellulitis.  Based on history, physical exam, and presentation, plan to treat as preseptal cellulitis.  Recommend close follow-up with PCP within the next 2 to 3 days for reevaluation continue medical management.  Patient satisfied with today's encounter.  Patient in NAD and in good condition at time of discharge.  Disposition: After consideration the patient's encounter today, I do not feel today's workup suggests an emergent condition requiring admission or immediate intervention beyond what has been performed at this time.  Safe for discharge; instructed to return immediately for worsening symptoms, change in symptoms or any other concerns.  I have reviewed the patients home medicines and have made adjustments as needed.  Discussed course of treatment with the patient, whom demonstrated understanding.  Patient in agreement and has no further questions.    I discussed this case with my attending physician Dr. Deretha Emory, who agreed with the proposed treatment course and cosigned this note including patient's presenting symptoms, physical exam, and planned diagnostics and interventions.  Attending physician stated agreement with plan or made changes to plan which were implemented.     This chart was dictated using voice recognition software.  Despite best efforts to proofread, errors can occur which can change the documentation meaning.         Final Clinical Impression(s) / ED Diagnoses Final diagnoses:  Preseptal cellulitis of right lower eyelid    Rx / DC Orders ED Discharge Orders          Ordered    amoxicillin-clavulanate (AUGMENTIN) 875-125 MG tablet  Every 12 hours         10/04/21 1504    clindamycin (CLEOCIN) 300 MG capsule  3 times daily        10/04/21 1504              Cecil Cobbs, [place] 10/06/21 1519    Vanetta Mulders, MD 10/17/21 0003

## 2021-10-04 NOTE — ED Triage Notes (Signed)
Pt to er, pt states that she is here for some redness to her R eye orbit, pt states that she doesn't know if her dog scratched her or not.  Pt states that she has very dry mouth and it urinating a lot.  Pt denies DM

## 2021-10-04 NOTE — ED Notes (Signed)
2 IV attempts by this nurse. [place] at bedside for another RN to attempt.

## 2021-10-04 NOTE — ED Notes (Signed)
Per registration, patient went outside X2 and was told by this nurse to stay in her room and being outside with an IV in, is not allowed. Patient verbalized understanding.

## 2021-10-12 ENCOUNTER — Ambulatory Visit: Payer: Self-pay | Admitting: Physician Assistant

## 2021-11-04 ENCOUNTER — Encounter (HOSPITAL_COMMUNITY): Payer: Self-pay | Admitting: *Deleted

## 2021-11-04 ENCOUNTER — Other Ambulatory Visit: Payer: Self-pay

## 2021-11-04 ENCOUNTER — Emergency Department (HOSPITAL_COMMUNITY)
Admission: EM | Admit: 2021-11-04 | Discharge: 2021-11-04 | Disposition: A | Discharge status: Home / Self Care | Payer: Self-pay | Attending: Emergency Medicine | Admitting: Emergency Medicine

## 2021-11-04 DIAGNOSIS — Z5321 Procedure and treatment not carried out due to patient leaving prior to being seen by health care provider: Secondary | ICD-10-CM | POA: Insufficient documentation

## 2021-11-04 DIAGNOSIS — R309 Painful micturition, unspecified: Secondary | ICD-10-CM | POA: Insufficient documentation

## 2021-11-04 DIAGNOSIS — R109 Unspecified abdominal pain: Secondary | ICD-10-CM | POA: Insufficient documentation

## 2021-11-04 NOTE — ED Triage Notes (Signed)
Pt with right sided abd pain that radiates around to back.  + burning with urination at times.  Chills at home

## 2021-11-22 ENCOUNTER — Emergency Department (HOSPITAL_COMMUNITY)
Admission: EM | Admit: 2021-11-22 | Discharge: 2021-11-22 | Discharge status: Against Medical Advice | Payer: Self-pay | Attending: Emergency Medicine | Admitting: Emergency Medicine

## 2021-11-22 ENCOUNTER — Other Ambulatory Visit: Payer: Self-pay

## 2021-11-22 DIAGNOSIS — M549 Dorsalgia, unspecified: Secondary | ICD-10-CM | POA: Insufficient documentation

## 2021-11-22 DIAGNOSIS — Z5321 Procedure and treatment not carried out due to patient leaving prior to being seen by health care provider: Secondary | ICD-10-CM | POA: Insufficient documentation

## 2021-11-22 DIAGNOSIS — R509 Fever, unspecified: Secondary | ICD-10-CM | POA: Insufficient documentation

## 2021-11-22 LAB — URINALYSIS, ROUTINE W REFLEX MICROSCOPIC
Bacteria, UA: NONE SEEN
Bilirubin Urine: NEGATIVE
Glucose, UA: NEGATIVE mg/dL
Ketones, ur: NEGATIVE mg/dL
Leukocytes,Ua: NEGATIVE
Nitrite: NEGATIVE
Protein, ur: NEGATIVE mg/dL
Specific Gravity, Urine: 1.008 (ref 1.005–1.030)
pH: 6 (ref 5.0–8.0)

## 2021-11-22 NOTE — ED Triage Notes (Signed)
Pt with back pain and fever since yesterday per pt, burning with urination earlier.

## 2021-11-23 ENCOUNTER — Encounter (HOSPITAL_COMMUNITY): Payer: Self-pay | Admitting: Emergency Medicine

## 2021-11-23 ENCOUNTER — Other Ambulatory Visit: Payer: Self-pay

## 2021-11-23 ENCOUNTER — Emergency Department (HOSPITAL_COMMUNITY)
Admission: EM | Admit: 2021-11-23 | Discharge: 2021-11-23 | Disposition: A | Discharge status: Home / Self Care | Payer: Self-pay | Attending: Emergency Medicine | Admitting: Emergency Medicine

## 2021-11-23 ENCOUNTER — Emergency Department (HOSPITAL_COMMUNITY): Payer: Self-pay

## 2021-11-23 DIAGNOSIS — M545 Low back pain, unspecified: Secondary | ICD-10-CM

## 2021-11-23 DIAGNOSIS — Z91040 Latex allergy status: Secondary | ICD-10-CM | POA: Insufficient documentation

## 2021-11-23 DIAGNOSIS — R109 Unspecified abdominal pain: Secondary | ICD-10-CM | POA: Insufficient documentation

## 2021-11-23 DIAGNOSIS — R300 Dysuria: Secondary | ICD-10-CM | POA: Insufficient documentation

## 2021-11-23 DIAGNOSIS — M47816 Spondylosis without myelopathy or radiculopathy, lumbar region: Secondary | ICD-10-CM | POA: Insufficient documentation

## 2021-11-23 LAB — URINALYSIS, ROUTINE W REFLEX MICROSCOPIC
Bacteria, UA: NONE SEEN
Bilirubin Urine: NEGATIVE
Glucose, UA: NEGATIVE mg/dL
Ketones, ur: NEGATIVE mg/dL
Leukocytes,Ua: NEGATIVE
Nitrite: NEGATIVE
Protein, ur: NEGATIVE mg/dL
Specific Gravity, Urine: 1.002 — ABNORMAL LOW (ref 1.005–1.030)
pH: 6 (ref 5.0–8.0)

## 2021-11-23 MED ORDER — SULFAMETHOXAZOLE-TRIMETHOPRIM 800-160 MG PO TABS: 1.0000 | ORAL_TABLET | Freq: Two times a day (BID) | ORAL | 0 refills | Status: AC

## 2021-11-23 NOTE — ED Provider Notes (Signed)
[facility] EMERGENCY DEPARTMENT Provider Note   CSN: [id number] Arrival date & time: 11/23/21  1048     History  Chief Complaint  Patient presents with   Back Pain    Alexandra Floyd is a 56 y.o. female presenting for evaluation of intermittent episodes of dysuria, describing painful urination and increased frequency of urination.  She states the symptoms have been present since she completed a course of antibiotics last month prescribed here for facial infection.  She describes aching low back pain which is intermittent along with subjective fever, stating she feels like she is running a fever right now but has not measured any actual fevers at home.  Of note, her temperature here is 98.1.  She denies a history of low back pain, her symptoms are not triggered by movement, she denies radiation of pain into her extremities.  She has taken Azo with temporary improvement in her symptoms but states they return when she is not on this medication.  Denies history of kidney stones.  The history is provided by the patient.       Home Medications Prior to Admission medications   Medication Sig Start Date End Date Taking? Authorizing Provider  sulfamethoxazole-trimethoprim (BACTRIM DS) 800-160 MG tablet Take 1 tablet by mouth 2 (two) times daily for 7 days. 11/23/21 11/30/21 Yes Namon Villarin, Almyra Free, PA-C  HYDROcodone-acetaminophen (NORCO/VICODIN) 5-325 MG tablet Take one tab po q 4 hrs prn pain 08/20/21   Triplett, Tammy, PA-C  levothyroxine (SYNTHROID) 25 MCG tablet Take 1 tablet (25 mcg total) by mouth daily at 6 (six) AM. 08/10/21   Soyla Dryer, PA-C  omeprazole (PRILOSEC) 40 MG capsule Take 1 capsule (40 mg total) by mouth daily. 08/10/21   Soyla Dryer, PA-C      Allergies    Lasix [furosemide]    Review of Systems   Review of Systems  Constitutional:  Positive for fever.       Reports subjective fever  HENT:  Negative for congestion and sore throat.   Eyes: Negative.    Respiratory:  Negative for chest tightness and shortness of breath.   Cardiovascular:  Negative for chest pain.  Gastrointestinal:  Negative for abdominal pain, nausea and vomiting.  Genitourinary:  Positive for dysuria, frequency and urgency. Negative for flank pain and vaginal discharge.  Musculoskeletal:  Positive for back pain. Negative for arthralgias, joint swelling and neck pain.  Skin: Negative.  Negative for rash and wound.  Neurological:  Negative for dizziness, weakness, light-headedness, numbness and headaches.  Psychiatric/Behavioral: Negative.      Physical Exam Updated Vital Signs BP (!) 149/97   Pulse 61   Temp 98.1 F (36.7 C) (Oral)   Resp 19   SpO2 100%  Physical Exam Vitals and nursing note reviewed.  Constitutional:      Appearance: She is well-developed.  HENT:     Head: Normocephalic and atraumatic.  Eyes:     Conjunctiva/sclera: Conjunctivae normal.  Cardiovascular:     Rate and Rhythm: Normal rate and regular rhythm.     Heart sounds: Normal heart sounds.  Pulmonary:     Effort: Pulmonary effort is normal.     Breath sounds: Normal breath sounds. No wheezing.  Abdominal:     General: Abdomen is protuberant. Bowel sounds are normal.     Palpations: Abdomen is soft.     Tenderness: There is no abdominal tenderness. There is no right CVA tenderness, left CVA tenderness or guarding.  Musculoskeletal:  General: No tenderness. Normal range of motion.     Cervical back: Normal range of motion.  Skin:    General: Skin is warm and dry.  Neurological:     General: No focal deficit present.     Mental Status: She is alert.    ED Results / Procedures / Treatments   Labs (all labs ordered are listed, but only abnormal results are displayed) Labs Reviewed  URINALYSIS, ROUTINE W REFLEX MICROSCOPIC - Abnormal; Notable for the following components:      Result Value   Color, Urine COLORLESS (*)    Specific Gravity, Urine 1.002 (*)    Hgb urine  dipstick SMALL (*)    All other components within normal limits  URINE CULTURE    EKG None  Radiology CT Renal Stone Study  Result Date: 11/23/2021 CLINICAL DATA:  Pain across the lower back. EXAM: CT ABDOMEN AND PELVIS WITHOUT CONTRAST TECHNIQUE: Multidetector CT imaging of the abdomen and pelvis was performed following the standard protocol without IV contrast. RADIATION DOSE REDUCTION: This exam was performed according to the departmental dose-optimization program which includes automated exposure control, adjustment of the mA and/or kV according to patient size and/or use of iterative reconstruction technique. COMPARISON:  08/20/2021. FINDINGS: Lower chest: No acute abnormality. Hepatobiliary: No focal liver abnormality is seen. Status post cholecystectomy. No biliary dilatation. Pancreas: Unremarkable. No pancreatic ductal dilatation or surrounding inflammatory changes. Spleen: Normal in size without focal abnormality. Adrenals/Urinary Tract: Adrenal glands are unremarkable. Kidneys are normal, without renal calculi, focal lesion, or hydronephrosis. Bladder is unremarkable. Stomach/Bowel: Stomach is within normal limits. Appendix appears normal. No evidence of bowel wall thickening, distention, or inflammatory changes. Vascular/Lymphatic: Minor aortic atherosclerotic calcifications. No aneurysm. No enlarged lymph nodes. Reproductive: Uterus and bilateral adnexa are unremarkable. Other: No abdominal wall hernia or abnormality. No abdominopelvic ascites. Musculoskeletal: No fracture or acute finding. No bone lesion. Degenerative changes noted throughout the visualized spine. IMPRESSION: 1. No acute findings within the abdomen or pelvis. No renal or ureteral stones or obstructive uropathy. 2. Minor aortic atherosclerosis. 3. Significant degenerative changes noted throughout the visualized spine. Electronically Signed   By: Lajean Manes M.D.   On: 11/23/2021 13:34    Procedures Procedures     Medications Ordered in ED Medications - No data to display  ED Course/ Medical Decision Making/ A&P                           Medical Decision Making Patient presenting with rather chronic dysuria type symptoms for several weeks now including low back pain, painful urination and increased urinary frequency.  She has been self-medicating by increasing fluid intake and taking Azo, states she gets temporary improvement but then her symptoms returned.  She has had subjective fever, none here.  She denies history of kidney stones.  Her pain is worsened with movement suggesting her back pain may be of a musculoskeletal source.  She does have a small amount of hemoglobin in her urine therefore we proceeded to CT imaging to rule out kidney stones, this was a negative study.  Given her dysuria I will treat her with antibiotics while we are waiting urine culture which has been ordered.  She was advised close follow-up with her primary provider or return here for any new or worsening symptoms.  Vital signs have been stable here, no indication that this represents urosepsis or pyelonephritis, especially with a normal CT result.  She does have significant  degenerative changes throughout the lumbar spine.  Amount and/or Complexity of Data Reviewed Labs: ordered.    Details: Hematuria. Radiology: ordered.    Details: CT imaging per above.  Risk Prescription drug management.           Final Clinical Impression(s) / ED Diagnoses Final diagnoses:  Dysuria  Acute bilateral low back pain without sciatica  Spondylosis of lumbar region without myelopathy or radiculopathy    Rx / DC Orders ED Discharge Orders          Ordered    sulfamethoxazole-trimethoprim (BACTRIM DS) 800-160 MG tablet  2 times daily        11/23/21 1347              Evalee Jefferson, PA-C 11/25/21 1442    Fransico Meadow, MD 11/26/21 1315

## 2021-11-23 NOTE — Discharge Instructions (Signed)
You are being treated with antibiotics as your symptoms suggest you may have a urinary infection, although your urine today only shows some red blood cells in this urine sample.  Your CT scan is negative for kidney stones but you do have some arthritis in your lower back.  Plan follow-up with your primary doctor for a recheck of your symptoms once your antibiotics have been completed.

## 2021-11-23 NOTE — ED Triage Notes (Signed)
Pt here yesterday for same but not seen. Pt c/o pain across lower back. Denies urinary changes but in previous note yesterday she had burning. Nad.

## 2021-11-25 ENCOUNTER — Other Ambulatory Visit: Payer: Self-pay

## 2021-11-25 ENCOUNTER — Emergency Department (HOSPITAL_COMMUNITY)
Admission: EM | Admit: 2021-11-25 | Discharge: 2021-11-25 | Disposition: A | Discharge status: Home / Self Care | Payer: Self-pay | Attending: Emergency Medicine | Admitting: Emergency Medicine

## 2021-11-25 ENCOUNTER — Encounter (HOSPITAL_COMMUNITY): Payer: Self-pay | Admitting: Emergency Medicine

## 2021-11-25 DIAGNOSIS — R110 Nausea: Secondary | ICD-10-CM | POA: Insufficient documentation

## 2021-11-25 DIAGNOSIS — R350 Frequency of micturition: Secondary | ICD-10-CM | POA: Insufficient documentation

## 2021-11-25 DIAGNOSIS — R300 Dysuria: Secondary | ICD-10-CM | POA: Insufficient documentation

## 2021-11-25 DIAGNOSIS — R3915 Urgency of urination: Secondary | ICD-10-CM | POA: Insufficient documentation

## 2021-11-25 DIAGNOSIS — R109 Unspecified abdominal pain: Secondary | ICD-10-CM | POA: Insufficient documentation

## 2021-11-25 DIAGNOSIS — R509 Fever, unspecified: Secondary | ICD-10-CM | POA: Insufficient documentation

## 2021-11-25 LAB — CBC WITH DIFFERENTIAL/PLATELET
Abs Immature Granulocytes: 0.02 10*3/uL (ref 0.00–0.07)
Basophils Absolute: 0.1 10*3/uL (ref 0.0–0.1)
Basophils Relative: 2 %
Eosinophils Absolute: 0.2 10*3/uL (ref 0.0–0.5)
Eosinophils Relative: 4 %
HCT: 42.5 % (ref 36.0–46.0)
Hemoglobin: 13.8 g/dL (ref 12.0–15.0)
Immature Granulocytes: 1 %
Lymphocytes Relative: 28 %
Lymphs Abs: 1.1 10*3/uL (ref 0.7–4.0)
MCH: 30.9 pg (ref 26.0–34.0)
MCHC: 32.5 g/dL (ref 30.0–36.0)
MCV: 95.3 fL (ref 80.0–100.0)
Monocytes Absolute: 0.5 10*3/uL (ref 0.1–1.0)
Monocytes Relative: 11 %
Neutro Abs: 2.2 10*3/uL (ref 1.7–7.7)
Neutrophils Relative %: 54 %
Platelets: 289 10*3/uL (ref 150–400)
RBC: 4.46 MIL/uL (ref 3.87–5.11)
RDW: 13 % (ref 11.5–15.5)
WBC: 4 10*3/uL (ref 4.0–10.5)
nRBC: 0 % (ref 0.0–0.2)

## 2021-11-25 LAB — URINE CULTURE

## 2021-11-25 LAB — BASIC METABOLIC PANEL
Anion gap: 6 (ref 5–15)
BUN: 12 mg/dL (ref 6–20)
CO2: 25 mmol/L (ref 22–32)
Calcium: 9.3 mg/dL (ref 8.9–10.3)
Chloride: 106 mmol/L (ref 98–111)
Creatinine, Ser: 1.01 mg/dL — ABNORMAL HIGH (ref 0.44–1.00)
GFR, Estimated: >60 mL/min (ref >60)
Glucose, Bld: 87 mg/dL (ref 70–99)
Potassium: 3.8 mmol/L (ref 3.5–5.1)
Sodium: 137 mmol/L (ref 135–145)

## 2021-11-25 LAB — URINALYSIS, ROUTINE W REFLEX MICROSCOPIC
Bilirubin Urine: NEGATIVE
Glucose, UA: NEGATIVE mg/dL
Hgb urine dipstick: NEGATIVE
Ketones, ur: NEGATIVE mg/dL
Leukocytes,Ua: NEGATIVE
Nitrite: NEGATIVE
Protein, ur: NEGATIVE mg/dL
Specific Gravity, Urine: 1.004 — ABNORMAL LOW (ref 1.005–1.030)
pH: 6 (ref 5.0–8.0)

## 2021-11-25 LAB — LACTIC ACID, PLASMA: Lactic Acid, Venous: 0.9 mmol/L (ref 0.5–1.9)

## 2021-11-25 MED ORDER — ACETAMINOPHEN 325 MG PO TABS
650.0000 mg | ORAL_TABLET | Freq: Once | ORAL | Status: AC
  Administered 2021-11-25: 650 mg via ORAL
  Filled 2021-11-25: qty 2

## 2021-11-25 NOTE — Discharge Instructions (Addendum)
All of your lab work was normal today.  There is no signs of urinary tract infection.  I sent off your urine for another culture.  This will result in a couple of days and you will be contacted if the results are positive.  There is no signs of infection in your bloodstream either.  This could be the beginning of a viral illness like COVID or flu.  Antibiotics are not needed for these.  You do not need to continue taking antibiotics at this time.  Please return to the emergency department for any worsening symptoms you might have.

## 2021-11-25 NOTE — ED Triage Notes (Signed)
Pt presents with dysuria symptoms, diagnosed with UTI at this ED has been on Abts for 2 days and believes symptom are getting worse.

## 2021-11-25 NOTE — ED Provider Notes (Signed)
[facility] EMERGENCY DEPARTMENT Provider Note   CSN: [id number] Arrival date & time: 11/25/21  0920     History Chief Complaint  Patient presents with   Dysuria    Alexandra Floyd is a 56 y.o. female patient who presents to the emergency department today for further evaluation of dysuria, urinary frequency, and urgency.  This has been ongoing for the last week.  Patient was seen evaluated here 2 days ago for similar symptoms.  She was placed on Bactrim.  Urinalysis on my review did not reveal any significant signs of infection.  Urine culture did grow out multiple species and there was a recommendation for repeat specimen.  She states that she is taking her antibiotics with no relief and now feels that the infection is in her "bloodstream."  She reports subjective fever, nausea, and flank pain.  She denies any vomiting, abdominal pain, chills, cough, congestion.   Dysuria      Home Medications Prior to Admission medications   Medication Sig Start Date End Date Taking? Authorizing Provider  levothyroxine (SYNTHROID) 25 MCG tablet Take 1 tablet (25 mcg total) by mouth daily at 6 (six) AM. 08/10/21  Yes Soyla Dryer, PA-C  omeprazole (PRILOSEC) 40 MG capsule Take 1 capsule (40 mg total) by mouth daily. 08/10/21  Yes Soyla Dryer, PA-C  sulfamethoxazole-trimethoprim (BACTRIM DS) 800-160 MG tablet Take 1 tablet by mouth 2 (two) times daily for 7 days. 11/23/21 11/30/21 Yes Idol, Almyra Free, PA-C  HYDROcodone-acetaminophen (NORCO/VICODIN) 5-325 MG tablet Take one tab po q 4 hrs prn pain Patient not taking: Reported on 11/25/2021 08/20/21   Kem Parkinson, PA-C      Allergies    Lasix [furosemide]    Review of Systems   Review of Systems  Genitourinary:  Positive for dysuria.  All other systems reviewed and are negative.   Physical Exam Updated Vital Signs BP 138/83 (BP Location: Right Arm)   Pulse (!) 55   Temp 98.4 F (36.9 C) (Oral)   Resp 18   Ht 5\' 3"  (1.6 m)   Wt  79.4 kg   SpO2 100%   BMI 31.00 kg/m  Physical Exam Vitals and nursing note reviewed.  Constitutional:      General: She is not in acute distress.    Appearance: Normal appearance.  HENT:     Head: Normocephalic and atraumatic.  Eyes:     General:        Right eye: No discharge.        Left eye: No discharge.  Cardiovascular:     Comments: Regular rate and rhythm.  S1/S2 are distinct without any evidence of murmur, rubs, or gallops.  Radial pulses are 2+ bilaterally.  Dorsalis pedis pulses are 2+ bilaterally.  No evidence of pedal edema. Pulmonary:     Comments: Clear to auscultation bilaterally.  Normal effort.  No respiratory distress.  No evidence of wheezes, rales, or rhonchi heard throughout. Abdominal:     General: Abdomen is flat. Bowel sounds are normal. There is no distension.     Tenderness: There is no abdominal tenderness. There is no right CVA tenderness, left CVA tenderness, guarding or rebound.  Musculoskeletal:        General: Normal range of motion.     Cervical back: Neck supple.  Skin:    General: Skin is warm and dry.     Findings: No rash.  Neurological:     General: No focal deficit present.     Mental Status: She is  alert.  Psychiatric:        Mood and Affect: Mood normal.        Behavior: Behavior normal.     ED Results / Procedures / Treatments   Labs (all labs ordered are listed, but only abnormal results are displayed) Labs Reviewed  BASIC METABOLIC PANEL - Abnormal; Notable for the following components:      Result Value   Creatinine, Ser 1.01 (*)    All other components within normal limits  URINALYSIS, ROUTINE W REFLEX MICROSCOPIC - Abnormal; Notable for the following components:   Color, Urine STRAW (*)    Specific Gravity, Urine 1.004 (*)    All other components within normal limits  URINE CULTURE  CBC WITH DIFFERENTIAL/PLATELET  LACTIC ACID, PLASMA    EKG None  Radiology CT Renal Stone Study  Result Date:  11/23/2021 CLINICAL DATA:  Pain across the lower back. EXAM: CT ABDOMEN AND PELVIS WITHOUT CONTRAST TECHNIQUE: Multidetector CT imaging of the abdomen and pelvis was performed following the standard protocol without IV contrast. RADIATION DOSE REDUCTION: This exam was performed according to the departmental dose-optimization program which includes automated exposure control, adjustment of the mA and/or kV according to patient size and/or use of iterative reconstruction technique. COMPARISON:  08/20/2021. FINDINGS: Lower chest: No acute abnormality. Hepatobiliary: No focal liver abnormality is seen. Status post cholecystectomy. No biliary dilatation. Pancreas: Unremarkable. No pancreatic ductal dilatation or surrounding inflammatory changes. Spleen: Normal in size without focal abnormality. Adrenals/Urinary Tract: Adrenal glands are unremarkable. Kidneys are normal, without renal calculi, focal lesion, or hydronephrosis. Bladder is unremarkable. Stomach/Bowel: Stomach is within normal limits. Appendix appears normal. No evidence of bowel wall thickening, distention, or inflammatory changes. Vascular/Lymphatic: Minor aortic atherosclerotic calcifications. No aneurysm. No enlarged lymph nodes. Reproductive: Uterus and bilateral adnexa are unremarkable. Other: No abdominal wall hernia or abnormality. No abdominopelvic ascites. Musculoskeletal: No fracture or acute finding. No bone lesion. Degenerative changes noted throughout the visualized spine. IMPRESSION: 1. No acute findings within the abdomen or pelvis. No renal or ureteral stones or obstructive uropathy. 2. Minor aortic atherosclerosis. 3. Significant degenerative changes noted throughout the visualized spine. Electronically Signed   By: Amie Portland M.D.   On: 11/23/2021 13:34    Procedures Procedures    Medications Ordered in ED Medications  acetaminophen (TYLENOL) tablet 650 mg (650 mg Oral Given 11/25/21 1127)    ED Course/ Medical Decision  Making/ A&P Clinical Course as of 11/25/21 1253  Thu Nov 25, 2021  1109 CBC with Differential Normal. [CF]  1109 Urinalysis, Routine w reflex microscopic Urine, Clean Catch(!) No signs of urinary tract infection. [CF]  1213 Basic metabolic panel(!) Normal  [CF]  1250 Lactic acid, plasma Normal. [CF]  1250 Urine Culture In process. [CF]    Clinical Course User Index [CF] Teressa Lower, PA-C                           Medical Decision Making Jamesia Linnen is a 56 y.o. female patient who [facility] for further evaluation of urinary urgency and dysuria.  As highlighted in my HPI I did a thorough chart review and the urine did not appear overtly infected during her most recent visit.  Urine culture did grew out multiple species but this could also be contamination.  I will repeat the urinalysis and culture today.  I will also add on some basic labs to evaluate her kidney function and look  for overall signs of infection.  Vital signs are completely normal here.  She is afebrile.  I will plan to reassess once the labs result.  After reviewing labs, I do not see any signs of urinary tract infection or urosepsis.  This could be developing a viral illness which I did not test for to include COVID or influenza.  Patient is not endorsing a cough and her lungs were clear.  I have a low suspicion for pneumonia.  I instructed her to stop taking antibiotics as she likely does not need them.  I sent her urine off for culture.  She is safe for discharge at this time.  Strict return precautions were discussed.  Amount and/or Complexity of Data Reviewed Labs: ordered. Decision-making details documented in ED Course.  Risk OTC drugs.   Final Clinical Impression(s) / ED Diagnoses Final diagnoses:  Dysuria    Rx / DC Orders ED Discharge Orders     None         Teressa Lower, [place] 11/25/21 1253    Bethann Berkshire, MD 11/28/21 1144

## 2021-11-26 LAB — URINE CULTURE: Culture: NO GROWTH

## 2021-12-22 IMAGING — CT CT ABD-PELV W/ CM
2 of 5 series · 16 of 46 positions shown, 18 images · IV contrast (omnipaque)
Comparison: None.

CLINICAL DATA: Abdominal pain, acute, nonlocalized Generalized pain
in the lower quadrants, Diarrhea x 10 days

EXAM:
CT ABDOMEN AND PELVIS WITH CONTRAST
TECHNIQUE: Multidetector CT imaging of the abdomen and pelvis was performed
using the standard protocol following bolus administration of
intravenous contrast.
CONTRAST:  100mL OMNIPAQUE IOHEXOL 300 MG/ML  SOLN

[Series 2: axial st · axial · 0.81mm/px · z∈[+815,+1240]mm · 13 of 95 slices shown, 15 images]
[im 5/95  soft-tissue]
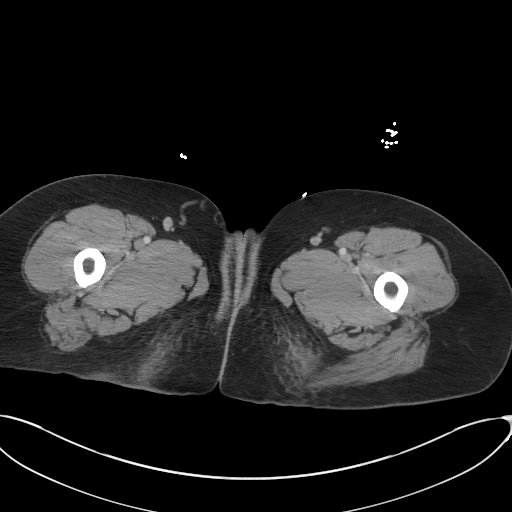
[im 5/95  bone]
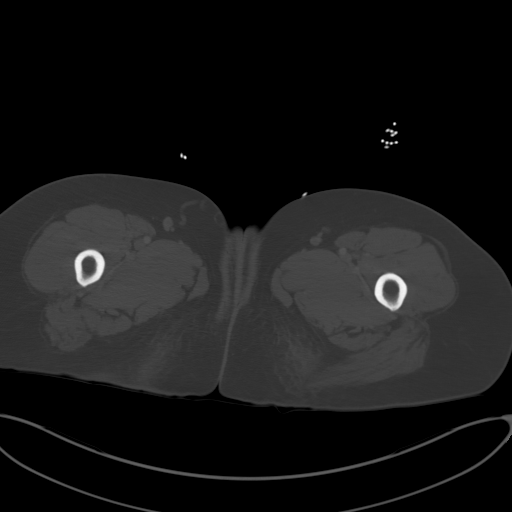
[im 15/95  soft-tissue]
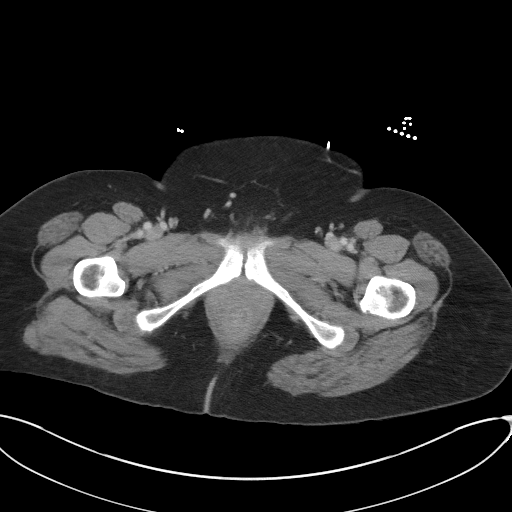
[im 20/95  soft-tissue]
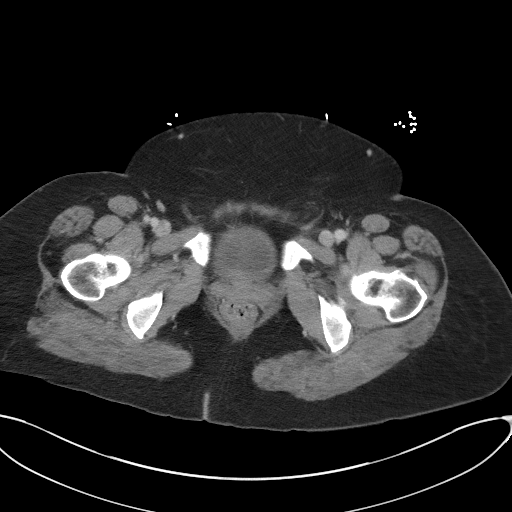
[im 25/95  soft-tissue]
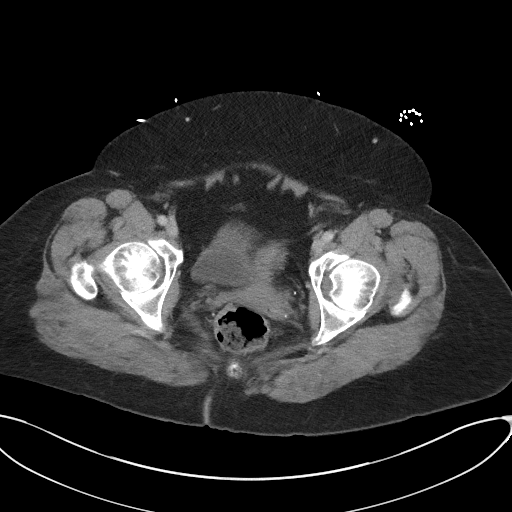
[im 35/95  soft-tissue]
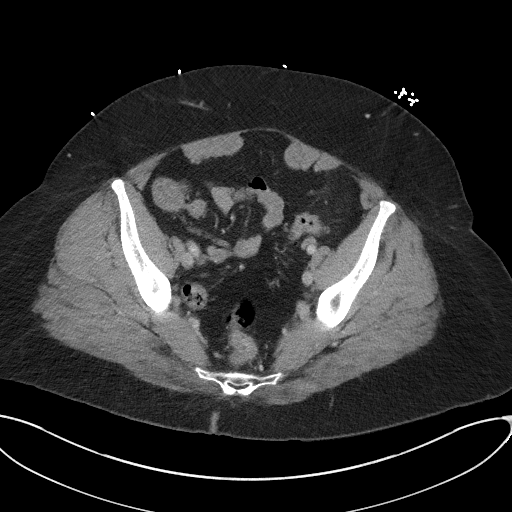
[im 40/95  soft-tissue]
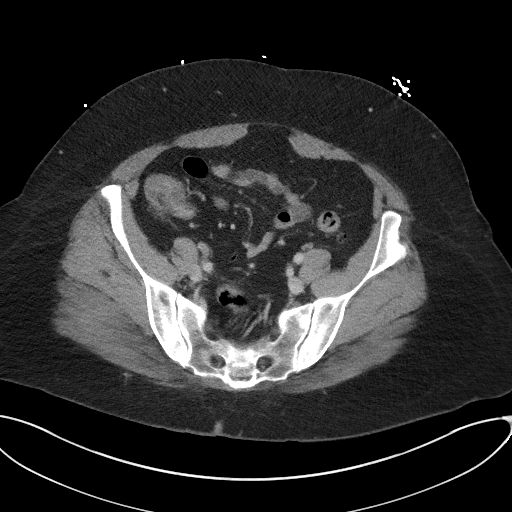
[im 50/95  soft-tissue]
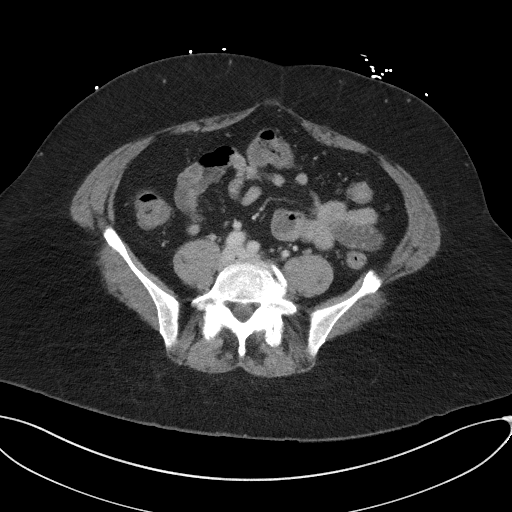
[im 55/95  soft-tissue]
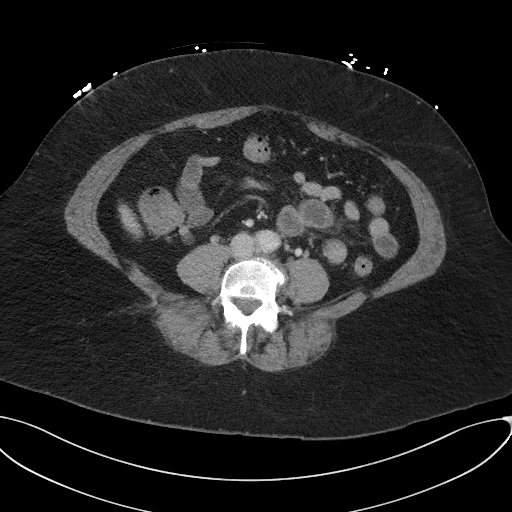
[im 60/95  soft-tissue]
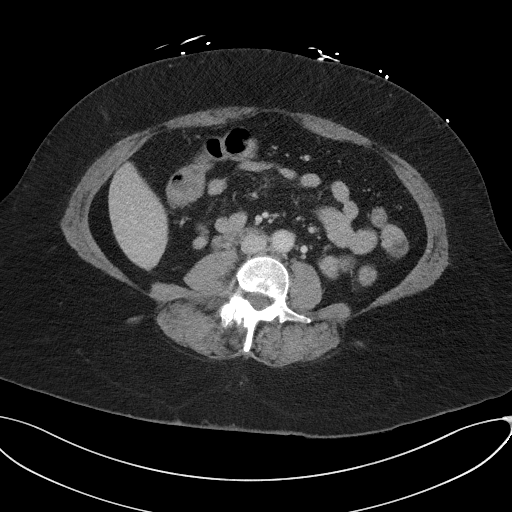
[im 60/95  bone]
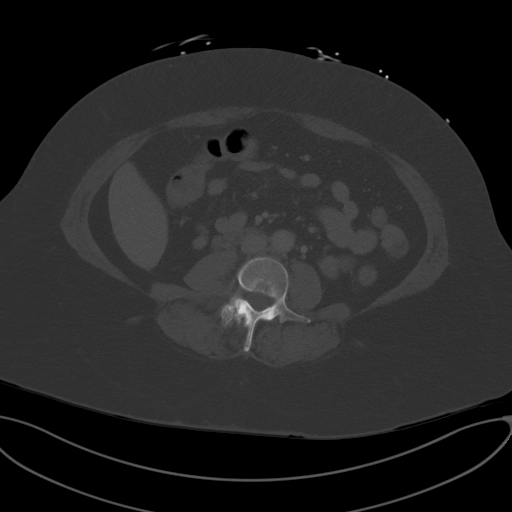
[im 70/95  soft-tissue]
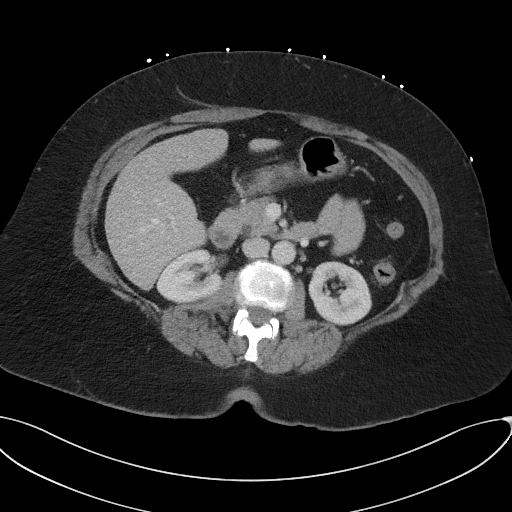
[im 75/95  soft-tissue]
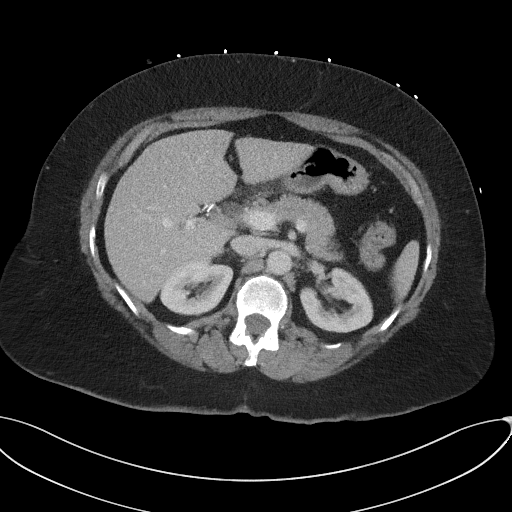
[im 80/95  soft-tissue]
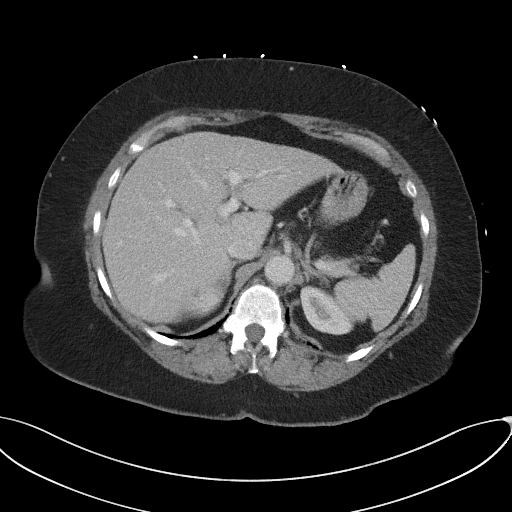
[im 90/95  soft-tissue]
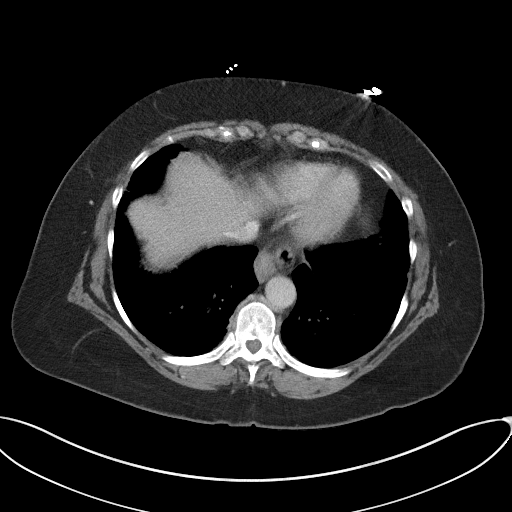

[Series 5: coronal st · coronal · 0.82mm/px · 3 of 116 slices shown]
[im 39/116  soft-tissue]
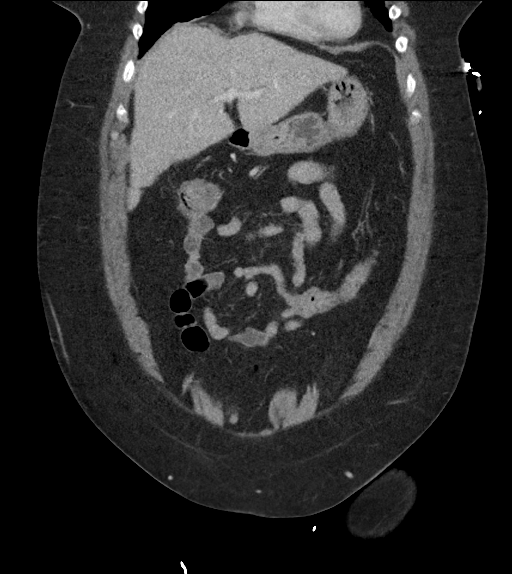
[im 52/116  soft-tissue]
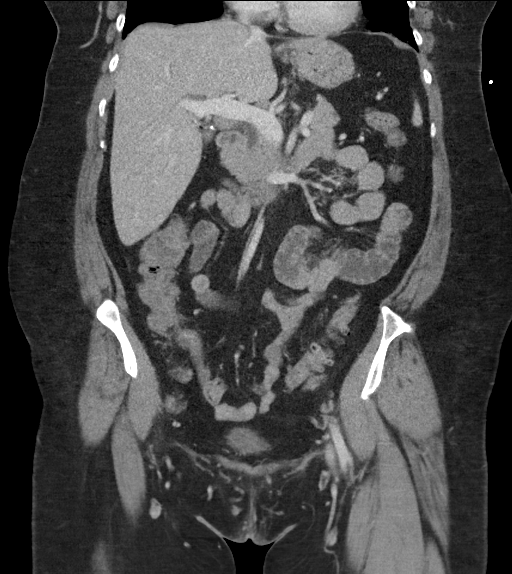
[im 64/116  soft-tissue]
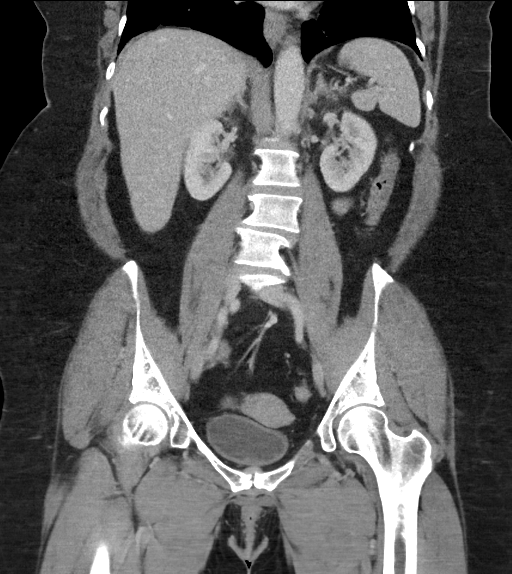

[16 of 46 positions shown; findings below may reference images not displayed]

FINDINGS: Inferior chest: The lung bases are well-aerated.

Hepatobiliary: The liver is normal in size without focal
abnormality. No intrahepatic or extrahepatic biliary ductal
dilation. The gallbladder is surgically absent.

Spleen: Normal in size without focal abnormality.

Pancreas: No pancreatic ductal dilatation or surrounding
inflammatory changes.

Adrenals/Urinary Tract: Adrenal glands are unremarkable. Kidneys are
normal, without renal calculi, focal lesion, or hydronephrosis.
Bladder is unremarkable.

Stomach/Bowel: The stomach, small bowel and large bowel are normal
in caliber without abnormal wall thickening or surrounding
inflammatory changes. The appendix is normal. Small hiatal hernia.
Sigmoid diverticulosis without active inflammatory changes.

Reproductive: Uterus and bilateral adnexa are unremarkable.

Lymphatic: No enlarged lymph nodes in the abdomen or pelvis.

Vasculature: The abdominal aorta is normal in caliber. The portal
venous system is patent.

Other: No abdominopelvic ascites.

Musculoskeletal: No aggressive osseous lesions. The soft tissues are
unremarkable.
IMPRESSION: 1.  No acute abnormality in the abdomen or pelvis.

2.  Sigmoid diverticulosis without active inflammatory changes.

## 2021-12-26 ENCOUNTER — Encounter (HOSPITAL_COMMUNITY): Payer: Self-pay

## 2021-12-26 ENCOUNTER — Emergency Department (HOSPITAL_COMMUNITY)
Admission: EM | Admit: 2021-12-26 | Discharge: 2021-12-26 | Disposition: A | Discharge status: Home / Self Care | Payer: Self-pay | Attending: Emergency Medicine | Admitting: Emergency Medicine

## 2021-12-26 DIAGNOSIS — A084 Viral intestinal infection, unspecified: Secondary | ICD-10-CM | POA: Insufficient documentation

## 2021-12-26 LAB — CBC WITH DIFFERENTIAL/PLATELET
Abs Immature Granulocytes: 0.02 10*3/uL (ref 0.00–0.07)
Basophils Absolute: 0 10*3/uL (ref 0.0–0.1)
Basophils Relative: 0 %
Eosinophils Absolute: 0.1 10*3/uL (ref 0.0–0.5)
Eosinophils Relative: 1 %
HCT: 42.4 % (ref 36.0–46.0)
Hemoglobin: 14.3 g/dL (ref 12.0–15.0)
Immature Granulocytes: 0 %
Lymphocytes Relative: 16 %
Lymphs Abs: 1.1 10*3/uL (ref 0.7–4.0)
MCH: 31.4 pg (ref 26.0–34.0)
MCHC: 33.7 g/dL (ref 30.0–36.0)
MCV: 93 fL (ref 80.0–100.0)
Monocytes Absolute: 0.5 10*3/uL (ref 0.1–1.0)
Monocytes Relative: 7 %
Neutro Abs: 5.3 10*3/uL (ref 1.7–7.7)
Neutrophils Relative %: 76 %
Platelets: 293 10*3/uL (ref 150–400)
RBC: 4.56 MIL/uL (ref 3.87–5.11)
RDW: 12.5 % (ref 11.5–15.5)
WBC: 7 10*3/uL (ref 4.0–10.5)
nRBC: 0 % (ref 0.0–0.2)

## 2021-12-26 LAB — URINALYSIS, ROUTINE W REFLEX MICROSCOPIC
Bilirubin Urine: NEGATIVE
Glucose, UA: NEGATIVE mg/dL
Hgb urine dipstick: NEGATIVE
Ketones, ur: NEGATIVE mg/dL
Leukocytes,Ua: NEGATIVE
Nitrite: NEGATIVE
Protein, ur: 30 mg/dL — AB
Specific Gravity, Urine: 1.023 (ref 1.005–1.030)
pH: 7 (ref 5.0–8.0)

## 2021-12-26 LAB — COMPREHENSIVE METABOLIC PANEL
ALT: 32 U/L (ref 0–44)
AST: 26 U/L (ref 15–41)
Albumin: 4.4 g/dL (ref 3.5–5.0)
Alkaline Phosphatase: 98 U/L (ref 38–126)
Anion gap: 14 (ref 5–15)
BUN: 14 mg/dL (ref 6–20)
CO2: 22 mmol/L (ref 22–32)
Calcium: 10 mg/dL (ref 8.9–10.3)
Chloride: 101 mmol/L (ref 98–111)
Creatinine, Ser: 0.81 mg/dL (ref 0.44–1.00)
GFR, Estimated: >60 mL/min (ref >60)
Glucose, Bld: 104 mg/dL — ABNORMAL HIGH (ref 70–99)
Potassium: 3.8 mmol/L (ref 3.5–5.1)
Sodium: 137 mmol/L (ref 135–145)
Total Bilirubin: 0.7 mg/dL (ref 0.3–1.2)
Total Protein: 8.8 g/dL — ABNORMAL HIGH (ref 6.5–8.1)

## 2021-12-26 LAB — POC URINE PREG, ED: Preg Test, Ur: NEGATIVE

## 2021-12-26 LAB — LIPASE, BLOOD: Lipase: 31 U/L (ref 11–51)

## 2021-12-26 MED ORDER — ONDANSETRON HCL 4 MG/2ML IJ SOLN
4.0000 mg | Freq: Once | INTRAMUSCULAR | Status: AC
  Administered 2021-12-26: 4 mg via INTRAVENOUS
  Filled 2021-12-26: qty 2

## 2021-12-26 MED ORDER — ONDANSETRON HCL 4 MG PO TABS: 4.0000 mg | ORAL_TABLET | Freq: Three times a day (TID) | ORAL | 0 refills | Status: DC | PRN

## 2021-12-26 MED ORDER — FAMOTIDINE 20 MG PO TABS: 20.0000 mg | ORAL_TABLET | Freq: Two times a day (BID) | ORAL | 0 refills | Status: DC

## 2021-12-26 MED ORDER — ALUM & MAG HYDROXIDE-SIMETH 200-200-20 MG/5ML PO SUSP
30.0000 mL | Freq: Once | ORAL | Status: AC
  Administered 2021-12-26: 30 mL via ORAL
  Filled 2021-12-26: qty 30

## 2021-12-26 MED ORDER — PANTOPRAZOLE SODIUM 40 MG IV SOLR
40.0000 mg | Freq: Once | INTRAVENOUS | Status: AC
  Administered 2021-12-26: 40 mg via INTRAVENOUS
  Filled 2021-12-26: qty 10

## 2021-12-26 NOTE — ED Provider Notes (Signed)
[facility] EMERGENCY DEPARTMENT Provider Note   CSN: [id number] Arrival date & time: 12/26/21  2035     History  Chief Complaint  Patient presents with   Abdominal Pain   Nausea    Alexandra Floyd is a 56 y.o. female with medical history including hepatitis C, heart murmur, anxiety, surgical history of cholecystectomy presenting with a several day history of upper abdominal pain described as waxing and waning severe cramping.  Yesterday evening she developed nausea vomiting and diarrhea.  She reports multiple episodes of these symptoms throughout the course of the night.  Her abdominal pain improves after having an episode of vomiting or diarrhea.  She endorses a burning sensation in her epigastric region that radiates up into her chest since the vomiting began.  She has had no hematemesis, has seen no bloody stools, no fevers, no recent travel or exposure to others with similar symptoms.  She took half of Vicodin tablet yesterday evening for her pain which did not relieve the symptom.  She denies shortness of breath, dysuria.  Denies back pain.  The history is provided by the patient.       Home Medications Prior to Admission medications   Medication Sig Start Date End Date Taking? Authorizing Provider  famotidine (PEPCID) 20 MG tablet Take 1 tablet (20 mg total) by mouth 2 (two) times daily. 12/26/21  Yes Jaziah Kwasnik, Almyra Free, PA-C  ondansetron (ZOFRAN) 4 MG tablet Take 1 tablet (4 mg total) by mouth every 8 (eight) hours as needed for nausea or vomiting. 12/26/21  Yes Brion Hedges, Almyra Free, PA-C  HYDROcodone-acetaminophen (NORCO/VICODIN) 5-325 MG tablet Take one tab po q 4 hrs prn pain Patient not taking: Reported on 11/25/2021 08/20/21   Kem Parkinson, PA-C  levothyroxine (SYNTHROID) 25 MCG tablet Take 1 tablet (25 mcg total) by mouth daily at 6 (six) AM. 08/10/21   Soyla Dryer, PA-C  omeprazole (PRILOSEC) 40 MG capsule Take 1 capsule (40 mg total) by mouth daily. 08/10/21   Soyla Dryer,  PA-C      Allergies    Lasix [furosemide]    Review of Systems   Review of Systems  Constitutional:  Negative for fever.  HENT:  Negative for congestion and sore throat.   Eyes: Negative.   Respiratory:  Negative for chest tightness and shortness of breath.   Cardiovascular:  Negative for chest pain.  Gastrointestinal:  Positive for abdominal pain, diarrhea, nausea and vomiting.  Genitourinary: Negative.   Musculoskeletal:  Negative for arthralgias, joint swelling and neck pain.  Skin: Negative.  Negative for rash and wound.  Neurological:  Negative for dizziness, weakness, light-headedness, numbness and headaches.  Psychiatric/Behavioral: Negative.    All other systems reviewed and are negative.   Physical Exam Updated Vital Signs BP 134/79 (BP Location: Right Arm)   Pulse 92   Temp 98 F (36.7 C)   Resp 16   SpO2 97%  Physical Exam Vitals and nursing note reviewed.  Constitutional:      Appearance: She is well-developed.  HENT:     Head: Normocephalic and atraumatic.  Eyes:     Conjunctiva/sclera: Conjunctivae normal.  Cardiovascular:     Rate and Rhythm: Normal rate and regular rhythm.     Heart sounds: Normal heart sounds.  Pulmonary:     Effort: Pulmonary effort is normal.     Breath sounds: Normal breath sounds. No wheezing.  Abdominal:     General: Bowel sounds are normal.     Palpations: Abdomen is soft.  Tenderness: There is abdominal tenderness in the epigastric area. There is no guarding or rebound.  Musculoskeletal:        General: Normal range of motion.     Cervical back: Normal range of motion.  Skin:    General: Skin is warm and dry.  Neurological:     Mental Status: She is alert.     ED Results / Procedures / Treatments   Labs (all labs ordered are listed, but only abnormal results are displayed) Labs Reviewed  COMPREHENSIVE METABOLIC PANEL - Abnormal; Notable for the following components:      Result Value   Glucose, Bld 104 (*)     Total Protein 8.8 (*)    All other components within normal limits  URINALYSIS, ROUTINE W REFLEX MICROSCOPIC - Abnormal; Notable for the following components:   Protein, ur 30 (*)    Bacteria, UA RARE (*)    All other components within normal limits  LIPASE, BLOOD  CBC WITH DIFFERENTIAL/PLATELET  POC URINE PREG, ED    EKG None  Radiology No results found.  Procedures Procedures    Medications Ordered in ED Medications  ondansetron (ZOFRAN) injection 4 mg (4 mg Intravenous Given 12/26/21 0938)  pantoprazole (PROTONIX) injection 40 mg (40 mg Intravenous Given 12/26/21 0939)  alum & mag hydroxide-simeth (MAALOX/MYLANTA) 200-200-20 MG/5ML suspension 30 mL (30 mLs Oral Given 12/26/21 1109)    ED Course/ Medical Decision Making/ A&P                           Medical Decision Making Patient with upper abdominal pain nausea and vomiting, diarrhea.  She is afebrile here with no complaint of fever at home.  She has no abdominal distention and her abdominal exam is benign with no acute abdominal findings.  She does not present with a exam suggesting small bowel obstruction, favor viral gastroenteritis.  Additional possibilities include food poisoning, acute hepatitis flare, pancreatitis.  Labs have been obtained.  Will treat symptoms and reassess once labs are resulted.  On repeat abdominal exam, patient is nontender, no guarding, no distention.  She was given a dose of Maalox and she states her symptoms are significantly improving.  She has been able to tolerate p.o. intake.  With normal labs, suspect probably a viral gastroenteritis.  She will be prescribed home with return precautions.  Will prescribe Zofran and Pepcid for symptom control.  Amount and/or Complexity of Data Reviewed Labs: ordered.    Details: CBC, c-Met, lipase are all negative, specifically WBC count is 7.0, her LFTs are normal, her lipase is 31.  Risk OTC drugs. Prescription drug  management.           Final Clinical Impression(s) / ED Diagnoses Final diagnoses:  Viral gastroenteritis    Rx / DC Orders ED Discharge Orders          Ordered    ondansetron (ZOFRAN) 4 MG tablet  Every 8 hours PRN        12/26/21 1159    famotidine (PEPCID) 20 MG tablet  2 times daily        12/26/21 1159              Evalee Jefferson, PA-C 12/26/21 1200    Godfrey Pick, MD 12/26/21 1536

## 2021-12-26 NOTE — ED Triage Notes (Signed)
Pt states that since yesterday she has had N/V/D and pain described as "burning" in her esophagus and stomach.

## 2021-12-26 NOTE — Discharge Instructions (Signed)
Your lab test and your exam today are reassuring.  I suspect you have a viral GI bug which should run its course.  You are being prescribed medication to help you with your symptoms as they hopefully improve.  Make sure you are drinking plenty of fluids and get some rest.  Get rechecked if your symptoms persist beyond the next 48 hours or if they worsen in any way.

## 2021-12-27 ENCOUNTER — Telehealth: Payer: Self-pay

## 2021-12-27 NOTE — Telephone Encounter (Signed)
Attempted follow up call from [facility] after recent ER visit on 12/26/21. # listed is not available/in service. No other number for her listed.   Francee Nodal RN [employer]

## 2022-01-14 ENCOUNTER — Emergency Department (HOSPITAL_COMMUNITY)
Admission: EM | Admit: 2022-01-14 | Discharge: 2022-01-14 | Disposition: A | Discharge status: Home / Self Care | Payer: Medicaid Other | Attending: Emergency Medicine | Admitting: Emergency Medicine

## 2022-01-14 ENCOUNTER — Other Ambulatory Visit: Payer: Self-pay

## 2022-01-14 ENCOUNTER — Encounter (HOSPITAL_COMMUNITY): Payer: Self-pay | Admitting: *Deleted

## 2022-01-14 DIAGNOSIS — Z20822 Contact with and (suspected) exposure to covid-19: Secondary | ICD-10-CM | POA: Diagnosis not present

## 2022-01-14 DIAGNOSIS — L03211 Cellulitis of face: Secondary | ICD-10-CM | POA: Diagnosis not present

## 2022-01-14 DIAGNOSIS — H9203 Otalgia, bilateral: Secondary | ICD-10-CM | POA: Diagnosis not present

## 2022-01-14 DIAGNOSIS — R42 Dizziness and giddiness: Secondary | ICD-10-CM | POA: Insufficient documentation

## 2022-01-14 DIAGNOSIS — W548XXA Other contact with dog, initial encounter: Secondary | ICD-10-CM | POA: Diagnosis not present

## 2022-01-14 DIAGNOSIS — S00212A Abrasion of left eyelid and periocular area, initial encounter: Secondary | ICD-10-CM | POA: Insufficient documentation

## 2022-01-14 DIAGNOSIS — S0993XA Unspecified injury of face, initial encounter: Secondary | ICD-10-CM | POA: Diagnosis present

## 2022-01-14 LAB — RESP PANEL BY RT-PCR (RSV, FLU A&B, COVID)  RVPGX2
Influenza A by PCR: NEGATIVE
Influenza B by PCR: NEGATIVE
Resp Syncytial Virus by PCR: NEGATIVE
SARS Coronavirus 2 by RT PCR: NEGATIVE

## 2022-01-14 MED ORDER — CEPHALEXIN 500 MG PO CAPS: 500.0000 mg | ORAL_CAPSULE | Freq: Four times a day (QID) | ORAL | 0 refills | Status: AC

## 2022-01-14 MED ORDER — SULFAMETHOXAZOLE-TRIMETHOPRIM 800-160 MG PO TABS: 1.0000 | ORAL_TABLET | Freq: Two times a day (BID) | ORAL | 0 refills | Status: DC

## 2022-01-14 NOTE — ED Triage Notes (Signed)
Pt with dizziness x 2-3 days.  Pt states her dog scratched her left eye earlier today. + diarrhea x 2 days. + nausea

## 2022-01-14 NOTE — Discharge Instructions (Addendum)
Follow up with your Physician for recheck  

## 2022-01-14 NOTE — ED Provider Notes (Signed)
[facility] Provider Note   CSN: [id number] Arrival date & time: 01/14/22  1543     History  Chief Complaint  Patient presents with   Dizziness    Alexandra Floyd is a 56 y.o. female.  Pt is concerned that she is getting cellulitis in her face again.  Pt reports she has had a previous infection.  Pt reports her dog scratched her in the corner of her right eyelid and below her left eyelid.    The history is provided by the patient. No language interpreter was used.  Dizziness Quality:  Lightheadedness Severity:  Moderate Onset quality:  Gradual Duration:  2 days Timing:  Constant Progression:  Worsening Chronicity:  New Relieved by:  Nothing Worsened by:  Nothing Ineffective treatments:  None tried Risk factors: no anemia        Home Medications Prior to Admission medications   Medication Sig Start Date End Date Taking? Authorizing Provider  cephALEXin (KEFLEX) 500 MG capsule Take 1 capsule (500 mg total) by mouth 4 (four) times daily for 10 days. 01/14/22 01/24/22 Yes Caryl Ada K, PA-C  sulfamethoxazole-trimethoprim (BACTRIM DS) 800-160 MG tablet Take 1 tablet by mouth 2 (two) times daily. 01/14/22  Yes Caryl Ada K, PA-C  famotidine (PEPCID) 20 MG tablet Take 1 tablet (20 mg total) by mouth 2 (two) times daily. 12/26/21   Evalee Jefferson, PA-C  HYDROcodone-acetaminophen (NORCO/VICODIN) 5-325 MG tablet Take one tab po q 4 hrs prn pain Patient not taking: Reported on 11/25/2021 08/20/21   Kem Parkinson, PA-C  levothyroxine (SYNTHROID) 25 MCG tablet Take 1 tablet (25 mcg total) by mouth daily at 6 (six) AM. 08/10/21   Soyla Dryer, PA-C  omeprazole (PRILOSEC) 40 MG capsule Take 1 capsule (40 mg total) by mouth daily. 08/10/21   Soyla Dryer, PA-C  ondansetron (ZOFRAN) 4 MG tablet Take 1 tablet (4 mg total) by mouth every 8 (eight) hours as needed for nausea or vomiting. 12/26/21   Evalee Jefferson, PA-C      Allergies    Lasix [furosemide]     Review of Systems   Review of Systems  Neurological:  Positive for dizziness.  All other systems reviewed and are negative.   Physical Exam Updated Vital Signs BP (!) 141/91 (BP Location: Right Arm)   Pulse 79   Temp 97.9 F (36.6 C) (Oral)   Resp 20   Ht 5\' 3"  (1.6 m)   Wt 74.8 kg   SpO2 98%   BMI 29.23 kg/m  Physical Exam Vitals and nursing note reviewed.  Constitutional:      Appearance: She is well-developed.  HENT:     Head: Normocephalic.     Comments: Slight redness below right lower eyelid,      Right Ear: Tympanic membrane normal.     Left Ear: Tympanic membrane normal.     Mouth/Throat:     Mouth: Mucous membranes are moist.  Eyes:     Extraocular Movements: Extraocular movements intact.     Conjunctiva/sclera: Conjunctivae normal.     Pupils: Pupils are equal, round, and reactive to light.     Comments: Abrasion left upper eyelid   Cardiovascular:     Rate and Rhythm: Normal rate.  Pulmonary:     Effort: Pulmonary effort is normal.  Abdominal:     General: There is no distension.  Musculoskeletal:        General: Normal range of motion.     Cervical back: Normal range of motion.  Skin:    General: Skin is warm.  Neurological:     General: No focal deficit present.     Mental Status: She is alert and oriented to person, place, and time.     ED Results / Procedures / Treatments   Labs (all labs ordered are listed, but only abnormal results are displayed) Labs Reviewed  RESP PANEL BY RT-PCR (RSV, FLU A&B, COVID)  RVPGX2    EKG None  Radiology No results found.  Procedures Procedures    Medications Ordered in ED Medications - No data to display  ED Course/ Medical Decision Making/ A&P                           Medical Decision Making Pt concerned that she is getting an infection from scratches to her face from her dogs nails.  Pt reports she has had cellulitis in the past.  Pt reports previous treatment with Bactrim and keflex.      Amount and/or Complexity of Data Reviewed Independent Historian: friend External Data Reviewed: notes.    Details: Previous notes reviewed   Risk Prescription drug management. Risk Details: Pt may have an early infection to scratched area below right eye.  No eye injury or eye infection.  Pt given rx for bactrim and keflex.            Final Clinical Impression(s) / ED Diagnoses Final diagnoses:  Cellulitis, face  Otalgia of both ears    Rx / DC Orders ED Discharge Orders          Ordered    cephALEXin (KEFLEX) 500 MG capsule  4 times daily        01/14/22 1906    sulfamethoxazole-trimethoprim (BACTRIM DS) 800-160 MG tablet  2 times daily        01/14/22 1906           An After Visit Summary was printed and given to the patient.    Osie Cheeks 01/14/22 2337    Rondel Baton, MD 01/16/22 1539

## 2022-02-03 ENCOUNTER — Encounter (HOSPITAL_COMMUNITY): Payer: Self-pay

## 2022-02-03 ENCOUNTER — Emergency Department (HOSPITAL_COMMUNITY): Payer: Medicaid Other

## 2022-02-03 ENCOUNTER — Emergency Department (HOSPITAL_COMMUNITY)
Admission: EM | Admit: 2022-02-03 | Discharge: 2022-02-03 | Disposition: A | Discharge status: Home / Self Care | Payer: Medicaid Other | Attending: Emergency Medicine | Admitting: Emergency Medicine

## 2022-02-03 DIAGNOSIS — M79642 Pain in left hand: Secondary | ICD-10-CM | POA: Diagnosis not present

## 2022-02-03 DIAGNOSIS — F17210 Nicotine dependence, cigarettes, uncomplicated: Secondary | ICD-10-CM | POA: Insufficient documentation

## 2022-02-03 DIAGNOSIS — Z1152 Encounter for screening for COVID-19: Secondary | ICD-10-CM | POA: Diagnosis not present

## 2022-02-03 DIAGNOSIS — J069 Acute upper respiratory infection, unspecified: Secondary | ICD-10-CM | POA: Insufficient documentation

## 2022-02-03 DIAGNOSIS — R0602 Shortness of breath: Secondary | ICD-10-CM | POA: Diagnosis present

## 2022-02-03 LAB — RESP PANEL BY RT-PCR (RSV, FLU A&B, COVID)  RVPGX2
Influenza A by PCR: NEGATIVE
Influenza B by PCR: NEGATIVE
Resp Syncytial Virus by PCR: NEGATIVE
SARS Coronavirus 2 by RT PCR: NEGATIVE

## 2022-02-03 MED ORDER — ALBUTEROL SULFATE HFA 108 (90 BASE) MCG/ACT IN AERS: 1.0000 | INHALATION_SPRAY | Freq: Four times a day (QID) | RESPIRATORY_TRACT | 2 refills | Status: DC | PRN

## 2022-02-03 MED ORDER — ACETAMINOPHEN 325 MG PO TABS
650.0000 mg | ORAL_TABLET | Freq: Once | ORAL | Status: AC
  Administered 2022-02-03: 650 mg via ORAL
  Filled 2022-02-03: qty 2

## 2022-02-03 MED ORDER — PREDNISONE 10 MG PO TABS: 40.0000 mg | ORAL_TABLET | Freq: Every day | ORAL | 0 refills | Status: AC

## 2022-02-03 NOTE — Discharge Instructions (Addendum)
The x-ray of your left hand is normal, there are no broken bones in your hand or fingers.  Your chest x-ray is also reassuring and clear.  You are negative for COVID/flu/RSV.  Provided with prescription for prednisone and albuterol.  Please use as prescribed, complete full course of prednisone.
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
## 2022-02-03 NOTE — ED Triage Notes (Signed)
Pt c/o shortness of breath that began 1 hour ago. Breathing currently regular and unlabored.

## 2022-02-09 ENCOUNTER — Telehealth: Payer: Self-pay

## 2022-02-09 NOTE — Telephone Encounter (Signed)
Attempted to call for follow up from ER visit recently. No answer left message to return call. Did encounter Ms Stepp at [facility] on 02/07/22, she was doing well that day and getting food from the pantry.  Debria Garret RN [facility]

## 2022-03-08 ENCOUNTER — Emergency Department (HOSPITAL_COMMUNITY): Payer: Medicaid Other

## 2022-03-08 ENCOUNTER — Emergency Department (HOSPITAL_COMMUNITY)
Admission: EM | Admit: 2022-03-08 | Discharge: 2022-03-08 | Disposition: A | Discharge status: Home / Self Care | Payer: Medicaid Other | Attending: Emergency Medicine | Admitting: Emergency Medicine

## 2022-03-08 ENCOUNTER — Encounter (HOSPITAL_COMMUNITY): Payer: Self-pay | Admitting: Emergency Medicine

## 2022-03-08 ENCOUNTER — Other Ambulatory Visit: Payer: Self-pay

## 2022-03-08 DIAGNOSIS — Z79899 Other long term (current) drug therapy: Secondary | ICD-10-CM | POA: Diagnosis not present

## 2022-03-08 DIAGNOSIS — S199XXA Unspecified injury of neck, initial encounter: Secondary | ICD-10-CM | POA: Diagnosis not present

## 2022-03-08 DIAGNOSIS — F1193 Opioid use, unspecified with withdrawal: Secondary | ICD-10-CM | POA: Diagnosis not present

## 2022-03-08 DIAGNOSIS — S0990XA Unspecified injury of head, initial encounter: Secondary | ICD-10-CM | POA: Diagnosis present

## 2022-03-08 LAB — CBC
HCT: 42.5 % (ref 36.0–46.0)
Hemoglobin: 13.7 g/dL (ref 12.0–15.0)
MCH: 30.9 pg (ref 26.0–34.0)
MCHC: 32.2 g/dL (ref 30.0–36.0)
MCV: 95.7 fL (ref 80.0–100.0)
Platelets: 306 10*3/uL (ref 150–400)
RBC: 4.44 MIL/uL (ref 3.87–5.11)
RDW: 12.8 % (ref 11.5–15.5)
WBC: 5.8 10*3/uL (ref 4.0–10.5)
nRBC: 0 % (ref 0.0–0.2)

## 2022-03-08 LAB — COMPREHENSIVE METABOLIC PANEL
ALT: 36 U/L (ref 0–44)
AST: 31 U/L (ref 15–41)
Albumin: 4.2 g/dL (ref 3.5–5.0)
Alkaline Phosphatase: 83 U/L (ref 38–126)
Anion gap: 13 (ref 5–15)
BUN: 10 mg/dL (ref 6–20)
CO2: 20 mmol/L — ABNORMAL LOW (ref 22–32)
Calcium: 9.5 mg/dL (ref 8.9–10.3)
Chloride: 104 mmol/L (ref 98–111)
Creatinine, Ser: 0.68 mg/dL (ref 0.44–1.00)
GFR, Estimated: >60 mL/min (ref >60)
Glucose, Bld: 120 mg/dL — ABNORMAL HIGH (ref 70–99)
Potassium: 3.7 mmol/L (ref 3.5–5.1)
Sodium: 137 mmol/L (ref 135–145)
Total Bilirubin: 0.4 mg/dL (ref 0.3–1.2)
Total Protein: 8.6 g/dL — ABNORMAL HIGH (ref 6.5–8.1)

## 2022-03-08 LAB — RAPID URINE DRUG SCREEN, HOSP PERFORMED
Amphetamines: NOT DETECTED
Barbiturates: NOT DETECTED
Benzodiazepines: NOT DETECTED
Cocaine: POSITIVE — AB
Opiates: POSITIVE — AB
Tetrahydrocannabinol: NOT DETECTED

## 2022-03-08 LAB — URINALYSIS, ROUTINE W REFLEX MICROSCOPIC
Bacteria, UA: NONE SEEN
Bilirubin Urine: NEGATIVE
Glucose, UA: NEGATIVE mg/dL
Ketones, ur: NEGATIVE mg/dL
Leukocytes,Ua: NEGATIVE
Nitrite: NEGATIVE
Protein, ur: NEGATIVE mg/dL
Specific Gravity, Urine: 1.011 (ref 1.005–1.030)
pH: 8 (ref 5.0–8.0)

## 2022-03-08 LAB — SALICYLATE LEVEL: Salicylate Lvl: <7 mg/dL — ABNORMAL LOW (ref 7.0–30.0)

## 2022-03-08 LAB — ETHANOL: Alcohol, Ethyl (B): 24 mg/dL — ABNORMAL HIGH (ref <10)

## 2022-03-08 LAB — TROPONIN I (HIGH SENSITIVITY)
Troponin I (High Sensitivity): 4 ng/L (ref <18)
Troponin I (High Sensitivity): 3 ng/L (ref <18)

## 2022-03-08 LAB — ACETAMINOPHEN LEVEL: Acetaminophen (Tylenol), Serum: <10 ug/mL — ABNORMAL LOW (ref 10–30)

## 2022-03-08 LAB — I-STAT BETA HCG BLOOD, ED (MC, WL, AP ONLY): I-stat hCG, quantitative: <5 m[IU]/mL (ref <5)

## 2022-03-08 MED ORDER — ONDANSETRON 4 MG PO TBDP
ORAL_TABLET | ORAL | Status: AC
  Administered 2022-03-08: 4 mg via ORAL
  Filled 2022-03-08: qty 1

## 2022-03-08 MED ORDER — IBUPROFEN 400 MG PO TABS
ORAL_TABLET | ORAL | Status: AC
  Filled 2022-03-08: qty 1

## 2022-03-08 MED ORDER — HYDROXYZINE HCL 10 MG PO TABS
10.0000 mg | ORAL_TABLET | Freq: Once | ORAL | Status: AC
  Administered 2022-03-08: 10 mg via ORAL
  Filled 2022-03-08: qty 1

## 2022-03-08 MED ORDER — LOPERAMIDE HCL 2 MG PO CAPS: 2.0000 mg | ORAL_CAPSULE | ORAL | Status: DC | PRN

## 2022-03-08 MED ORDER — IBUPROFEN 400 MG PO TABS
400.0000 mg | ORAL_TABLET | Freq: Once | ORAL | Status: AC | PRN
  Administered 2022-03-08: 400 mg via ORAL

## 2022-03-08 MED ORDER — HYDROXYZINE HCL 25 MG PO TABS: 25.0000 mg | ORAL_TABLET | Freq: Four times a day (QID) | ORAL | Status: DC | PRN

## 2022-03-08 MED ORDER — ONDANSETRON 4 MG PO TBDP: 4.0000 mg | ORAL_TABLET | Freq: Four times a day (QID) | ORAL | Status: DC | PRN

## 2022-03-08 MED ORDER — ONDANSETRON 4 MG PO TBDP: 4.0000 mg | ORAL_TABLET | Freq: Once | ORAL | Status: AC | PRN

## 2022-03-08 MED ORDER — DICYCLOMINE HCL 20 MG PO TABS: 20.0000 mg | ORAL_TABLET | Freq: Four times a day (QID) | ORAL | Status: DC | PRN

## 2022-03-08 MED ORDER — ONDANSETRON 4 MG PO TBDP
4.0000 mg | ORAL_TABLET | Freq: Once | ORAL | Status: AC
  Administered 2022-03-08: 4 mg via ORAL
  Filled 2022-03-08: qty 1

## 2022-03-08 MED ORDER — METHOCARBAMOL 500 MG PO TABS: 500.0000 mg | ORAL_TABLET | Freq: Three times a day (TID) | ORAL | Status: DC | PRN

## 2022-03-08 MED ORDER — NAPROXEN 250 MG PO TABS: 500.0000 mg | ORAL_TABLET | Freq: Two times a day (BID) | ORAL | Status: DC | PRN
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
## 2022-03-08 NOTE — ED Notes (Signed)
Pt aware of needing to give a urine sample.
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
## 2022-03-08 NOTE — ED Notes (Signed)
Pt left to go out to smoke a cigarette

## 2022-04-19 ENCOUNTER — Encounter (HOSPITAL_COMMUNITY): Payer: Self-pay

## 2022-04-19 ENCOUNTER — Other Ambulatory Visit: Payer: Self-pay

## 2022-04-19 ENCOUNTER — Emergency Department (HOSPITAL_COMMUNITY)
Admission: EM | Admit: 2022-04-19 | Discharge: 2022-04-19 | Disposition: A | Discharge status: Home / Self Care | Payer: Medicaid Other | Attending: Emergency Medicine | Admitting: Emergency Medicine

## 2022-04-19 DIAGNOSIS — F17210 Nicotine dependence, cigarettes, uncomplicated: Secondary | ICD-10-CM | POA: Diagnosis not present

## 2022-04-19 DIAGNOSIS — T7840XA Allergy, unspecified, initial encounter: Secondary | ICD-10-CM | POA: Insufficient documentation

## 2022-04-19 DIAGNOSIS — M25512 Pain in left shoulder: Secondary | ICD-10-CM | POA: Diagnosis not present

## 2022-04-19 MED ORDER — NAPROXEN 500 MG PO TABS: 500.0000 mg | ORAL_TABLET | Freq: Two times a day (BID) | ORAL | 0 refills | Status: DC

## 2022-04-19 MED ORDER — DEXAMETHASONE SODIUM PHOSPHATE 10 MG/ML IJ SOLN
10.0000 mg | Freq: Once | INTRAMUSCULAR | Status: AC
  Administered 2022-04-19: 10 mg via INTRAMUSCULAR
  Filled 2022-04-19: qty 1

## 2022-04-19 NOTE — Discharge Instructions (Addendum)
You were evaluated in the Emergency Department and after careful evaluation, we did not find any emergent condition requiring admission or further testing in the hospital.  Your exam/testing today was overall reassuring.  Rash may be due to an allergic reaction.  We gave you a steroid shot here in the emergency department which should help.  Use over-the-counter Benadryl as needed for any lingering itching or rash.  Use the Naprosyn twice daily for your shoulder discomfort.  Rest the shoulder for the next several days.  Please return to the Emergency Department if you experience any worsening of your condition.  Thank you for allowing [place] to be a part of your care.

## 2022-04-19 NOTE — ED Triage Notes (Signed)
Pt states that she is having an allergic reaction that started this morning. Does not know what from. Pt is complaining of SOB, red spots, itching.

## 2022-04-19 NOTE — ED Provider Notes (Signed)
[facility] Emergency Department Provider Note MRN:  [id number]  Arrival date & time: 04/19/22     Chief Complaint   Allergic Reaction   History of Present Illness   Alexandra Floyd is a 57 y.o. year-old female with a history of hepatitis C presenting to the ED with chief complaint of logic reaction.  Rash to the face and arms for the past several hours.  Also with left shoulder pain for a few days.  Moved a lot of furniture the day before the shoulder started hurting.  No fever.  No other pain.  Review of Systems  A thorough review of systems was obtained and all systems are negative except as noted in the HPI and PMH.   Patient's Health History    Past Medical History:  Diagnosis Date   Anxiety    Cellulitis    Depression    Heart murmur    Hep C w/ coma, chronic    MRSA cellulitis     Past Surgical History:  Procedure Laterality Date   arm sx     CHOLECYSTECTOMY      History reviewed. No pertinent family history.  Social History   Socioeconomic History   Marital status: Legally Separated    Spouse name: Not on file   Number of children: Not on file   Years of education: Not on file   Highest education level: Not on file  Occupational History   Not on file  Tobacco Use   Smoking status: Every Day    Packs/day: .5    Types: Cigarettes   Smokeless tobacco: Never  Vaping Use   Vaping Use: Never used  Substance and Sexual Activity   Alcohol use: Yes    Alcohol/week: 12.0 standard drinks of alcohol    Types: 12 Cans of beer per week   Drug use: Not Currently    Types: Methamphetamines, "Crack" cocaine, Heroin   Sexual activity: Yes  Other Topics Concern   Not on file  Social History Narrative   Not on file   Social Determinants of Health   Financial Resource Strain: Not on file  Food Insecurity: Not on file  Transportation Needs: Not on file  Physical Activity: Not on file  Stress: Not on file  Social Connections: Not on  file  Intimate Partner Violence: Not on file     Physical Exam   Vitals:   04/19/22 0630  BP: 115/84  Pulse: 77  Resp: 18  Temp: 98 F (36.7 C)  SpO2: 99%    CONSTITUTIONAL: Chronically ill-appearing, anxious and fidgety NEURO/PSYCH:  Alert and oriented x 3, no focal deficits EYES:  eyes equal and reactive ENT/NECK:  no LAD, no JVD CARDIO: Regular rate, well-perfused, normal S1 and S2 PULM:  CTAB no wheezing or rhonchi GI/GU:  non-distended, non-tender MSK/SPINE:  No gross deformities, no edema SKIN: Scattered erythematous rash to the face, arms   *Additional and/or pertinent findings included in MDM below  Diagnostic and Interventional Summary    EKG Interpretation  Date/Time:    Ventricular Rate:    PR Interval:    QRS Duration:   QT Interval:    QTC Calculation:   R Axis:     Text Interpretation:         Labs Reviewed - No data to display  No orders to display    Medications  dexamethasone (DECADRON) injection 10 mg (has no administration in time range)     Procedures  /  Critical  Care Procedures  ED Course and Medical Decision Making  Initial Impression and Ddx Shoulder pain favored to be rotator cuff sprain due to moving furniture recently.  Her range of motion is largely intact.  The arm has no erythema or edema or increased warmth.  Neurovascularly intact.  Nothing to suggest infection or DVT or other emergency.  No trauma, no real indication for imaging at this time.  Rash does not appear emergent in any way.  Suspect mild allergic reaction.  Will provide dose of steroid and advised Benadryl at home.  Currently without any wheezing or signs of anaphylaxis or angioedema of any kind.  Patient very fidgety, admits to using a friend's prescription Adderall to try to lose weight recently.  Strongly encouraged that she stop doing this.  She denies any other illicit drug use.   Past medical/surgical history that increases complexity of ED encounter:  Remote history of IV drug use, endorsing sobriety for the past 9 years  Interpretation of Diagnostics Laboratory and/or imaging options to aid in the diagnosis/care of the patient were considered.  After careful history and physical examination, it was determined that there was no indication for diagnostics at this time.  Patient Reassessment and Ultimate Disposition/Management     Discharge  Patient management required discussion with the following services or consulting groups:  None  Complexity of Problems Addressed Acute complicated illness or Injury  Additional Data Reviewed and Analyzed Further history obtained from: Further history from spouse/family member  Additional Factors Impacting ED Encounter Risk Prescriptions  Barth Kirks. Sedonia Small, [place] [email or web address]  Final Clinical Impressions(s) / ED Diagnoses     ICD-10-CM   1. Allergic reaction, initial encounter  T78.40XA     2. Acute pain of left shoulder  M25.512       ED Discharge Orders          Ordered    naproxen (NAPROSYN) 500 MG tablet  2 times daily        04/19/22 0645             Discharge Instructions Discussed with and Provided to Patient:    Discharge Instructions      You were evaluated in the Emergency Department and after careful evaluation, we did not find any emergent condition requiring admission or further testing in the hospital.  Your exam/testing today was overall reassuring.  Rash may be due to an allergic reaction.  We gave you a steroid shot here in the emergency department which should help.  Use over-the-counter Benadryl as needed for any lingering itching or rash.  Use the Naprosyn twice daily for your shoulder discomfort.  Rest the shoulder for the next several days.  Please return to the Emergency Department if you experience any worsening of your condition.  Thank you for allowing [place] to be a part of your  care.       [name], MD 04/19/22 [phone]

## 2022-04-19 NOTE — ED Notes (Signed)
ED Provider at bedside. 

## 2022-04-25 NOTE — Congregational Nurse Program (Signed)
Completed a hospital f/u call as a result of the pt being seen at the ED on 3.19.24     I Successfully spoke with patient on first attempt of phone call.     Pt states she currently has no PCP and is aware of her high dependency of the emergency room when conditions arise and would like to work on getting a provider  PLAN_ -Pt was made aware that her enrollment in the [place] had expired 08/2021 and that she may be now a candidate to apply for Medicaid to assist with her medical care and obtaining a PCP.     Pt was re-educated on the use of Urgent vs Emergency room utilizations and how to prevent  Pt was encouraged to make an appointment with [facility] by way of scheduling with me to to so .  Pt agreed to so.  All documents needed for the appointment was reviewed.   A [facility] RENEWAL enrollment appointment was scheduled for Tues, April 26, 2022 at 11am  Pt states she understood and call ended

## 2022-04-25 NOTE — Congregational Nurse Program (Signed)
  Dept: [phone]   Congregational Nurse Program Note  Date of Encounter: 04/25/2022  Past Medical History: Past Medical History:  Diagnosis Date   Anxiety    Cellulitis    Depression    Heart murmur    Hep C w/ coma, chronic    MRSA cellulitis     Encounter Details:  CNP Questionnaire - 04/25/22 1838       Questionnaire   Ask client: Do you give verbal consent for me to treat you today? N/A    Student Assistance N/A    Location Patient [place]    Visit Setting with Client Phone/Text/Email   phone call   Patient Status Unknown    Insurance Uninsured ([place] [facility]/Self-Pay/Medicaid Family Planning)    Insurance/Financial Assistance Referral [place] [facility]    Medication N/A    Medical Provider No    Screening Referrals Made N/A    Medical Referrals Made N/A    Medical Appointment Made N/A    Recently w/o PCP, now 1st time PCP visit completed due to CNs referral or appointment made N/A    Food Have Food Insecurities;Referred to Food Pantry   on-site [employer] food market   Transportation N/A    Housing/Utilities N/A    Chiropractor N/A    Interventions Advocate/Support;Case Management    Abnormal to Normal Screening Since Last CN Visit N/A    Screenings CN Performed N/A    Sent Client to Lab for: N/A    Did client attend any of the following based off CNs referral or appointments made? N/A    ED Visit Averted N/A    Life-Saving Intervention Made N/A

## 2022-04-26 NOTE — Congregational Nurse Program (Signed)
Pt attended [employer] on today as a result of my hospital follow up call, to complete her renewal enrollment back into the [place] and to complete a Medicaid application as well.  Chief Reasons Needed for  PCP (See visit information) Management of thyroid condition, in order to get access to her medications for thyroid and to get re-established with a PCP  .   Socio-determinants health needs identified: Medication Assistance resource needed-  Completed Human resources officer on today   Food Assistance needed- Shopped at the onsite food market at [employer]-  PHQ= 7 (low) however pt expressed sometimes she is sad and emotional over the death of her "stepson" (boyfriend's son) - Pt was provided information and contact information to "[facility]" therapy services program     Plan - MedAssist Application forwarded to [place] to to expedite by email on today  -[employer] application completed and forwarded to [employer].    Application placed on hold until  [facility] Decision letter is obtained  -Pt will receive continous nurse case management upon completion of first medical appointment and thereafter if any needs shall arise  -Applied for Medicaid on today during visit  -Re-educated on the use PCP visit versus urgent care and emergency room   -Scheduled Appointment for first medical visit for the  [facility] on Wed, April 27, 2022 @ 10:30 am

## 2022-04-27 ENCOUNTER — Encounter: Payer: Self-pay | Admitting: Physician Assistant

## 2022-04-27 ENCOUNTER — Ambulatory Visit: Payer: Self-pay | Admitting: Physician Assistant

## 2022-04-27 VITALS — BP 114/82 | HR 55 | Temp 97.3°F | Ht 63.0 in | Wt 172.5 lb

## 2022-04-27 DIAGNOSIS — Z7689 Persons encountering health services in other specified circumstances: Secondary | ICD-10-CM

## 2022-04-27 DIAGNOSIS — E785 Hyperlipidemia, unspecified: Secondary | ICD-10-CM

## 2022-04-27 DIAGNOSIS — M25512 Pain in left shoulder: Secondary | ICD-10-CM

## 2022-04-27 DIAGNOSIS — F17200 Nicotine dependence, unspecified, uncomplicated: Secondary | ICD-10-CM

## 2022-04-27 DIAGNOSIS — E039 Hypothyroidism, unspecified: Secondary | ICD-10-CM

## 2022-04-27 DIAGNOSIS — F1910 Other psychoactive substance abuse, uncomplicated: Secondary | ICD-10-CM

## 2022-04-27 DIAGNOSIS — K219 Gastro-esophageal reflux disease without esophagitis: Secondary | ICD-10-CM

## 2022-04-27 MED ORDER — DEXLANSOPRAZOLE 60 MG PO CPDR: 60.0000 mg | DELAYED_RELEASE_CAPSULE | Freq: Every day | ORAL | 0 refills | Status: DC

## 2022-04-27 MED ORDER — NAPROXEN 500 MG PO TABS: 500.0000 mg | ORAL_TABLET | Freq: Two times a day (BID) | ORAL | 0 refills | Status: DC

## 2022-04-27 NOTE — Progress Notes (Signed)
BP 114/82   Pulse (!) 55   Temp (!) 97.3 F (36.3 C)   SpO2 92%    Subjective:    Patient ID: Alexandra Floyd, female    DOB: 12/06/1965, 57 y.o.   MRN: [id number]  HPI: Alexandra Floyd is a 57 y.o. female presenting on 04/27/2022 for New Patient (Initial Visit) (Pt is here to re- establish care. Pt states she only went to ER PRN. Pt states she has been taking care of her boyfriend that has a trachea from throat cancer. Pt states she ran out of all her medications about 6-7 months ago.) and Shoulder Pain (Pt states she was moving furniture out of her house about 1-2 weeks ago and thinks she pulled her L shoulder out of her socket. Pt states she was rx naproxen  at the ER, but had not had the money to get it. Pt states it is painful to lie on either side, to lift her L arm.)   HPI   Chief Complaint  Patient presents with   New Patient (Initial Visit)    Pt is here to re- establish care. Pt states she has been going to ER PRN. Pt states she has been taking care of her boyfriend that has a trach from throat cancer. Pt states she ran out of all her medications about 6-7 months ago.   Shoulder Pain    Pt states she was moving furniture out of her house about 1-2 weeks ago and thinks she pulled her L shoulder out of her socket. Pt states she was rx naproxen  at the ER, but had not had the money to get it. Pt states it is painful to lie on either side, to lift her L arm.      Pt last seen here 08/17/21.   At that time, she was expecting to get insurance in a month.  Pt was seen here 2 times July 2023 and not since spetember 2022 before that  Pt has been seen in ER almost every month since her last OV here.   She was a no-show for her 10/12/21 follow up appointment in this office.      Pt was seen in ER 04/19/22 for the shoulder pain that she is having today.    Pt says she was moving her furniture because they bought all new furniture and were thus needing to move the furniture they have now.        She says her breathing has been good  She says her Reflux is bad.  She says she Vomits frequently now that she isn't on any ppi meds.     Relevant past medical, surgical, family and social history reviewed and updated as indicated. Interim medical history since our last visit reviewed. Allergies and medications reviewed and updated.   CURRENT MEDS: none  Review of Systems  Per HPI unless specifically indicated above     Objective:    BP 114/82   Pulse (!) 55   Temp (!) 97.3 F (36.3 C)   SpO2 92%   Wt Readings from Last 3 Encounters:  04/19/22 150 lb (68 kg)  03/08/22 165 lb (74.8 kg)  02/03/22 165 lb (74.8 kg)    Last Weight  Most recent update: 04/27/2022 10:29 AM    Weight  78.2 kg (172 lb 8 oz)            Body mass index is 30.56 kg/m.    Physical Exam Constitutional:  General: She is not in acute distress.    Appearance: She is not toxic-appearing.  HENT:     Head: Normocephalic and atraumatic.     Right Ear: External ear normal. There is impacted cerumen.     Left Ear: External ear normal. There is impacted cerumen.  Eyes:     Extraocular Movements: Extraocular movements intact.     Conjunctiva/sclera: Conjunctivae normal.     Pupils: Pupils are equal, round, and reactive to light.  Cardiovascular:     Rate and Rhythm: Normal rate and regular rhythm.  Pulmonary:     Effort: Pulmonary effort is normal. No respiratory distress.     Breath sounds: Normal breath sounds. No stridor. No wheezing, rhonchi or rales.  Abdominal:     General: Bowel sounds are normal.     Palpations: Abdomen is soft. There is no mass.     Tenderness: There is no abdominal tenderness. There is no guarding or rebound.  Musculoskeletal:     Right shoulder: Normal.     Left shoulder: Tenderness present. No swelling, deformity or bony tenderness. Normal range of motion. Normal pulse.     Cervical back: Neck supple.     Right lower leg: No edema.     Left lower leg:  No edema.     Comments: Very mild non-point tenderness left shoulder  Lymphadenopathy:     Cervical: No cervical adenopathy.  Skin:    General: Skin is warm and dry.  Neurological:     Mental Status: She is alert and oriented to person, place, and time.     Motor: No weakness or tremor.  Psychiatric:        Attention and Perception: Attention normal.        Mood and Affect: Mood normal.        Speech: Speech normal.        Behavior: Behavior normal. Behavior is cooperative.     Comments: Very pleasant and engaged            Assessment & Plan:     Encounter Diagnoses  Name Primary?   Encounter to establish care Yes   Gastroesophageal reflux disease, unspecified whether esophagitis present    Acute pain of left shoulder    Tobacco use disorder    Hypothyroidism, unspecified type    Hyperlipidemia, unspecified hyperlipidemia type    Polysubstance abuse ([place])      -pt was given Samples naproxen and dexilant.  Discussed with pt that she needs to take the naproxen with food as it can worsen her GI issues. -recent labs reviewed.  She will need updated tsh and lipids -discussed follow up plan.  She will be scheduled to F/u 4 wk by which time she will have an answer on her medicaid application.  labs will be updated then.  She is to contact office prior to that time if needed.  She is in agreement with plan

## 2022-05-06 ENCOUNTER — Ambulatory Visit
Admission: EM | Admit: 2022-05-06 | Discharge: 2022-05-06 | Disposition: A | Discharge status: Home / Self Care | Payer: Medicaid Other | Attending: Physician Assistant | Admitting: Physician Assistant

## 2022-05-06 ENCOUNTER — Ambulatory Visit (INDEPENDENT_AMBULATORY_CARE_PROVIDER_SITE_OTHER): Payer: Medicaid Other

## 2022-05-06 ENCOUNTER — Encounter: Payer: Self-pay | Admitting: Emergency Medicine

## 2022-05-06 DIAGNOSIS — L089 Local infection of the skin and subcutaneous tissue, unspecified: Secondary | ICD-10-CM | POA: Diagnosis not present

## 2022-05-06 DIAGNOSIS — Z8614 Personal history of Methicillin resistant Staphylococcus aureus infection: Secondary | ICD-10-CM

## 2022-05-06 DIAGNOSIS — M79672 Pain in left foot: Secondary | ICD-10-CM | POA: Diagnosis not present

## 2022-05-06 MED ORDER — DOXYCYCLINE HYCLATE 100 MG PO CAPS: 100.0000 mg | ORAL_CAPSULE | Freq: Two times a day (BID) | ORAL | 0 refills | Status: DC

## 2022-05-06 MED ORDER — IBUPROFEN 800 MG PO TABS
800.0000 mg | ORAL_TABLET | Freq: Once | ORAL | Status: AC
  Administered 2022-05-06: 800 mg via ORAL

## 2022-05-06 NOTE — Discharge Instructions (Signed)
Start doxycycline to cover for infection.  Stay out of the sun while on this medication.  Keep your area clean with soap and water.  If this continues to spread, becomes larger, becomes painful, you develop other symptoms such as fever, nausea, vomiting you should be seen immediately.  Your x-ray did not show any obvious large foreign body.  As we discussed, since this has been there for several weeks I am not comfortable taking it out.  I recommend a follow-up with podiatry; call to schedule an appointment.

## 2022-05-06 NOTE — ED Provider Notes (Signed)
[facility]    CSN: [id number] Arrival date & time: 05/06/22  1749      History   Chief Complaint No chief complaint on file.   HPI Alexandra Floyd is a 57 y.o. female.   Patient presents today with several concerns.  Her primary concern today is a 2-week history of glass in her left foot.  Reports that she has had her boyfriend attempt to remove it but they have been unsuccessful.  She reports pain is rated 8 on a 0-10 pain scale, described as sharp, no aggravating relieving factors identified.  She discover that she is up-to-date on her tetanus (this was last given 08/03/2021).  She is having difficulty ambulating as a result of pain.  She has not taken any over-the-counter pain medications for symptom management.  In addition, she reports a several day history of enlarging lesion on her right forearm.  She does have a history of MRSA cellulitis and is concerned that this could be the start of an infection.  She denies any known injury or insect bites.  She has not had any recent antibiotics.  She has not taken anything to help manage her symptoms.  Denies any fever, nausea, vomiting.    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      Home Medications    Prior to Admission medications   Medication Sig Start Date End Date Taking? Authorizing Provider  doxycycline (VIBRAMYCIN) 100 MG capsule Take 1 capsule (100 mg total) by mouth 2 (two) times daily. 05/06/22  Yes Dymphna Wadley K, PA-C  albuterol (VENTOLIN HFA) 108 (90 Base) MCG/ACT inhaler Inhale 1-2 puffs into the lungs every 6 (six) hours as needed for wheezing or shortness of breath. Patient not taking: Reported on 04/27/2022 02/03/22   Jeannie FendMurphy, Laura A, PA-C  HYDROcodone-acetaminophen (NORCO/VICODIN) 5-325 MG tablet Take one tab po q 4 hrs prn pain Patient not taking: Reported on 11/25/2021 08/20/21   Pauline Ausriplett, Tammy, PA-C  levothyroxine (SYNTHROID) 25 MCG tablet Take 1 tablet (25 mcg total) by mouth daily at 6 (six) AM. Patient not taking: Reported on 04/27/2022 08/10/21   Jacquelin HawkingMcElroy, Shannon, PA-C  naproxen (NAPROSYN) 500 MG tablet Take 1 tablet (500 mg total) by mouth 2 (two) times daily. 04/27/22   Jacquelin HawkingMcElroy, Shannon, PA-C    Family History History reviewed. No pertinent family history.  Social History Social History   Tobacco Use   Smoking status: Every Day    Packs/day: .5    Types: Cigarettes   Smokeless tobacco: Never  Vaping Use   Vaping Use: Never used  Substance Use Topics   Alcohol use: Not Currently  Alcohol/week: 12.0 standard drinks of alcohol    Types: 12 Cans of beer per week   Drug use: Not Currently    Types: Methamphetamines, "Crack" cocaine, Heroin     Allergies   Lasix [furosemide]   Review of Systems Review of Systems  Constitutional:  Positive for activity change. Negative for appetite change, fatigue and fever.  Gastrointestinal:  Negative for abdominal pain, diarrhea, nausea and vomiting.  Musculoskeletal:  Positive for gait problem and myalgias. Negative for arthralgias.  Skin:  Positive for color change and wound.  Neurological:  Negative for weakness and numbness.     Physical Exam Triage  Vital Signs ED Triage Vitals  Enc Vitals Group     BP 05/06/22 1802 (!) 144/87     Pulse Rate 05/06/22 1802 99     Resp 05/06/22 1802 18     Temp 05/06/22 1802 97.9 F (36.6 C)     Temp Source 05/06/22 1802 Oral     SpO2 05/06/22 1802 95 %     Weight --      Height --      Head Circumference --      Peak Flow --      Pain Score 05/06/22 1803 8     Pain Loc --      Pain Edu? --      Excl. in GC? --    No data found.  Updated Vital Signs BP (!) 144/87 (BP Location: Right Arm)   Pulse 99   Temp 97.9 F (36.6 C) (Oral)   Resp 18   SpO2 95%   Visual Acuity Right Eye Distance:   Left Eye Distance:   Bilateral Distance:    Right Eye Near:   Left Eye Near:    Bilateral Near:     Physical Exam Vitals reviewed.  Constitutional:      General: She is awake. She is not in acute distress.    Appearance: Normal appearance. She is well-developed. She is not ill-appearing.     Comments: Appears stated age in no acute distress sitting comfortably in exam room  HENT:     Head: Normocephalic and atraumatic.  Cardiovascular:     Rate and Rhythm: Normal rate and regular rhythm.     Heart sounds: Normal heart sounds, S1 normal and S2 normal. No murmur heard. Pulmonary:     Effort: Pulmonary effort is normal.     Breath sounds: Normal breath sounds. No wheezing, rhonchi or rales.     Comments: Clear to auscultation bilaterally Abdominal:     Palpations: Abdomen is soft.     Tenderness: There is no abdominal tenderness.  Musculoskeletal:       Feet:  Skin:    Findings: Lesion present. No wound.          Comments: 2 cm x 1 cm erythematous nodule.  Nodule is well-defined and mobile.  No significant fluctuance.  No bleeding or drainage noted.  Psychiatric:        Behavior: Behavior is cooperative.      UC Treatments / Results  Labs (all labs ordered are listed, but only abnormal results are displayed) Labs Reviewed - No data to display  EKG   Radiology DG Foot  Complete Left  Result Date: 05/06/2022 CLINICAL DATA:  Glass foreign body stuck in foot EXAM: LEFT FOOT - COMPLETE 3+ VIEW COMPARISON:  None Available. FINDINGS: There is no evidence of fracture or dislocation. There is hallux valgus and moderate degenerative change of the  first metatarsophalangeal joint. There is a healed third metatarsal fracture. No foreign body identified. Soft tissues are unremarkable. IMPRESSION: 1. No foreign body identified. 2. Hallux valgus and moderate degenerative change of the first metatarsophalangeal joint. Electronically Signed   By: Darliss Cheney M.D.   On: 05/06/2022 18:44    Procedures Procedures (including critical care time)  Medications Ordered in UC Medications  ibuprofen (ADVIL) tablet 800 mg (800 mg Oral Given 05/06/22 1823)    Initial Impression / Assessment and Plan / UC Course  I have reviewed the triage vital signs and the nursing notes.  Pertinent labs & imaging results that were available during my care of the patient were reviewed by me and considered in my medical decision making (see chart for details).     Patient is well-appearing, afebrile, nontoxic, nontachycardic.  She does have a lesion that appears to be infected on her right forearm but this is not amenable to I&D including today.  Will start antibiotics and treat for MRSA given her history of this.  She was started on doxycycline 100 mg twice daily for 10 days.  Recommend that she avoid prolonged sun exposure while on this medication due to associated photosensitivity.  She is to keep this area clean with soap and water.  Recommended that she monitor the size of lesion and if this enlarges or she develops any additional symptoms such as increasing pain, fever, nausea, vomiting she is to return for reevaluation.  X-ray was obtained given ongoing foot pain which showed no acute osseous abnormality or radiopaque foreign body.  Discussed that given has been several weeks and she has significant  callused skin covering the area of pain I am not comfortable taking for the foreign body.  I did recommend that she follow-up with podiatry for further evaluation and management.  She is agreeable and was given contact information for local provider with instruction to call to schedule an appointment.  Discussed that if she has any worsening or changing symptoms including increasing pain, redness or swelling in her foot, fever, nausea, vomiting should be seen immediately.  Strict return precautions given.  Final Clinical Impressions(s) / UC Diagnoses   Final diagnoses:  Skin infection  History of MRSA infection  Left foot pain     Discharge Instructions      Start doxycycline to cover for infection.  Stay out of the sun while on this medication.  Keep your area clean with soap and water.  If this continues to spread, becomes larger, becomes painful, you develop other symptoms such as fever, nausea, vomiting you should be seen immediately.  Your x-ray did not show any obvious large foreign body.  As we discussed, since this has been there for several weeks I am not comfortable taking it out.  I recommend a follow-up with podiatry; call to schedule an appointment.     ED Prescriptions     Medication Sig Dispense Auth. Provider   doxycycline (VIBRAMYCIN) 100 MG capsule Take 1 capsule (100 mg total) by mouth 2 (two) times daily. 20 capsule Samhita Kretsch, Noberto Retort, PA-C      PDMP not reviewed this encounter.   Jeani Hawking, PA-C 05/06/22 1908

## 2022-05-06 NOTE — ED Triage Notes (Signed)
States she has a pice of glass stuck in left foot x 1 week.  States she has a boil on right forearm x 2 days.

## 2022-05-11 NOTE — Congregational Nurse Program (Signed)
Pt attended onsite food market at [employer] and requested to shop for her family of 3.    In addition to her visit, the pt also followed up with me regarding her Medicaid status and reported that she recently received a Medicaid enrollment letter of approval.    States she needs assistance with transitioning to a new PCP that will now accept her Medicaid insurance.  Plan -Reviewed steps of transition of care to Medicaid with the patient to ensure no gaps to occur with medical, pharmacy or dental care by supplying contact information that will assist her in the future if needed  -Assisted pt with contacting Medicaid Contact Center to assist with locating a PCP.  Pt choose [facility], as her new Medicaid provider.  -Assisted pt with initial call to setup new patient appointment  at [facility].  Her appointment was scheduled for May 12, 2022 at 1:00pm  -Pt shopped for food on today prior to completing her visit.   Pt was grateful for the services she has received with [facility] and then left clinic

## 2022-05-11 NOTE — Congregational Nurse Program (Signed)
sent text message reminder related to transition of medicaid process, obtaining her medications, medical and dental care that was included as a reminder about pt's newly scheduled PCP and upcoming appointment for Thurs 4.11.24 @ 1:00pm with [facility]

## 2022-05-12 ENCOUNTER — Ambulatory Visit: Payer: Medicaid Other | Admitting: Family Medicine

## 2022-05-16 NOTE — Congregational Nurse Program (Signed)
  Dept: [phone]   Congregational Nurse Program Note  Date of Encounter: 05/16/2022  Past Medical History: Past Medical History:  Diagnosis Date   Anxiety    Cellulitis    Depression    Heart murmur    Hep C w/ coma, chronic    MRSA cellulitis     Encounter Details:  CNP Questionnaire - 05/16/22 1100       Questionnaire   Ask client: Do you give verbal consent for me to treat you today? Yes    Student Assistance N/A    Location Patient Information systems manager, [place]    Visit Setting with Client Other    Patient Status Unknown    Insurance Medicaid    Insurance/Financial Assistance Referral N/A    Medication N/A    Medical Provider Yes    Screening Referrals Made N/A    Medical Referrals Made N/A    Medical Appointment Made N/A    Recently w/o PCP, now 1st time PCP visit completed due to CNs referral or appointment made N/A    Food N/A    Transportation N/A    Housing/Utilities N/A    Interpersonal Safety N/A    Interventions Advocate/Support;Counsel    Abnormal to Normal Screening Since Last CN Visit N/A    Screenings CN Performed Blood Pressure    Sent Client to Lab for: N/A    Did client attend any of the following based off CNs referral or appointments made? N/A    ED Visit Averted N/A    Life-Saving Intervention Made N/A              Encounter at [facility]. She is recently approved for medicaid and is to establish with [facility]. She is in today for the food pantry. Requested to have her blood pressure checked. Otherwise she has no other needs.  Encouraged her to follow up with her primary care provider and for non-emergent needs use Urgent Care if needed in lieu of Medicaid.  Francee Nodal RN [employer]

## 2022-05-25 ENCOUNTER — Ambulatory Visit: Payer: Self-pay | Admitting: Physician Assistant

## 2022-06-14 ENCOUNTER — Ambulatory Visit: Payer: Commercial Managed Care - HMO | Admitting: Internal Medicine

## 2022-06-20 IMAGING — DX DG CHEST 2V
2 series · 2 of 2 positions shown · non-contrast
Comparison: September 20, 2018.

CLINICAL DATA: Chest pain.

EXAM:
CHEST - 2 VIEW

[chest pa]
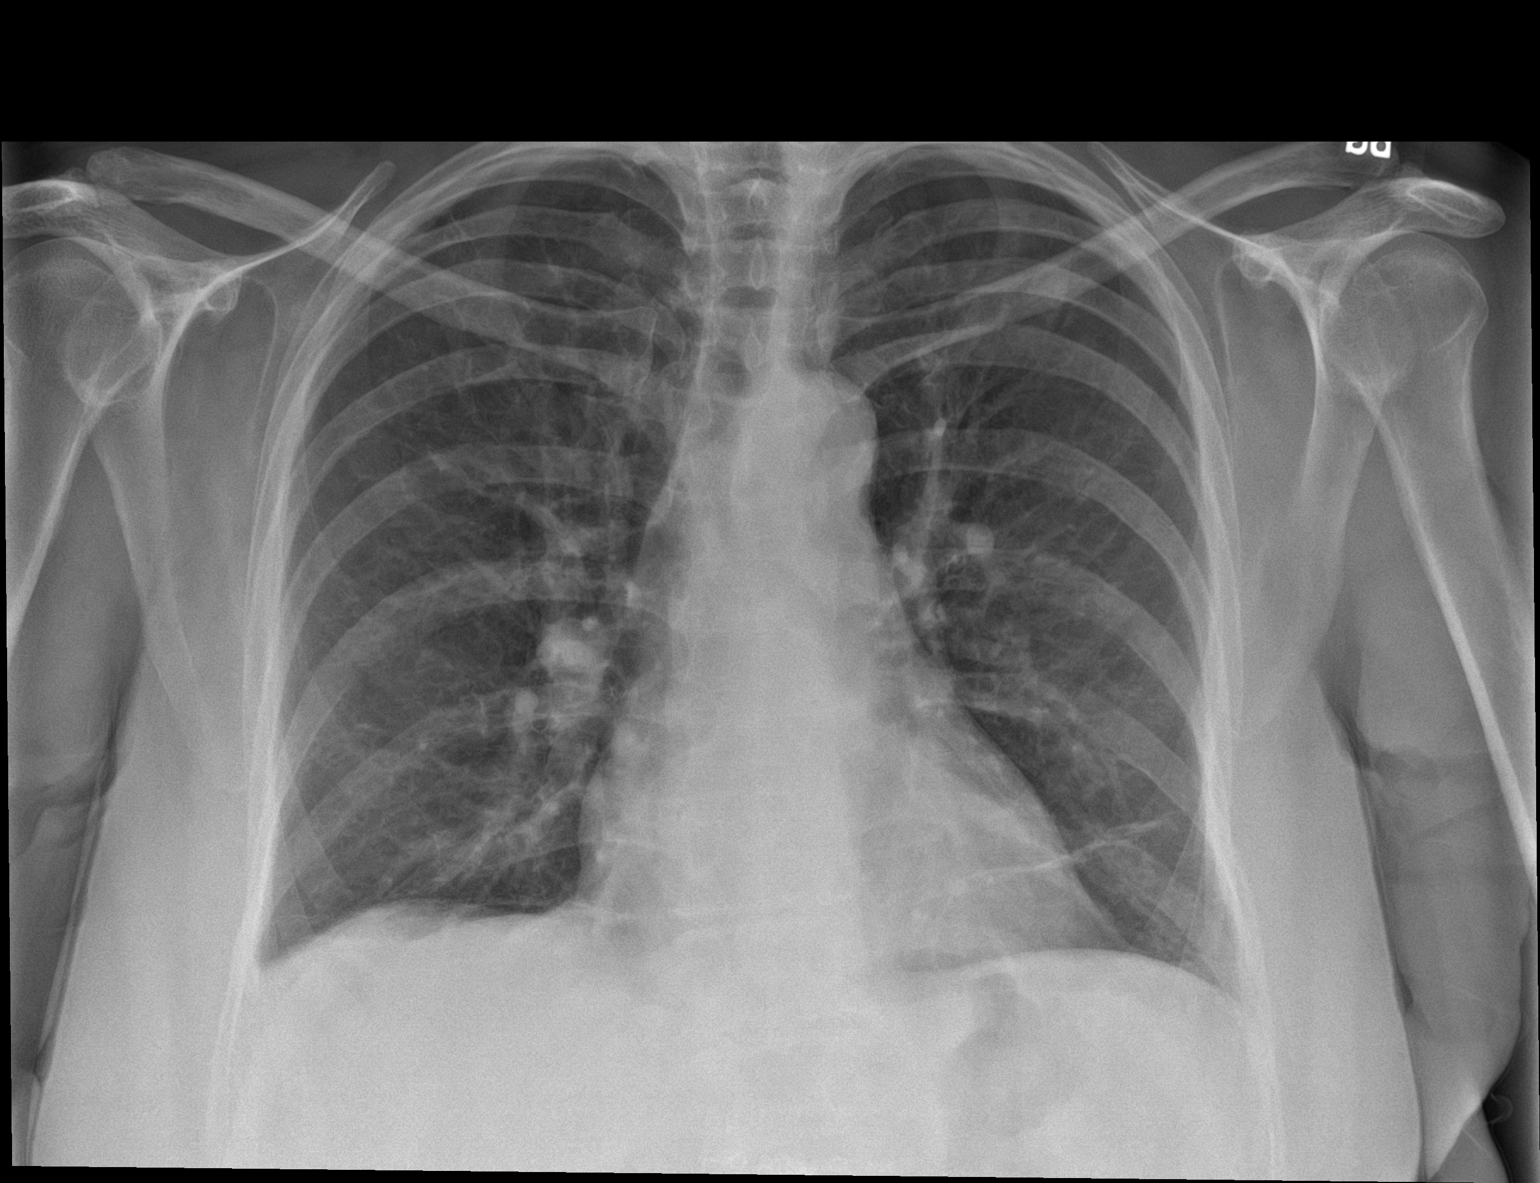

[chest lat]
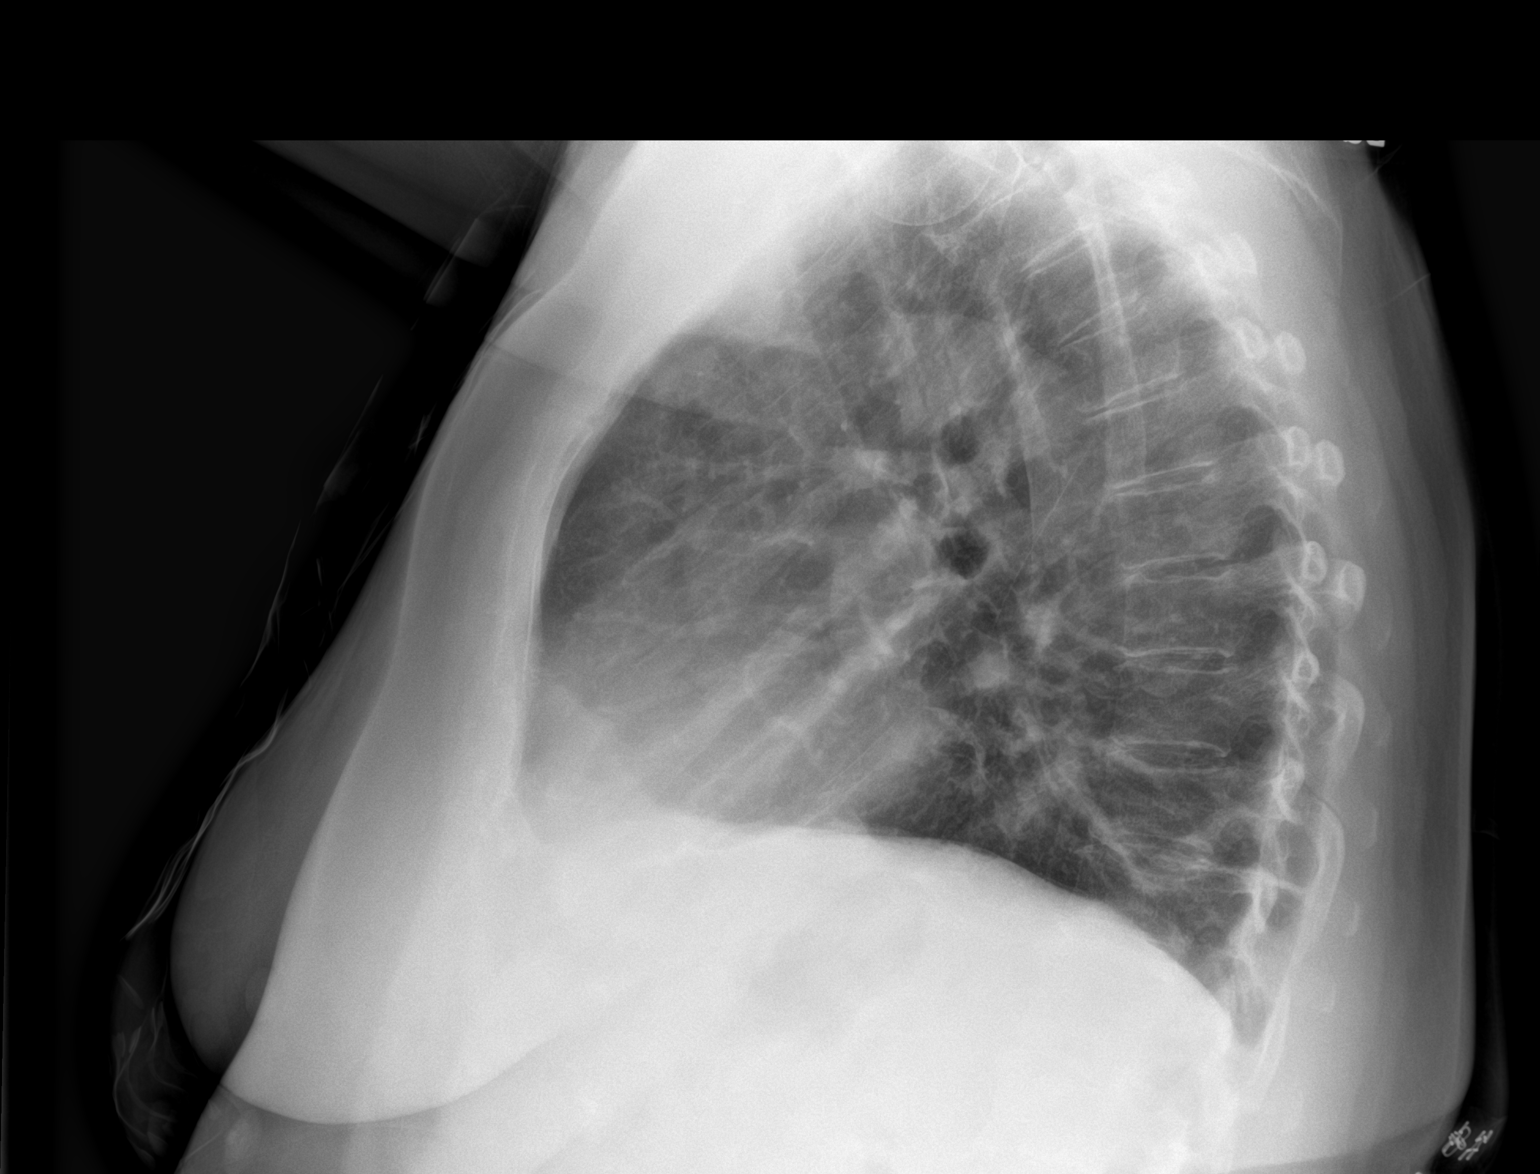

[2 of 2 positions shown; findings below may reference images not displayed]

FINDINGS: The heart size and mediastinal contours are within normal limits.
Right lung is clear. Minimal left basilar subsegmental atelectasis
is noted. The visualized skeletal structures are unremarkable.
IMPRESSION: Minimal left basilar subsegmental atelectasis.

## 2022-07-05 ENCOUNTER — Ambulatory Visit
Admission: EM | Admit: 2022-07-05 | Discharge: 2022-07-05 | Disposition: A | Discharge status: Home / Self Care | Payer: Commercial Managed Care - HMO | Attending: Nurse Practitioner | Admitting: Nurse Practitioner

## 2022-07-05 DIAGNOSIS — H66012 Acute suppurative otitis media with spontaneous rupture of ear drum, left ear: Secondary | ICD-10-CM | POA: Diagnosis not present

## 2022-07-05 MED ORDER — AMOXICILLIN-POT CLAVULANATE 875-125 MG PO TABS: 1.0000 | ORAL_TABLET | Freq: Two times a day (BID) | ORAL | 0 refills | Status: AC

## 2022-07-05 MED ORDER — OFLOXACIN 0.3 % OT SOLN: 10.0000 [drp] | Freq: Every day | OTIC | 0 refills | Status: AC

## 2022-07-05 NOTE — ED Provider Notes (Addendum)
[facility]    CSN: [id number] Arrival date & time: 07/05/22  1145      History   Chief Complaint No chief complaint on file.   HPI Alexandra Floyd is a 57 y.o. female.   Patient presents today for 4 days history of left sided ear pain and drainage that is red in color, body aches, runny and stuffy nose, nausea without vomiting, decreased appetite, and muffled hearing.  She denies fever, congested cough, chest pain or shortness of breath currently, sore throat, headache, abdominal pain, vomiting, and new rash.  Has tried Tylenol for symptoms without improvement.  Reports she is a current smoker.  Patient is also requesting refill of levothyroxine today.  Reports she has been out of this medication for the past few months.  She has missed PCP appointments recently.    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      Home Medications    Prior to Admission medications   Medication Sig Start Date End Date Taking? Authorizing Provider  amoxicillin-clavulanate (AUGMENTIN) 875-125 MG tablet Take 1 tablet by mouth 2 (two) times daily for 7 days. 07/05/22 07/12/22 Yes  Valentino Nose, NP  ofloxacin (FLOXIN) 0.3 % OTIC solution Place 10 drops into the left ear daily for 7 days. 07/05/22 07/12/22 Yes Valentino Nose, NP  albuterol (VENTOLIN HFA) 108 (90 Base) MCG/ACT inhaler Inhale 1-2 puffs into the lungs every 6 (six) hours as needed for wheezing or shortness of breath. Patient not taking: Reported on 04/27/2022 02/03/22   Jeannie Fend, PA-C  HYDROcodone-acetaminophen (NORCO/VICODIN) 5-325 MG tablet Take one tab po q 4 hrs prn pain Patient not taking: Reported on 11/25/2021 08/20/21   Pauline Aus, PA-C  levothyroxine (SYNTHROID) 25 MCG tablet Take 1 tablet (25 mcg total) by mouth daily at 6 (six) AM. Patient not taking: Reported on 04/27/2022 08/10/21   Jacquelin Hawking, PA-C  naproxen (NAPROSYN) 500 MG tablet Take 1 tablet (500 mg total) by mouth 2 (two) times daily. 04/27/22   Jacquelin Hawking, PA-C    Family History History reviewed. No pertinent family history.  Social History Social History   Tobacco Use   Smoking status: Every Day    Packs/day: .5    Types: Cigarettes   Smokeless tobacco: Never  Vaping Use   Vaping Use: Never used  Substance Use Topics   Alcohol use: Not Currently    Alcohol/week: 12.0 standard drinks of alcohol    Types: 12 Cans of beer per week   Drug use: Not Currently    Types: Methamphetamines, "Crack" cocaine, Heroin  Allergies   Lasix [furosemide]   Review of Systems Review of Systems Per HPI  Physical Exam Triage Vital Signs ED Triage Vitals  Enc Vitals Group     BP 07/05/22 1158 (!) 144/84     Pulse Rate 07/05/22 1158 78     Resp 07/05/22 1158 18     Temp 07/05/22 1158 98 F (36.7 C)     Temp Source 07/05/22 1158 Oral     SpO2 07/05/22 1158 97 %     Weight --      Height --      Head Circumference --      Peak Flow --      Pain Score 07/05/22 1201 10     Pain Loc --      Pain Edu? --      Excl. in GC? --    No data found.  Updated Vital Signs BP (!) 144/84 (BP Location: Right  Arm)   Pulse 78   Temp 98 F (36.7 C) (Oral)   Resp 18   SpO2 97%   Visual Acuity Right Eye Distance:   Left Eye Distance:   Bilateral Distance:    Right Eye Near:   Left Eye Near:    Bilateral Near:     Physical Exam Vitals and nursing note reviewed.  Constitutional:      General: She is not in acute distress.    Appearance: Normal appearance. She is not ill-appearing or toxic-appearing.  HENT:     Head: Normocephalic and atraumatic.     Right Ear: Tympanic membrane, ear canal and external ear normal.     Left Ear: Tympanic membrane, ear canal and external ear normal. Drainage and swelling present.     Ears:     Comments: Swelling and drainage noted to left external auditory canal; unable to visualize TM    Nose: No congestion or rhinorrhea.     Mouth/Throat:     Mouth: Mucous membranes are moist.     Pharynx: Oropharynx is clear. Posterior oropharyngeal erythema present. No oropharyngeal exudate.  Eyes:     General: No scleral icterus.    Extraocular Movements: Extraocular movements intact.  Cardiovascular:     Rate and Rhythm: Normal rate and regular rhythm.  Pulmonary:     Effort: Pulmonary effort is normal. No respiratory distress.     Breath sounds: Normal breath sounds. No wheezing, rhonchi or rales.  Musculoskeletal:     Cervical back: Normal range of motion and neck supple.  Lymphadenopathy:     Cervical: No cervical adenopathy.  Skin:    General: Skin is warm and dry.     Coloration: Skin is not jaundiced or pale.     Findings: No erythema or rash.  Neurological:     Mental Status: She is alert and oriented to person, place, and time.  Psychiatric:        Behavior: Behavior is cooperative.      UC Treatments / Results  Labs (all labs ordered are listed, but only abnormal results are displayed) Labs Reviewed - No data to display  EKG   Radiology No results found.  Procedures Procedures (including critical care time)  Medications Ordered in  UC Medications - No data to display  Initial Impression / Assessment and Plan / UC Course  I have reviewed the triage vital signs and the nursing notes.  Pertinent labs & imaging results that were available during my care of the patient were reviewed by me and  considered in my medical decision making (see chart for details).   Patient is well-appearing, normotensive, afebrile, not tachycardic, not tachypneic, oxygenating well on room air.    1. Non-recurrent acute suppurative otitis media of left ear with spontaneous rupture of tympanic membrane Unable to fully visualize TM on exam today Will treat for rupture with ofloxacin drops Start Augmentin as well since unable to determine status of TM Supportive care discussed ER and return precautions also discussed   I recommended follow up with PCP for further evaluation/management of history of hypothyroidism since she has been out of levothyroxine a couple of months.   The patient was given the opportunity to ask questions.  All questions answered to their satisfaction.  The patient is in agreement to this plan.    Final Clinical Impressions(s) / UC Diagnoses   Final diagnoses:  Non-recurrent acute suppurative otitis media of left ear with spontaneous rupture of tympanic membrane     Discharge Instructions      Take the Augmentin and ear drops as prescribed to treat the ear infection.  Try to avoid getting any water in your ear until its healed.      ED Prescriptions     Medication Sig Dispense Auth. Provider   amoxicillin-clavulanate (AUGMENTIN) 875-125 MG tablet Take 1 tablet by mouth 2 (two) times daily for 7 days. 14 tablet Cathlean Marseilles A, NP   ofloxacin (FLOXIN) 0.3 % OTIC solution Place 10 drops into the left ear daily for 7 days. 5 mL Valentino Nose, NP      PDMP not reviewed this encounter.   Valentino Nose, NP 07/05/22 1521    Valentino Nose, NP 07/05/22 [phone]

## 2022-07-05 NOTE — Discharge Instructions (Signed)
Take the Augmentin and ear drops as prescribed to treat the ear infection.  Try to avoid getting any water in your ear until its healed.

## 2022-07-05 NOTE — ED Triage Notes (Signed)
Pt reports she has left side ear pain, drainage, and facial swelling and cough x 4 days.     Needs thyroid meds filled.

## 2022-08-02 ENCOUNTER — Emergency Department (HOSPITAL_COMMUNITY): Payer: Commercial Managed Care - HMO

## 2022-08-02 ENCOUNTER — Other Ambulatory Visit: Payer: Self-pay

## 2022-08-02 ENCOUNTER — Emergency Department (HOSPITAL_COMMUNITY)
Admission: EM | Admit: 2022-08-02 | Discharge: 2022-08-02 | Disposition: A | Discharge status: Home / Self Care | Payer: Commercial Managed Care - HMO | Attending: Emergency Medicine | Admitting: Emergency Medicine

## 2022-08-02 DIAGNOSIS — W540XXA Bitten by dog, initial encounter: Secondary | ICD-10-CM | POA: Insufficient documentation

## 2022-08-02 DIAGNOSIS — S61052A Open bite of left thumb without damage to nail, initial encounter: Secondary | ICD-10-CM | POA: Diagnosis present

## 2022-08-02 MED ORDER — LORAZEPAM 1 MG PO TABS
2.0000 mg | ORAL_TABLET | Freq: Once | ORAL | Status: AC
  Administered 2022-08-02: 2 mg via ORAL
  Filled 2022-08-02: qty 2

## 2022-08-02 MED ORDER — AMOXICILLIN-POT CLAVULANATE 875-125 MG PO TABS: 1.0000 | ORAL_TABLET | Freq: Two times a day (BID) | ORAL | 0 refills | Status: DC

## 2022-08-02 MED ORDER — AMOXICILLIN-POT CLAVULANATE 875-125 MG PO TABS
1.0000 | ORAL_TABLET | Freq: Once | ORAL | Status: AC
  Administered 2022-08-02: 1 via ORAL
  Filled 2022-08-02: qty 1

## 2022-08-02 NOTE — ED Triage Notes (Signed)
Pt reports she was breaking up a fight between dogs and was bitten on the left thumb. Pt unsure about rabies vaccine on the stray.

## 2022-08-02 NOTE — ED Provider Notes (Signed)
[place] EMERGENCY DEPARTMENT AT [facility] Provider Note   CSN: [id number] Arrival date & time: 08/02/22  2130     History  No chief complaint on file.   Alexandra Floyd is a 57 y.o. female.  57 year old female who presents to the emergency department with laceration of left thumb from a dog bite.  Patient reports that she was breaking up a dog fight and was bitten by neighbors dog on her left thumb.  Says that the dog is a [place] and is up-to-date on all of its vaccines.  She is up-to-date on her tetanus shot.  Denies any additional dog bites.  Husband is here with similar symptoms.       Home Medications Prior to Admission medications   Medication Sig Start Date End Date Taking? Authorizing Provider  amoxicillin-clavulanate (AUGMENTIN) 875-125 MG tablet Take 1 tablet by mouth every 12 (twelve) hours. 08/02/22  Yes Rondel Baton, MD  albuterol (VENTOLIN HFA) 108 (90 Base) MCG/ACT inhaler Inhale 1-2 puffs into the lungs every 6 (six) hours as needed for wheezing or shortness of breath. Patient not taking: Reported on 04/27/2022 02/03/22   Jeannie Fend, PA-C  HYDROcodone-acetaminophen (NORCO/VICODIN) 5-325 MG tablet Take one tab po q 4 hrs prn pain Patient not taking: Reported on 11/25/2021 08/20/21   Pauline Aus, PA-C  levothyroxine (SYNTHROID) 25 MCG tablet Take 1 tablet (25 mcg total) by mouth daily at 6 (six) AM. Patient not taking: Reported on 04/27/2022 08/10/21   Jacquelin Hawking, PA-C  naproxen (NAPROSYN) 500 MG tablet Take 1 tablet (500 mg total) by mouth 2 (two) times daily. 04/27/22   Jacquelin Hawking, PA-C      Allergies    Lasix [furosemide]    Review of Systems   Review of Systems  Physical Exam Updated Vital Signs BP (!) 130/98 (BP Location: Right Arm)   Pulse (!) 113   Temp 97.7 F (36.5 C) (Oral)   Resp 20   Ht 5\' 3"  (1.6 m)   Wt 72.6 kg   SpO2 97%   BMI 28.34 kg/m  Physical Exam Constitutional:      Appearance: Normal  appearance.  HENT:     Head: Normocephalic and atraumatic.     Right Ear: External ear normal.     Left Ear: External ear normal.  Eyes:     Extraocular Movements: Extraocular movements intact.     Conjunctiva/sclera: Conjunctivae normal.     Pupils: Pupils are equal, round, and reactive to light.  Musculoskeletal:     Cervical back: Normal range of motion and neck supple.     Comments: Radial pulse 2+ bilaterally  Neurological:     Mental Status: She is alert.     Comments: Small amount of bruising to the pad of the left thumb with very small laceration     ED Results / Procedures / Treatments   Labs (all labs ordered are listed, but only abnormal results are displayed) Labs Reviewed - No data to display  EKG None  Radiology DG Finger Thumb Left  Result Date: 08/02/2022 CLINICAL DATA:  Dog bite EXAM: LEFT THUMB 3V COMPARISON:  02/03/2022 FINDINGS: Degenerative interphalangeal and thumb base spurring with joint space narrowing. Interphalangeal degeneration also seen at the index and middle fingers where partially covered. No acute fracture, dislocation, or opaque foreign body. IMPRESSION: No acute finding. Electronically Signed   By: Tiburcio Pea M.D.   On: 08/02/2022 07:59    Procedures Procedures    Medications  Ordered in ED Medications  amoxicillin-clavulanate (AUGMENTIN) 875-125 MG per tablet 1 tablet (1 tablet Oral Given 08/02/22 0739)  LORazepam (ATIVAN) tablet 2 mg (2 mg Oral Given 08/02/22 0740)    ED Course/ Medical Decision Making/ A&P                             Medical Decision Making Amount and/or Complexity of Data Reviewed Radiology: ordered.  Risk Prescription drug management.   Alexandra Floyd is a 57 y.o. female who presents to the emergency department with laceration of the left thumb from a dog bite  Initial Ddx:  Dog bite, crush injury, retained foreign body  MDM/Course:  Patient does have a small laceration to the thumb approximately the  same size as a paper cut.  Had an x-ray that did not show any fractures or retained foreign bodies.  Says that she is up-to-date on her tetanus shot.  Dog is up-to-date on its vaccinations so she does not need rabies shot at this time.  Patient washed out her hand in the emergency department to prevent any sort of infection.  Was requesting something for anxiety so was given some Ativan.  Upon re-evaluation was feeling better.  Was discharged home with Augmentin and instructions to follow-up with her primary doctor.  This patient presents to the ED for concern of complaints listed in HPI, this involves an extensive number of treatment options, and is a complaint that carries with it a high risk of complications and morbidity. Disposition including potential need for admission considered.   Dispo: DC Home. Return precautions discussed including, but not limited to, those listed in the AVS. Allowed pt time to ask questions which were answered fully prior to dc.  Additional history obtained from spouse Records reviewed Outpatient Clinic Notes I independently reviewed the following imaging with scope of interpretation limited to determining acute life threatening conditions related to emergency care: Extremity x-ray(s) and agree with the radiologist interpretation with the following exceptions: none I personally reviewed and interpreted cardiac monitoring: normal sinus rhythm  I personally reviewed and interpreted the pt's EKG: see above for interpretation  I have reviewed the patients home medications and made adjustments as needed        Final Clinical Impression(s) / ED Diagnoses Final diagnoses:  Open wound of left thumb due to dog bite    Rx / DC Orders ED Discharge Orders          Ordered    amoxicillin-clavulanate (AUGMENTIN) 875-125 MG tablet  Every 12 hours        08/02/22 0806              Rondel Baton, MD 08/02/22 [phone]

## 2022-08-02 NOTE — Discharge Instructions (Addendum)
You were seen for dog bite in the emergency department.   At home, please take the antibiotics to prevent infection.    Check your MyChart online for the results of any tests that had not resulted by the time you left the emergency department.   Follow-up with your primary doctor in 2-3 days regarding your visit.  If you do not have a primary care follow-up with 3 0 for primary care.  Return immediately to the emergency department if you experience any of the following: Redness or streaking of your hands, fevers, or any other concerning symptoms.    Thank you for visiting our Emergency Department. It was a pleasure taking care of you today.

## 2022-08-17 ENCOUNTER — Encounter: Payer: Self-pay | Admitting: Internal Medicine

## 2022-08-17 ENCOUNTER — Ambulatory Visit (INDEPENDENT_AMBULATORY_CARE_PROVIDER_SITE_OTHER): Payer: Commercial Managed Care - HMO | Admitting: Internal Medicine

## 2022-08-17 VITALS — BP 128/86 | HR 72 | Resp 16 | Ht 63.0 in | Wt 165.1 lb

## 2022-08-17 DIAGNOSIS — K219 Gastro-esophageal reflux disease without esophagitis: Secondary | ICD-10-CM | POA: Insufficient documentation

## 2022-08-17 DIAGNOSIS — Z1211 Encounter for screening for malignant neoplasm of colon: Secondary | ICD-10-CM | POA: Diagnosis not present

## 2022-08-17 DIAGNOSIS — E7800 Pure hypercholesterolemia, unspecified: Secondary | ICD-10-CM | POA: Diagnosis not present

## 2022-08-17 DIAGNOSIS — E039 Hypothyroidism, unspecified: Secondary | ICD-10-CM | POA: Insufficient documentation

## 2022-08-17 DIAGNOSIS — Z1159 Encounter for screening for other viral diseases: Secondary | ICD-10-CM

## 2022-08-17 DIAGNOSIS — Z9189 Other specified personal risk factors, not elsewhere classified: Secondary | ICD-10-CM

## 2022-08-17 MED ORDER — LEVOTHYROXINE SODIUM 25 MCG PO TABS
25.0000 ug | ORAL_TABLET | Freq: Every day | ORAL | 0 refills | Status: DC
Start: 2022-08-17 — End: 2023-02-24

## 2022-08-17 MED ORDER — OMEPRAZOLE 20 MG PO CPDR: 20.0000 mg | DELAYED_RELEASE_CAPSULE | Freq: Every day | ORAL | 0 refills | Status: DC

## 2022-08-17 NOTE — Assessment & Plan Note (Signed)
No family history of colon cancer. Age 57 with no prior screening. Discussed screening options Referral placed to GI for colonoscopy

## 2022-08-17 NOTE — Progress Notes (Signed)
    HPI:Alexandra Floyd is a 57 y.o. female with acquired hypothyroidism and GERD who presents to establish care. She has been off of levothyroxine for months and noticed weight gain. Previously treated for MDD and anxiety but no longer have these symptoms. Also previous had polysubstance abuse and no longer using substances. She has found changing her environment and friends has helped. She was told in 1990's she had hepatitis C but never received treatment.  She was on omeprazole for GERD and having increased heart burn since being out of medication.   Past Medical History:  Diagnosis Date   Anxiety    Cellulitis    Depression    Heart murmur    Hep C w/ coma, chronic    MRSA cellulitis     Past Surgical History:  Procedure Laterality Date   arm sx     CHOLECYSTECTOMY      Family History  Problem Relation Age of Onset   Hypertension Mother     Social History   Tobacco Use   Smoking status: Every Day    Current packs/day: 1.00    Average packs/day: 1 pack/day for 46.5 years (46.5 ttl pk-yrs)    Types: Cigarettes    Start date: 1978   Smokeless tobacco: Never  Vaping Use   Vaping status: Never Used  Substance Use Topics   Alcohol use: Not Currently    Comment: occasionally   Drug use: Not Currently    Types: Methamphetamines, "Crack" cocaine, Heroin    Comment: occasionally    Physical Exam: Vitals:   08/17/22 1047  BP: 128/86  Pulse: 72  Resp: 16  SpO2: 96%  Weight: 165 lb 1.9 oz (74.9 kg)  Height: 5\' 3"  (1.6 m)     Physical Exam Constitutional:      General: She is not in acute distress.    Appearance: She is not ill-appearing.  Cardiovascular:     Rate and Rhythm: Normal rate and regular rhythm.     Heart sounds: No murmur heard. Pulmonary:     Effort: Pulmonary effort is normal. No respiratory distress.     Breath sounds: No wheezing.  Abdominal:     Palpations: Abdomen is soft. There is no hepatomegaly.  Musculoskeletal:        General:  Deformity (right thumb deformity, previous injury requiring surgery) present.     Right lower leg: No edema.     Left lower leg: No edema.      Assessment & Plan:   Screening for colon cancer No family history of colon cancer. Age 36 with no prior screening. Discussed screening options Referral placed to GI for colonoscopy   Encounter for hepatitis C virus screening test for high risk patient Remove history of IV drug use. Told she had Hep C in 90's Check Hep C antibody with reflex to quant  Pure hypercholesterolemia Review of records 05/2021 show elevated LDL. No currently on statin. Current smoker. Check lipid panel   Hypothyroidism (acquired) Chronic problem, uncontrolled  Patient has been off of levothyroxine Restart levothyroxine 25 mcg daily Check TSH    GERD (gastroesophageal reflux disease) Chronic problem, uncontrolled Previously controlled on Omeprazole Start Omeprazole 20 mg      Milus Banister, MD

## 2022-08-17 NOTE — Assessment & Plan Note (Signed)
Review of records 05/2021 show elevated LDL. No currently on statin. Current smoker. Check lipid panel

## 2022-08-17 NOTE — Assessment & Plan Note (Signed)
Remove history of IV drug use. Told she had Hep C in 90's Check Hep C antibody with reflex to [employer]

## 2022-08-17 NOTE — Patient Instructions (Signed)
Thank you, Alexandra Floyd for allowing [place] to provide your care today.   I have ordered the following labs for you:   Lab Orders         HCV Ab w Reflex to Quant PCR         Lipid panel         TSH       Referrals ordered today:    Referral Orders         Ambulatory referral to Gastroenterology       Reminders: Go by lab. Follow up in 3 months     Thurmon Fair, M.D.

## 2022-08-17 NOTE — Assessment & Plan Note (Signed)
Chronic problem, uncontrolled  Patient has been off of levothyroxine Restart levothyroxine 25 mcg daily Check TSH

## 2022-08-17 NOTE — Assessment & Plan Note (Addendum)
Chronic problem, uncontrolled Previously controlled on Omeprazole Start Omeprazole 20 mg

## 2022-08-18 ENCOUNTER — Encounter: Payer: Self-pay | Admitting: *Deleted

## 2022-08-19 LAB — HCV RT-PCR, QUANT (NON-GRAPH)

## 2022-08-19 LAB — LIPID PANEL
Chol/HDL Ratio: 4.4 ratio (ref 0.0–4.4)
Cholesterol, Total: 192 mg/dL (ref 100–199)
HDL: 44 mg/dL (ref >39)
LDL Chol Calc (NIH): 104 mg/dL — ABNORMAL HIGH (ref 0–99)
Triglycerides: 255 mg/dL — ABNORMAL HIGH (ref 0–149)
VLDL Cholesterol Cal: 44 mg/dL — ABNORMAL HIGH (ref 5–40)

## 2022-08-19 LAB — HCV AB W REFLEX TO QUANT PCR: HCV Ab: REACTIVE — AB

## 2022-08-19 LAB — HCV RNA (INTERNATIONAL UNITS)
HCV RNA (International Units): 41700000 IU/mL
HCV log10: 7.62 log10 IU/mL

## 2022-08-19 LAB — TSH: TSH: 2.61 u[IU]/mL (ref 0.450–4.500)

## 2022-08-20 ENCOUNTER — Other Ambulatory Visit: Payer: Self-pay | Admitting: Internal Medicine

## 2022-08-20 DIAGNOSIS — B182 Chronic viral hepatitis C: Secondary | ICD-10-CM

## 2022-08-20 NOTE — Progress Notes (Signed)
Hep C antibody positive with presence of Hep C RNA.  - Referral to ID for further evaluation and treatment

## 2022-08-26 ENCOUNTER — Ambulatory Visit
Admission: EM | Admit: 2022-08-26 | Discharge: 2022-08-26 | Disposition: A | Discharge status: Home / Self Care | Payer: Commercial Managed Care - HMO | Attending: Family Medicine | Admitting: Family Medicine

## 2022-08-26 ENCOUNTER — Encounter: Payer: Self-pay | Admitting: Emergency Medicine

## 2022-08-26 DIAGNOSIS — R300 Dysuria: Secondary | ICD-10-CM | POA: Diagnosis not present

## 2022-08-26 DIAGNOSIS — R350 Frequency of micturition: Secondary | ICD-10-CM | POA: Diagnosis not present

## 2022-08-26 LAB — POCT URINALYSIS DIP (MANUAL ENTRY)
Bilirubin, UA: NEGATIVE
Glucose, UA: NEGATIVE mg/dL
Ketones, POC UA: NEGATIVE mg/dL
Leukocytes, UA: NEGATIVE
Nitrite, UA: NEGATIVE
Protein Ur, POC: NEGATIVE mg/dL
Spec Grav, UA: 1.01 (ref 1.010–1.025)
Urobilinogen, UA: 0.2 E.U./dL
pH, UA: 6 (ref 5.0–8.0)

## 2022-08-26 NOTE — Discharge Instructions (Signed)
Your urine test today does not show evidence of a urinary tract infection.  Make sure to stay well-hydrated, take probiotics and Azo as needed.  Follow-up if your symptoms worsen or do not resolve

## 2022-08-26 NOTE — ED Provider Notes (Signed)
[facility]    CSN: [id number] Arrival date & time: 08/26/22  1427      History   Chief Complaint No chief complaint on file.   HPI Alexandra Floyd is a 57 y.o. female.   Patient presenting today with about a week of waxing and waning dysuria, urinary frequency and urgency.  Denies fever, chills, nausea vomiting, flank pain, vaginal symptoms or pelvic pain.  So far not tried anything over-the-counter for symptoms but notes symptoms improve when she drinks more water.  Denies concern for STIs.  Patient is postmenopausal.    Past Medical History:  Diagnosis Date   Anxiety    Cellulitis    Depression    Heart murmur    Hep C w/ coma, chronic    MRSA cellulitis     Patient Active Problem List   Diagnosis Date Noted   Encounter for hepatitis C virus screening test for high risk patient 08/17/2022   Pure hypercholesterolemia 08/17/2022   Hypothyroidism (acquired) 08/17/2022   Screening for colon cancer 08/17/2022   GERD (gastroesophageal reflux disease) 08/17/2022   Substance abuse (HCC) 06/10/2021   TSH elevation 06/10/2021   Substance induced mood disorder (HCC) 09/21/2019   Polysubstance abuse (HCC) 09/21/2019   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      Home Medications    Prior to Admission medications   Medication Sig Start Date End Date Taking? Authorizing Provider  levothyroxine (SYNTHROID) 25 MCG tablet Take 1 tablet (25 mcg total) by mouth daily before breakfast. 08/17/22   Gardenia Phlegm, MD  omeprazole (PRILOSEC) 20 MG capsule Take 1 capsule (20 mg total) by mouth daily. 08/17/22   Gardenia Phlegm, MD    Family History Family History  Problem Relation Age of Onset   Hypertension Mother     Social History Social History   Tobacco Use   Smoking status: Every Day    Current packs/day: 1.00    Average packs/day: 1 pack/day for 46.6 years (46.6 ttl pk-yrs)    Types: Cigarettes    Start date: 1978   Smokeless tobacco: Never  Vaping Use   Vaping status: Never Used  Substance Use Topics   Alcohol use: Not Currently    Comment: occasionally   Drug use: Not Currently    Types: Methamphetamines, "Crack" cocaine, Heroin    Comment: occasionally     Allergies   Lasix [furosemide]   Review of Systems Review of Systems Per HPI  Physical Exam Triage Vital Signs ED Triage Vitals  Encounter Vitals Group     BP 08/26/22 1510 110/77     Systolic BP Percentile --      Diastolic BP Percentile --      Pulse Rate 08/26/22 1510 76     Resp 08/26/22 1510 18     Temp 08/26/22 1510 97.8 F (36.6 C)     Temp Source 08/26/22 1510 Oral     SpO2 08/26/22 1510 98 %     Weight --  Height --      Head Circumference --      Peak Flow --      Pain Score 08/26/22 1511 6     Pain Loc --      Pain Education --      Exclude from Growth Chart --    No data found.  Updated Vital Signs BP 110/77 (BP Location: Right Arm)   Pulse 76   Temp 97.8 F (36.6 C) (Oral)   Resp 18   SpO2 98%   Visual Acuity Right Eye Distance:   Left Eye Distance:   Bilateral Distance:    Right Eye Near:   Left Eye Near:    Bilateral Near:     Physical Exam Vitals and nursing note reviewed.  Constitutional:      Appearance: Normal appearance. She is not ill-appearing.  HENT:     Head: Atraumatic.  Eyes:     Extraocular Movements: Extraocular movements intact.     Conjunctiva/sclera: Conjunctivae normal.  Cardiovascular:     Rate and Rhythm: Normal rate and regular  rhythm.     Heart sounds: Normal heart sounds.  Pulmonary:     Effort: Pulmonary effort is normal.     Breath sounds: Normal breath sounds.  Abdominal:     General: Bowel sounds are normal. There is no distension.     Palpations: Abdomen is soft.     Tenderness: There is no abdominal tenderness. There is no right CVA tenderness, left CVA tenderness or guarding.  Musculoskeletal:        General: Normal range of motion.     Cervical back: Normal range of motion and neck supple.  Skin:    General: Skin is warm and dry.  Neurological:     Mental Status: She is alert and oriented to person, place, and time.  Psychiatric:        Mood and Affect: Mood normal.        Thought Content: Thought content normal.        Judgment: Judgment normal.      UC Treatments / Results  Labs (all labs ordered are listed, but only abnormal results are displayed) Labs Reviewed  POCT URINALYSIS DIP (MANUAL ENTRY) - Abnormal; Notable for the following components:      Result Value   Color, UA light yellow (*)    Blood, UA trace-lysed (*)    All other components within normal limits    EKG   Radiology No results found.  Procedures Procedures (including critical care time)  Medications Ordered in UC Medications - No data to display  Initial Impression / Assessment and Plan / UC Course  I have reviewed the triage vital signs and the nursing notes.  Pertinent labs & imaging results that were available during my care of the patient were reviewed by me and considered in my medical decision making (see chart for details).     Vitals and exam reassuring today, urinalysis without evidence of urinary tract infection.  Discussed push fluids, probiotics, Azo as needed.  Return for worsening symptoms.  Final Clinical Impressions(s) / UC Diagnoses   Final diagnoses:  Urinary frequency  Dysuria     Discharge Instructions      Your urine test today does not show evidence of a urinary tract  infection.  Make sure to stay well-hydrated, take probiotics and Azo as needed.  Follow-up if your symptoms worsen or do not resolve    ED Prescriptions   None  PDMP not reviewed this encounter.   Particia Nearing, [place] 08/26/22 1552

## 2022-08-26 NOTE — ED Triage Notes (Signed)
Urinary pressure and burning x 1 week.

## 2022-09-01 ENCOUNTER — Other Ambulatory Visit (HOSPITAL_COMMUNITY): Payer: Self-pay

## 2022-09-01 ENCOUNTER — Telehealth: Payer: Self-pay

## 2022-09-01 NOTE — Telephone Encounter (Signed)
[facility] Patient Advocate Encounter  Insurance verification completed.    The patient is insured through [employer].  Medication will need a PA.  We will continue to follow to see if copay assistance is needed.  Donna Dean, CPhT [facility] Phone: [phone] Fax:  [phone]  

## 2022-09-06 ENCOUNTER — Encounter: Payer: Commercial Managed Care - HMO | Admitting: Family

## 2022-09-09 ENCOUNTER — Encounter: Payer: Self-pay | Admitting: Emergency Medicine

## 2022-09-09 ENCOUNTER — Ambulatory Visit
Admission: EM | Admit: 2022-09-09 | Discharge: 2022-09-09 | Disposition: A | Discharge status: Home / Self Care | Payer: Commercial Managed Care - HMO | Attending: Family Medicine | Admitting: Family Medicine

## 2022-09-09 DIAGNOSIS — M79622 Pain in left upper arm: Secondary | ICD-10-CM

## 2022-09-09 DIAGNOSIS — Z8614 Personal history of Methicillin resistant Staphylococcus aureus infection: Secondary | ICD-10-CM | POA: Diagnosis not present

## 2022-09-09 DIAGNOSIS — J3489 Other specified disorders of nose and nasal sinuses: Secondary | ICD-10-CM

## 2022-09-09 MED ORDER — SULFAMETHOXAZOLE-TRIMETHOPRIM 800-160 MG PO TABS: 1.0000 | ORAL_TABLET | Freq: Two times a day (BID) | ORAL | 0 refills | Status: AC

## 2022-09-09 NOTE — ED Triage Notes (Signed)
Swollen lymph node under left armpit x 5 days.  States she has had diarrhea today with nausea.

## 2022-09-09 NOTE — ED Provider Notes (Signed)
[facility]    CSN: [id number] Arrival date & time: 09/09/22  1110      History   Chief Complaint No chief complaint on file.   HPI Alexandra Floyd is a 57 y.o. female.   Presenting today with sores in her nose that have been painful as well as which she thinks may be a swollen lymph node under her left underarm.  She also complains of intermittent fevers the past few days.  Denies night sweats, body aches, unintended weight loss, breast pain or masses, skin lesions apart from the nasal area.  States she has a history of MRSA and is very concerned for this.  Not tried anything for symptoms.    Past Medical History:  Diagnosis Date   Anxiety    Cellulitis    Depression    Heart murmur    Hep C w/ coma, chronic    MRSA cellulitis     Patient Active Problem List   Diagnosis Date Noted   Encounter for hepatitis C virus screening test for high risk patient 08/17/2022   Pure hypercholesterolemia 08/17/2022   Hypothyroidism (acquired) 08/17/2022   Screening for colon cancer 08/17/2022   GERD (gastroesophageal reflux disease) 08/17/2022   Substance abuse (HCC) 06/10/2021   TSH elevation 06/10/2021   Substance induced mood disorder (HCC) 09/21/2019   Polysubstance abuse (HCC) 09/21/2019   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      Home Medications    Prior to Admission medications   Medication Sig Start Date End Date Taking? Authorizing Provider  sulfamethoxazole-trimethoprim (BACTRIM DS)  800-160 MG tablet Take 1 tablet by mouth 2 (two) times daily for 7 days. 09/09/22 09/16/22 Yes Particia Nearing, PA-C  levothyroxine (SYNTHROID) 25 MCG tablet Take 1 tablet (25 mcg total) by mouth daily before breakfast. 08/17/22   Gardenia Phlegm, MD  omeprazole (PRILOSEC) 20 MG capsule Take 1 capsule (20 mg total) by mouth daily. 08/17/22   Gardenia Phlegm, MD    Family History Family History  Problem Relation Age of Onset   Hypertension Mother     Social History Social History   Tobacco Use   Smoking status: Every Day    Current packs/day: 1.00    Average packs/day: 1 pack/day for 46.6 years (46.6 ttl pk-yrs)    Types: Cigarettes    Start date: 1978   Smokeless tobacco: Never  Vaping Use   Vaping status: Never Used  Substance Use Topics   Alcohol use: Not Currently    Comment: occasionally   Drug use: Not Currently    Types: Methamphetamines, "Crack" cocaine, Heroin    Comment: occasionally     Allergies   Lasix [furosemide]   Review of Systems Review of Systems Per HPI  Physical Exam Triage Vital Signs ED Triage Vitals  Encounter Vitals Group     BP 09/09/22 1229 124/74     Systolic BP Percentile --      Diastolic BP Percentile --      Pulse Rate 09/09/22 1229  67     Resp 09/09/22 1229 18     Temp 09/09/22 1229 97.8 F (36.6 C)     Temp Source 09/09/22 1229 Oral     SpO2 09/09/22 1229 99 %     Weight --      Height --      Head Circumference --      Peak Flow --      Pain Score 09/09/22 1231 2     Pain Loc --      Pain Education --      Exclude from Growth Chart --    No data found.  Updated Vital Signs BP 124/74 (BP Location: Left Arm)   Pulse 67   Temp 97.8 F (36.6 C) (Oral)   Resp 18   SpO2 99%   Visual Acuity Right Eye Distance:   Left Eye Distance:   Bilateral Distance:    Right Eye Near:   Left Eye Near:    Bilateral Near:     Physical Exam Vitals and nursing note reviewed.  Constitutional:      Appearance: Normal  appearance. She is not ill-appearing.  HENT:     Head: Atraumatic.     Nose:     Comments: Nasal mucosa erythematous, no current sores, drainage, crusting    Mouth/Throat:     Mouth: Mucous membranes are moist.  Eyes:     Extraocular Movements: Extraocular movements intact.     Conjunctiva/sclera: Conjunctivae normal.  Cardiovascular:     Rate and Rhythm: Normal rate and regular rhythm.     Heart sounds: Normal heart sounds.  Pulmonary:     Effort: Pulmonary effort is normal.     Breath sounds: Normal breath sounds.  Musculoskeletal:        General: Normal range of motion.     Cervical back: Normal range of motion and neck supple.  Lymphadenopathy:     Comments: No appreciable palpable masses to the left underarm  Skin:    General: Skin is warm and dry.  Neurological:     Mental Status: She is alert and oriented to person, place, and time.  Psychiatric:        Mood and Affect: Mood normal.        Thought Content: Thought content normal.        Judgment: Judgment normal.      UC Treatments / Results  Labs (all labs ordered are listed, but only abnormal results are displayed) Labs Reviewed - No data to display  EKG   Radiology No results found.  Procedures Procedures (including critical care time)  Medications Ordered in UC Medications - No data to display  Initial Impression / Assessment and Plan / UC Course  I have reviewed the triage vital signs and the nursing notes.  Pertinent labs & imaging results that were available during my care of the patient were reviewed by me and considered in my medical decision making (see chart for details).     Exam overall benign appearing today with no obvious abnormalities, however given recent nasal sores, history of MRSA and her axillary pain will treat with a course of Bactrim and discussed Neosporin topically to the nose.  Follow-up with PCP for recheck, particularly if worsening Final Clinical Impressions(s) / UC  Diagnoses   Final diagnoses:  Nasal sore  History of MRSA infection  Left axillary pain   Discharge Instructions   None    ED Prescriptions     Medication Sig Dispense  Auth. Provider   sulfamethoxazole-trimethoprim (BACTRIM DS) 800-160 MG tablet Take 1 tablet by mouth 2 (two) times daily for 7 days. 14 tablet Particia Nearing, [place]      PDMP not reviewed this encounter.   Particia Nearing, [place] 09/09/22 1401

## 2022-10-01 ENCOUNTER — Other Ambulatory Visit: Payer: Self-pay

## 2022-10-01 ENCOUNTER — Encounter (HOSPITAL_COMMUNITY): Payer: Self-pay

## 2022-10-01 ENCOUNTER — Emergency Department (HOSPITAL_COMMUNITY)
Admission: EM | Admit: 2022-10-01 | Discharge: 2022-10-01 | Discharge status: Against Medical Advice | Payer: Commercial Managed Care - HMO | Source: Home / Self Care | Attending: Emergency Medicine | Admitting: Emergency Medicine

## 2022-10-01 DIAGNOSIS — Z5321 Procedure and treatment not carried out due to patient leaving prior to being seen by health care provider: Secondary | ICD-10-CM | POA: Diagnosis not present

## 2022-10-01 DIAGNOSIS — M79621 Pain in right upper arm: Secondary | ICD-10-CM | POA: Insufficient documentation

## 2022-10-01 NOTE — ED Notes (Signed)
Called pt back to ED room Pt outside smoking Pt stated she is waiting on her ride and does not want to be seen

## 2022-10-01 NOTE — ED Triage Notes (Signed)
Burn RIGHT hand 1 week ago. Pain up RIGHT arm Pt has hx of cellulitis and staph and wants to make sure she doesn't have an infection.

## 2022-10-06 ENCOUNTER — Other Ambulatory Visit (HOSPITAL_COMMUNITY): Payer: Self-pay

## 2022-10-11 ENCOUNTER — Encounter: Payer: Commercial Managed Care - HMO | Admitting: Family

## 2022-10-15 ENCOUNTER — Emergency Department (HOSPITAL_COMMUNITY)
Admission: EM | Admit: 2022-10-15 | Discharge: 2022-10-15 | Disposition: A | Discharge status: Home / Self Care | Payer: Commercial Managed Care - HMO | Attending: Emergency Medicine | Admitting: Emergency Medicine

## 2022-10-15 ENCOUNTER — Emergency Department (HOSPITAL_COMMUNITY): Payer: Commercial Managed Care - HMO

## 2022-10-15 ENCOUNTER — Other Ambulatory Visit: Payer: Self-pay

## 2022-10-15 ENCOUNTER — Encounter (HOSPITAL_COMMUNITY): Payer: Self-pay

## 2022-10-15 DIAGNOSIS — R072 Precordial pain: Secondary | ICD-10-CM | POA: Diagnosis present

## 2022-10-15 DIAGNOSIS — Z720 Tobacco use: Secondary | ICD-10-CM | POA: Diagnosis not present

## 2022-10-15 DIAGNOSIS — R079 Chest pain, unspecified: Secondary | ICD-10-CM

## 2022-10-15 DIAGNOSIS — R6884 Jaw pain: Secondary | ICD-10-CM | POA: Insufficient documentation

## 2022-10-15 LAB — BASIC METABOLIC PANEL
Anion gap: 8 (ref 5–15)
BUN: 9 mg/dL (ref 6–20)
CO2: 22 mmol/L (ref 22–32)
Calcium: 8.9 mg/dL (ref 8.9–10.3)
Chloride: 106 mmol/L (ref 98–111)
Creatinine, Ser: 0.72 mg/dL (ref 0.44–1.00)
GFR, Estimated: >60 mL/min (ref >60)
Glucose, Bld: 110 mg/dL — ABNORMAL HIGH (ref 70–99)
Potassium: 4 mmol/L (ref 3.5–5.1)
Sodium: 136 mmol/L (ref 135–145)

## 2022-10-15 LAB — CBC
HCT: 43.1 % (ref 36.0–46.0)
Hemoglobin: 14.1 g/dL (ref 12.0–15.0)
MCH: 31.7 pg (ref 26.0–34.0)
MCHC: 32.7 g/dL (ref 30.0–36.0)
MCV: 96.9 fL (ref 80.0–100.0)
Platelets: 254 10*3/uL (ref 150–400)
RBC: 4.45 MIL/uL (ref 3.87–5.11)
RDW: 13.2 % (ref 11.5–15.5)
WBC: 5 10*3/uL (ref 4.0–10.5)
nRBC: 0 % (ref 0.0–0.2)

## 2022-10-15 LAB — TROPONIN I (HIGH SENSITIVITY)
Troponin I (High Sensitivity): 3 ng/L (ref <18)
Troponin I (High Sensitivity): 2 ng/L (ref <18)

## 2022-10-15 MED ORDER — ASPIRIN 81 MG PO CHEW
324.0000 mg | CHEWABLE_TABLET | Freq: Once | ORAL | Status: AC
  Administered 2022-10-15: 324 mg via ORAL
  Filled 2022-10-15: qty 4

## 2022-10-15 MED ORDER — CLINDAMYCIN HCL 150 MG PO CAPS: 150.0000 mg | ORAL_CAPSULE | Freq: Four times a day (QID) | ORAL | 0 refills | Status: DC

## 2022-10-15 NOTE — ED Provider Notes (Signed)
[place] [facility] AT [facility] Provider Note   CSN: [id number] Arrival date & time: 10/15/22  1100     History  Chief Complaint  Patient presents with   Chest Pain   Jaw Pain    Alexandra Floyd is a 57 y.o. female.  She said she started with an infection on her hand about a month ago and took some leftover amoxicillin.  That got better but for the last week her jaw has been hurting especially on the left side.  For the last few days her blood pressure has been elevated and she started with some chest heaviness today.  Rates it a 7 out of 10.  Radiates to back.  No prior history of cardiac disease.  She thinks she has had a low-grade fever.  No cough or increase from baseline shortness of breath.  No nausea vomiting diarrhea.  The history is provided by the patient.  Chest Pain Pain location:  Substernal area Pain quality: pressure   Pain radiates to:  Mid back Pain severity:  Moderate Onset quality:  Gradual Duration:  1 day Timing:  Constant Progression:  Unchanged Chronicity:  New Context: not trauma   Relieved by:  None tried Worsened by:  Nothing Ineffective treatments:  None tried Associated symptoms: fever   Associated symptoms: no abdominal pain, no cough, no diaphoresis, no nausea, no shortness of breath and no vomiting   Risk factors: smoking        Home Medications Prior to Admission medications   Medication Sig Start Date End Date Taking? Authorizing Provider  levothyroxine (SYNTHROID) 25 MCG tablet Take 1 tablet (25 mcg total) by mouth daily before breakfast. 08/17/22   Gardenia Phlegm, MD  omeprazole (PRILOSEC) 20 MG capsule Take 1 capsule (20 mg total) by mouth daily. 08/17/22   Gardenia Phlegm, MD      Allergies    Lasix [furosemide]    Review of Systems   Review of Systems  Constitutional:  Positive for fever. Negative for diaphoresis.  HENT:  Positive for dental problem.   Respiratory:  Negative for cough and shortness of  breath.   Cardiovascular:  Positive for chest pain.  Gastrointestinal:  Negative for abdominal pain, nausea and vomiting.    Physical Exam Updated Vital Signs BP (!) 144/112 (BP Location: Right Arm)   Pulse 79   Temp 97.9 F (36.6 C) (Oral)   Resp (!) 22   Ht 5\' 3"  (1.6 m)   Wt 74.8 kg   SpO2 100%   BMI 29.23 kg/m  Physical Exam Vitals and nursing note reviewed.  Constitutional:      General: She is not in acute distress.    Appearance: She is well-developed.  HENT:     Head: Normocephalic and atraumatic.  Eyes:     Conjunctiva/sclera: Conjunctivae normal.  Cardiovascular:     Rate and Rhythm: Normal rate and regular rhythm.     Heart sounds: Normal heart sounds. No murmur heard. Pulmonary:     Effort: Pulmonary effort is normal. No respiratory distress.     Breath sounds: Normal breath sounds.  Abdominal:     Palpations: Abdomen is soft.     Tenderness: There is no abdominal tenderness.  Musculoskeletal:        General: No swelling.     Cervical back: Neck supple.  Skin:    General: Skin is warm and dry.     Capillary Refill: Capillary refill takes less than 2 seconds.  Neurological:     General: No focal deficit present.     Mental Status: She is alert.     ED Results / Procedures / Treatments   Labs (all labs ordered are listed, but only abnormal results are displayed) Labs Reviewed  BASIC METABOLIC PANEL - Abnormal; Notable for the following components:      Result Value   Glucose, Bld 110 (*)    All other components within normal limits  CBC  TROPONIN I (HIGH SENSITIVITY)  TROPONIN I (HIGH SENSITIVITY)    EKG EKG Interpretation Date/Time:  Saturday October 15 2022 11:07:27 EDT Ventricular Rate:  76 PR Interval:  162 QRS Duration:  72 QT Interval:  376 QTC Calculation: 423 R Axis:   23  Text Interpretation: Normal sinus rhythm Septal infarct , age undetermined Abnormal ECG When compared with ECG of 08-Mar-2022 08:49, rate slower than prior  2/24 Confirmed by Meridee Score [phone]) on 10/15/2022 11:08:42 AM  Radiology DG Chest 2 View  Result Date: 10/15/2022 CLINICAL DATA:  Chest pain and shortness of breath. EXAM: CHEST - 2 VIEW COMPARISON:  03/08/2022 FINDINGS: The heart size and mediastinal contours are within normal limits. Both lungs are clear. The visualized skeletal structures are unremarkable. IMPRESSION: No active cardiopulmonary disease. Electronically Signed   By: Signa Kell M.D.   On: 10/15/2022 11:51    Procedures Procedures    Medications Ordered in ED Medications  aspirin chewable tablet 324 mg (has no administration in time range)    ED Course/ Medical Decision Making/ A&P Clinical Course as of 10/15/22 1736  Sat Oct 15, 2022  1202 X-ray interpreted by me as no acute disease.  Awaiting radiology reading. [MB]  1431 Patient's cardiac workup is negative.  Her white count is normal and her vitals have been fairly unremarkable.  She is concerned about systemic infection which I do not see any evidence of.  She is asking to be started on some antibiotics for her MRSA.  She said clindamycin has worked for her in the past. [MB]    Clinical Course User Index [MB] Terrilee Files, MD                                 Medical Decision Making Amount and/or Complexity of Data Reviewed Labs: ordered. Radiology: ordered.  Risk OTC drugs. Prescription drug management.   This patient complains of jaw pain chest pain; this involves an extensive number of treatment Options and is a complaint that carries with it a high risk of complications and morbidity. The differential includes ACS, GERD, musculoskeletal, infection, pneumonia, vascular  I ordered, reviewed and interpreted labs, which included CBC normal chemistries normal other than mildly elevated glucose troponin unremarkable I ordered medication oral aspirin and reviewed PMP when indicated. I ordered imaging studies which included chest x-ray and I  independently    visualized and interpreted imaging which showed no acute findings Previous records obtained and reviewed patient in the ED fairly frequently reviewed prior ED notes Cardiac monitoring reviewed, sinus rhythm Social determinants considered, ongoing tobacco use Critical Interventions: None  After the interventions stated above, I reevaluated the patient and found patient to be feeling better Admission and further testing considered, no indications for admission or further workup at this time.  Will put patient on antibiotics at her request for possible MRSA infection.  Recommended follow-up with PCP.  Return instructions discussed  Final Clinical Impression(s) / ED Diagnoses Final diagnoses:  Nonspecific chest pain  Jaw pain    Rx / DC Orders ED Discharge Orders          Ordered    clindamycin (CLEOCIN) 150 MG capsule  Every 6 hours        10/15/22 1431              Terrilee Files, MD 10/15/22 1738

## 2022-10-15 NOTE — ED Notes (Signed)
Per MD pt can eat. Provided pt with saltines, peanut butter and water.

## 2022-10-15 NOTE — ED Triage Notes (Signed)
Jaw pain x1 week HTN x2 days and chest pain started today  Heaviness in chest 7/10 Radiates to back 1/2 PPD smoker

## 2022-10-15 NOTE — ED Notes (Signed)
Pt talking rapidly in triage Pt stated she has been off crack, meth and heroin for 2 weeks

## 2022-10-15 NOTE — Discharge Instructions (Addendum)
You were seen in the emergency department for chest pain jaw pain.  You had blood work EKG chest x-ray that did not show any evidence of heart injury.  Your blood work also did not show any obvious signs of infection.  We are prescribing some clindamycin for your MRSA.  Please follow-up with your primary care doctor.  Return to the emergency department if any worsening or concerning symptoms.

## 2022-11-15 ENCOUNTER — Ambulatory Visit
Admission: EM | Admit: 2022-11-15 | Discharge: 2022-11-15 | Disposition: A | Discharge status: Home / Self Care | Payer: Managed Care, Other (non HMO) | Attending: Family Medicine | Admitting: Family Medicine

## 2022-11-15 DIAGNOSIS — M79671 Pain in right foot: Secondary | ICD-10-CM | POA: Diagnosis not present

## 2022-11-15 DIAGNOSIS — S90851A Superficial foreign body, right foot, initial encounter: Secondary | ICD-10-CM

## 2022-11-15 MED ORDER — AMOXICILLIN-POT CLAVULANATE 875-125 MG PO TABS: 1.0000 | ORAL_TABLET | Freq: Two times a day (BID) | ORAL | 0 refills | Status: DC

## 2022-11-15 MED ORDER — CHLORHEXIDINE GLUCONATE 4 % EX SOLN: Freq: Every day | CUTANEOUS | 0 refills | Status: DC | PRN

## 2022-11-15 NOTE — Discharge Instructions (Signed)
You may follow up with [facility] if your foot pain does not improve

## 2022-11-15 NOTE — ED Triage Notes (Signed)
Pt c/o having a piece of glass stuck in her foot. Pt states she was on her porch yesterday when she stepped on a big piece of glass that she is unable to get out of her foot. It causes pain when she walks.

## 2022-11-15 NOTE — ED Provider Notes (Signed)
[facility]    CSN: [id number] Arrival date & time: 11/15/22  1754      History   Chief Complaint No chief complaint on file.   HPI Alexandra Floyd is a 57 y.o. female.   Patient presenting today with pain to the right plantar surface of the foot from stepping on glass last night.  She thinks that there is a large piece stuck in her foot.  She denies bleeding or drainage, numbness, tingling, loss of range of motion.  Tried to remove at home with no relief.    Past Medical History:  Diagnosis Date   Anxiety    Cellulitis    Depression    Heart murmur    Hep C w/ coma, chronic    MRSA cellulitis     Patient Active Problem List   Diagnosis Date Noted   Encounter for hepatitis C virus screening test for high risk patient 08/17/2022   Pure hypercholesterolemia 08/17/2022   Hypothyroidism (acquired) 08/17/2022   Screening for colon cancer 08/17/2022   GERD (gastroesophageal reflux disease) 08/17/2022   Substance abuse (HCC) 06/10/2021   TSH elevation 06/10/2021   Substance induced mood disorder (HCC) 09/21/2019   Polysubstance abuse (HCC) 09/21/2019   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      Home Medications    Prior to Admission medications   Medication Sig Start Date End Date Taking? Authorizing Provider  amoxicillin-clavulanate (AUGMENTIN) 875-125 MG tablet Take 1 tablet by mouth every 12 (twelve) hours. 11/15/22  Yes Particia Nearing, PA-C  chlorhexidine (HIBICLENS) 4 %  external liquid Apply topically daily as needed. 11/15/22  Yes Particia Nearing, PA-C  clindamycin (CLEOCIN) 150 MG capsule Take 1 capsule (150 mg total) by mouth every 6 (six) hours. 10/15/22   Terrilee Files, MD  levothyroxine (SYNTHROID) 25 MCG tablet Take 1 tablet (25 mcg total) by mouth daily before breakfast. 08/17/22   Gardenia Phlegm, MD  omeprazole (PRILOSEC) 20 MG capsule Take 1 capsule (20 mg total) by mouth daily. 08/17/22   Gardenia Phlegm, MD    Family History Family History  Problem Relation Age of Onset   Hypertension Mother     Social History Social History   Tobacco Use   Smoking status: Every Day    Current packs/day: 1.00    Average packs/day: 1 pack/day for 46.8 years (46.8 ttl pk-yrs)    Types: Cigarettes    Start date: 1978   Smokeless tobacco: Never  Vaping Use   Vaping status: Some Days  Substance Use Topics   Alcohol use: Not Currently    Comment: occasionally   Drug use: Not Currently    Types: Methamphetamines, "Crack" cocaine, Heroin    Comment: occasionally     Allergies   Lasix [furosemide]   Review of Systems Review of Systems Per HPI  Physical Exam Triage Vital Signs ED Triage Vitals  Encounter Vitals Group     BP 11/15/22 1839 130/87     Systolic BP Percentile --  Diastolic BP Percentile --      Pulse Rate 11/15/22 1839 80     Resp 11/15/22 1839 16     Temp 11/15/22 1839 97.7 F (36.5 C)     Temp Source 11/15/22 1839 Oral     SpO2 11/15/22 1839 94 %     Weight --      Height --      Head Circumference --      Peak Flow --      Pain Score 11/15/22 1843 8     Pain Loc --      Pain Education --      Exclude from Growth Chart --    No data found.  Updated Vital Signs BP 130/87 (BP Location: Right Arm)   Pulse 80   Temp 97.7 F (36.5 C) (Oral)   Resp 16   SpO2 94%   Visual Acuity Right Eye Distance:   Left Eye Distance:   Bilateral Distance:    Right Eye Near:   Left Eye Near:    Bilateral Near:      Physical Exam Vitals and nursing note reviewed.  Constitutional:      Appearance: Normal appearance. She is not ill-appearing.  HENT:     Head: Atraumatic.  Eyes:     Extraocular Movements: Extraocular movements intact.     Conjunctiva/sclera: Conjunctivae normal.  Cardiovascular:     Rate and Rhythm: Normal rate and regular rhythm.     Heart sounds: Normal heart sounds.  Pulmonary:     Effort: Pulmonary effort is normal.     Breath sounds: Normal breath sounds.  Musculoskeletal:        General: Tenderness and signs of injury present. Normal range of motion.     Cervical back: Normal range of motion and neck supple.  Skin:    General: Skin is warm.     Comments: Small V-shaped laceration with well-approximated wound edges to the plantar surface of the right foot.  No appreciable foreign body within wound though this is where patient feels that there is glass  Neurological:     Mental Status: She is alert and oriented to person, place, and time.     Motor: No weakness.     Gait: Gait normal.     Comments: Right foot neurovascularly intact  Psychiatric:        Mood and Affect: Mood normal.        Thought Content: Thought content normal.        Judgment: Judgment normal.      UC Treatments / Results  Labs (all labs ordered are listed, but only abnormal results are displayed) Labs Reviewed - No data to display  EKG   Radiology No results found.  Procedures Procedures (including critical care time)  Medications Ordered in UC Medications - No data to display  Initial Impression / Assessment and Plan / UC Course  I have reviewed the triage vital signs and the nursing notes.  Pertinent labs & imaging results that were available during my care of the patient were reviewed by me and considered in my medical decision making (see chart for details).     Foot was soaked in sterile water and Hibiclens, wound was explored thoroughly with forceps with no obvious  visualization of foreign body.  Unable to remove anything from wound.  Discussed home soaks, ice, elevation, over-the-counter pain relievers and will place on antibiotics and give Hibiclens for topical wound care.  Podiatry follow-up if not  resolving.  Final Clinical Impressions(s) / UC Diagnoses   Final diagnoses:  Right foot pain  Foreign body in right foot, initial encounter     Discharge Instructions      You may follow up with [facility] if your foot pain does not improve    ED Prescriptions     Medication Sig Dispense Auth. Provider   amoxicillin-clavulanate (AUGMENTIN) 875-125 MG tablet Take 1 tablet by mouth every 12 (twelve) hours. 14 tablet Particia Nearing, [place]   chlorhexidine (HIBICLENS) 4 % external liquid Apply topically daily as needed. 236 mL Particia Nearing, [place]      PDMP not reviewed this encounter.   Particia Nearing, [place] 11/15/22 [phone]

## 2022-12-12 ENCOUNTER — Other Ambulatory Visit: Payer: Self-pay

## 2022-12-12 ENCOUNTER — Encounter (HOSPITAL_COMMUNITY): Payer: Self-pay | Admitting: *Deleted

## 2022-12-12 ENCOUNTER — Emergency Department (HOSPITAL_COMMUNITY)
Admission: EM | Admit: 2022-12-12 | Discharge: 2022-12-12 | Disposition: A | Discharge status: Home / Self Care | Payer: Commercial Managed Care - HMO | Attending: Emergency Medicine | Admitting: Emergency Medicine

## 2022-12-12 DIAGNOSIS — T300 Burn of unspecified body region, unspecified degree: Secondary | ICD-10-CM

## 2022-12-12 DIAGNOSIS — T23102A Burn of first degree of left hand, unspecified site, initial encounter: Secondary | ICD-10-CM | POA: Diagnosis not present

## 2022-12-12 DIAGNOSIS — X16XXXA Contact with hot heating appliances, radiators and pipes, initial encounter: Secondary | ICD-10-CM | POA: Diagnosis not present

## 2022-12-12 DIAGNOSIS — T23002A Burn of unspecified degree of left hand, unspecified site, initial encounter: Secondary | ICD-10-CM | POA: Diagnosis present

## 2022-12-12 DIAGNOSIS — M79642 Pain in left hand: Secondary | ICD-10-CM

## 2022-12-12 MED ORDER — DOXYCYCLINE HYCLATE 100 MG PO CAPS: 100.0000 mg | ORAL_CAPSULE | Freq: Two times a day (BID) | ORAL | 0 refills | Status: DC

## 2022-12-12 MED ORDER — HYDROCODONE-ACETAMINOPHEN 5-325 MG PO TABS: 1.0000 | ORAL_TABLET | ORAL | 0 refills | Status: DC | PRN

## 2022-12-12 MED ORDER — HYDROCODONE-ACETAMINOPHEN 5-325 MG PO TABS
1.0000 | ORAL_TABLET | Freq: Once | ORAL | Status: AC
  Administered 2022-12-12: 1 via ORAL
  Filled 2022-12-12: qty 1

## 2022-12-12 MED ORDER — TRIPLE ANTIBIOTIC 3.5-400-5000 EX OINT
TOPICAL_OINTMENT | Freq: Once | CUTANEOUS | Status: AC
  Filled 2022-12-12: qty 2

## 2022-12-12 MED ORDER — DOXYCYCLINE HYCLATE 100 MG PO TABS
100.0000 mg | ORAL_TABLET | Freq: Once | ORAL | Status: AC
  Administered 2022-12-12: 100 mg via ORAL
  Filled 2022-12-12: qty 1

## 2022-12-12 MED ORDER — NEOSPORIN PLUS PAIN RELIEF MS 3.5-10000-10 EX CREA: TOPICAL_CREAM | Freq: Two times a day (BID) | CUTANEOUS | 0 refills | Status: DC

## 2022-12-12 NOTE — ED Triage Notes (Signed)
Pt c/o left hand burn from a crack pipe last week. Pt concerned it's infected because her hand feels swollen and she is now having pain up into her wrist.

## 2022-12-12 NOTE — ED Provider Notes (Signed)
[facility] AT [facility] Provider Note   CSN: [id number] Arrival date & time: 12/12/22  0935     History  Chief Complaint  Patient presents with   Hand Burn    Alexandra Floyd is a 57 y.o. female.  57 year old female with history of cocaine and methamphetamine abuse who presents emergency department with left hand pain.  Patient picked up a hot crack pipe a week ago and burned the space between her second and third digits on her left hand.  Says that this morning she woke up and thinks there may be some drainage from it as well as some slight discoloration of the skin in that area.  Has been trying Epsom salts soaking as well as Tylenol and ibuprofen.  Last tetanus shot was in July 2023.  Is ambidextrous.       Home Medications Prior to Admission medications   Medication Sig Start Date End Date Taking? Authorizing Provider  doxycycline (VIBRAMYCIN) 100 MG capsule Take 1 capsule (100 mg total) by mouth 2 (two) times daily. 12/12/22  Yes Rondel Baton, MD  HYDROcodone-acetaminophen (NORCO/VICODIN) 5-325 MG tablet Take 1 tablet by mouth every 4 (four) hours as needed. 12/12/22  Yes Rondel Baton, MD  neomycin-polymyxin-pramoxine (NEOSPORIN PLUS) 1 % cream Apply topically 2 (two) times daily. 12/12/22  Yes Rondel Baton, MD  chlorhexidine (HIBICLENS) 4 % external liquid Apply topically daily as needed. 11/15/22   Particia Nearing, PA-C  levothyroxine (SYNTHROID) 25 MCG tablet Take 1 tablet (25 mcg total) by mouth daily before breakfast. 08/17/22   Gardenia Phlegm, MD  omeprazole (PRILOSEC) 20 MG capsule Take 1 capsule (20 mg total) by mouth daily. 08/17/22   Gardenia Phlegm, MD      Allergies    Lasix [furosemide]    Review of Systems   Review of Systems  Physical Exam Updated Vital Signs BP (!) 128/98 (BP Location: Left Arm)   Pulse 74   Temp 97.8 F (36.6 C) (Oral)   Resp 19   Ht 5\' 3"  (1.6 m)   Wt 74.8 kg   SpO2 98%    BMI 29.23 kg/m  Physical Exam Musculoskeletal:     Comments: Full range of motion of left hand and fingers.  Radial pulse 2+.  Cap refill less than 2 seconds in all fingers of the left hand.  See image below for burns.  No circumferential burns noted.  No significant drainage.   Left hand between the second and third digits:          ED Results / Procedures / Treatments   Labs (all labs ordered are listed, but only abnormal results are displayed) Labs Reviewed - No data to display  EKG None  Radiology No results found.  Procedures Procedures    Medications Ordered in ED Medications  neomycin-bacitracin-polymyxin [id number] OINT ( Apply externally Given 12/12/22 1005)  HYDROcodone-acetaminophen (NORCO/VICODIN) 5-325 MG per tablet 1 tablet (1 tablet Oral Given 12/12/22 1001)  doxycycline (VIBRA-TABS) tablet 100 mg (100 mg Oral Given 12/12/22 1001)    ED Course/ Medical Decision Making/ A&P                                 Medical Decision Making Risk OTC drugs. Prescription drug management.   Alexandra Floyd is a 57 y.o. female with comorbidities that complicate the patient evaluation including cocaine and methamphetamine abuse who presents emergency  department with left hand pain after burning it on a crack pipe  Initial Ddx:  Burn, cellulitis, compartment syndrome, tetanus  MDM/Course:  Patient presents to the emergency department after she burnedbetween her second third digit on her left hand with a crack pipe a week ago.  Reports that there is no exposure or fragments that are stuck in her hand.  On exam does appear to have deep partial-thickness burns that are healing.  Does however report some drainage from these areas and there is a small amount of erythema so we will give her some doxycycline to her event infection as well as topical antibiotics.  Her tetanus shot is already up-to-date.  No circumferential burns.  Will have her follow-up with the burn  clinic at [facility].  Also given a short course of pain medication.  This patient presents to the ED for concern of complaints listed in HPI, this involves an extensive number of treatment options, and is a complaint that carries with it a high risk of complications and morbidity. Disposition including potential need for admission considered.   Dispo: DC Home. Return precautions discussed including, but not limited to, those listed in the AVS. Allowed pt time to ask questions which were answered fully prior to dc.  Additional history obtained from spouse Records reviewed Outpatient Clinic Notes I have reviewed the patients home medications and made adjustments as needed  Portions of this note were generated with Dragon dictation software. Dictation errors may occur despite best attempts at proofreading.           Final Clinical Impression(s) / ED Diagnoses Final diagnoses:  Burn  Pain of left hand    Rx / DC Orders ED Discharge Orders          Ordered    HYDROcodone-acetaminophen (NORCO/VICODIN) 5-325 MG tablet  Every 4 hours PRN        12/12/22 1004    doxycycline (VIBRAMYCIN) 100 MG capsule  2 times daily        12/12/22 1004    neomycin-polymyxin-pramoxine (NEOSPORIN PLUS) 1 % cream  2 times daily        12/12/22 1004              Rondel Baton, MD 12/12/22 1805

## 2022-12-12 NOTE — Discharge Instructions (Signed)
You are seen for the burn of your hand.  Wash with soap and water daily.  Use the Neosporin cream that we have given you.  Take the doxycycline to prevent any worsening infection.  Use Tylenol and ibuprofen for pain and Norco for breakthrough pain.  Do not take this medication before driving or drinking alcohol because it can make you drowsy.  Follow-up with the burn clinic in 1 week and come back to the emergency department for any concerning symptoms.

## 2022-12-26 NOTE — Progress Notes (Unsigned)
   Established Patient Office Visit   Subjective  Patient ID: Alexandra Floyd, female    DOB: 1966-01-23  Age: 57 y.o. MRN: [id number]  No chief complaint on file.   She  has a past medical history of Anxiety, Cellulitis, Depression, Heart murmur, Hep C w/ coma, chronic, and MRSA cellulitis.  HPI  ROS    Objective:     There were no vitals taken for this visit. {Vitals History (Optional):23777}  Physical Exam   No results found for any visits on 12/27/22.  The 10-year ASCVD risk score (Arnett DK, et al., 2019) is: 6.4%    Assessment & Plan:  There are no diagnoses linked to this encounter.  No follow-ups on file.   Cruzita Lederer Newman Nip, FNP

## 2022-12-26 NOTE — Patient Instructions (Addendum)
Great to see you today.  I have refilled the medication(s) we provide.   Lung Cancer Screening: Call our Nurse Navigator at [phone].    If labs were collected, we will inform you of lab results once received either by echart message or telephone call.   - echart message- for normal results that have been seen by the patient already.   - telephone call: abnormal results or if patient has not viewed results in their echart.   - Please take medications as prescribed. - Follow up with your primary health provider if any health concerns arises. - If symptoms worsen please contact your primary care provider and/or visit the emergency department.

## 2022-12-27 ENCOUNTER — Ambulatory Visit: Payer: Commercial Managed Care - HMO | Admitting: Family Medicine

## 2022-12-27 DIAGNOSIS — Z1211 Encounter for screening for malignant neoplasm of colon: Secondary | ICD-10-CM

## 2022-12-27 DIAGNOSIS — E039 Hypothyroidism, unspecified: Secondary | ICD-10-CM

## 2022-12-27 DIAGNOSIS — Z122 Encounter for screening for malignant neoplasm of respiratory organs: Secondary | ICD-10-CM

## 2022-12-27 DIAGNOSIS — Z1231 Encounter for screening mammogram for malignant neoplasm of breast: Secondary | ICD-10-CM

## 2023-01-11 ENCOUNTER — Encounter: Payer: Self-pay | Admitting: Family Medicine

## 2023-01-12 ENCOUNTER — Emergency Department (HOSPITAL_COMMUNITY)
Admission: EM | Admit: 2023-01-12 | Discharge: 2023-01-12 | Disposition: A | Discharge status: Home / Self Care | Payer: Commercial Managed Care - HMO | Attending: Emergency Medicine | Admitting: Emergency Medicine

## 2023-01-12 ENCOUNTER — Emergency Department (HOSPITAL_COMMUNITY): Payer: Commercial Managed Care - HMO

## 2023-01-12 ENCOUNTER — Encounter (HOSPITAL_COMMUNITY): Payer: Self-pay

## 2023-01-12 ENCOUNTER — Other Ambulatory Visit: Payer: Self-pay

## 2023-01-12 DIAGNOSIS — S61250A Open bite of right index finger without damage to nail, initial encounter: Secondary | ICD-10-CM | POA: Insufficient documentation

## 2023-01-12 DIAGNOSIS — W540XXA Bitten by dog, initial encounter: Secondary | ICD-10-CM | POA: Insufficient documentation

## 2023-01-12 DIAGNOSIS — R051 Acute cough: Secondary | ICD-10-CM | POA: Insufficient documentation

## 2023-01-12 MED ORDER — ALBUTEROL SULFATE HFA 108 (90 BASE) MCG/ACT IN AERS
2.0000 | INHALATION_SPRAY | Freq: Once | RESPIRATORY_TRACT | Status: AC
  Administered 2023-01-12: 2 via RESPIRATORY_TRACT
  Filled 2023-01-12: qty 6.7

## 2023-01-12 MED ORDER — OXYCODONE-ACETAMINOPHEN 5-325 MG PO TABS
1.0000 | ORAL_TABLET | Freq: Once | ORAL | Status: AC
  Administered 2023-01-12: 1 via ORAL
  Filled 2023-01-12: qty 1

## 2023-01-12 MED ORDER — BENZONATATE 100 MG PO CAPS
200.0000 mg | ORAL_CAPSULE | Freq: Once | ORAL | Status: AC
  Administered 2023-01-12: 200 mg via ORAL
  Filled 2023-01-12: qty 2

## 2023-01-12 MED ORDER — AMOXICILLIN-POT CLAVULANATE 875-125 MG PO TABS: 1.0000 | ORAL_TABLET | Freq: Two times a day (BID) | ORAL | 0 refills | Status: DC

## 2023-01-12 MED ORDER — AMOXICILLIN-POT CLAVULANATE 875-125 MG PO TABS
1.0000 | ORAL_TABLET | Freq: Once | ORAL | Status: AC
  Administered 2023-01-12: 1 via ORAL
  Filled 2023-01-12: qty 1

## 2023-01-12 MED ORDER — HYDROCODONE-ACETAMINOPHEN 5-325 MG PO TABS: ORAL_TABLET | ORAL | 0 refills | Status: DC

## 2023-01-12 MED ORDER — BENZONATATE 200 MG PO CAPS: 200.0000 mg | ORAL_CAPSULE | Freq: Three times a day (TID) | ORAL | 0 refills | Status: DC | PRN

## 2023-01-12 NOTE — ED Provider Notes (Signed)
[facility] AT [facility] Provider Note   CSN: [id number] Arrival date & time: 01/12/23  0935     History  Chief Complaint  Patient presents with   Cough    Alexandra Floyd is a 57 y.o. female.   Cough Associated symptoms: no chest pain, no chills, no fever, no headaches, no shortness of breath, no sore throat and no wheezing        Alexandra Floyd is a 57 y.o. female who presents to the Emergency Department complaining of cough and chest congestion for 2 days.  States cough is persistent, mostly nonproductive.  Worse at night when lying down.  States her sister was recently diagnosed with pneumonia and she is concerned because she began having similar symptoms.  Denies any fever, shortness of breath.  Just feels sore but states this is only associated with coughing.  She is also complaining of pain to her right index finger for 1 week.  States she was bitten by her own dog.  Did not seek treatment initially.  Has put antibiotic ointment on the wound but is now having some pain to her finger.  She denies any fever, chills, red streaking or swelling of her finger.  Dogs immunizations are up-to-date, patient's tetanus status is up-to-date.   Home Medications Prior to Admission medications   Medication Sig Start Date End Date Taking? Authorizing Provider  chlorhexidine (HIBICLENS) 4 % external liquid Apply topically daily as needed. 11/15/22   Particia Nearing, PA-C  doxycycline (VIBRAMYCIN) 100 MG capsule Take 1 capsule (100 mg total) by mouth 2 (two) times daily. 12/12/22   Rondel Baton, MD  HYDROcodone-acetaminophen (NORCO/VICODIN) 5-325 MG tablet Take 1 tablet by mouth every 4 (four) hours as needed. 12/12/22   Rondel Baton, MD  levothyroxine (SYNTHROID) 25 MCG tablet Take 1 tablet (25 mcg total) by mouth daily before breakfast. 08/17/22   Gardenia Phlegm, MD  neomycin-polymyxin-pramoxine (NEOSPORIN PLUS) 1 % cream Apply topically 2  (two) times daily. 12/12/22   Rondel Baton, MD  omeprazole (PRILOSEC) 20 MG capsule Take 1 capsule (20 mg total) by mouth daily. 08/17/22   Gardenia Phlegm, MD      Allergies    Lasix [furosemide]    Review of Systems   Review of Systems  Constitutional:  Negative for chills and fever.  HENT:  Positive for congestion. Negative for sneezing and sore throat.   Respiratory:  Positive for cough. Negative for shortness of breath and wheezing.   Cardiovascular:  Negative for chest pain and leg swelling.  Gastrointestinal:  Negative for abdominal pain, nausea and vomiting.  Genitourinary:  Negative for dysuria and flank pain.  Musculoskeletal:  Positive for arthralgias. Negative for neck pain and neck stiffness.  Skin:  Positive for wound.       Dog bite right index finger  Neurological:  Negative for dizziness, weakness and headaches.    Physical Exam Updated Vital Signs BP (!) 147/103 (BP Location: Left Arm)   Pulse 76   Temp 98.7 F (37.1 C) (Oral)   Resp 18   Ht 5\' 3"  (1.6 m)   Wt 74.8 kg   SpO2 98%   BMI 29.21 kg/m  Physical Exam Vitals and nursing note reviewed.  Constitutional:      General: She is not in acute distress.    Appearance: Normal appearance. She is not ill-appearing or toxic-appearing.  Cardiovascular:     Rate and Rhythm: Normal rate and regular rhythm.  Pulses: Normal pulses.  Pulmonary:     Effort: Pulmonary effort is normal. No respiratory distress.     Breath sounds: No rhonchi or rales.  Abdominal:     General: There is no distension.     Palpations: Abdomen is soft.  Musculoskeletal:        General: Tenderness present.     Cervical back: Normal range of motion.     Comments: Sub acute appearing wound to the lateral aspect of the distal right index finger.  No drainage noted.  No significant edema, no nail involvement.  No lymphangitis.  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   Lymphadenopathy:     Cervical: No cervical adenopathy.  Skin:    General: Skin is warm.     Capillary Refill: Capillary refill takes less than 2 seconds.     Findings: No erythema.  Neurological:     General: No focal deficit present.     Mental Status: She is alert.     Sensory: No sensory deficit.     Motor: No weakness.     Coordination: Coordination normal.     ED Results / Procedures / Treatments   Labs (all labs ordered are listed, but only abnormal results are displayed) Labs Reviewed - No data to display  EKG None  Radiology DG Finger Index Right Result Date: 01/12/2023 CLINICAL DATA:  Index finger bite EXAM: RIGHT INDEX FINGER 2+V COMPARISON:  None Available. FINDINGS: No evidence of acute fracture or dislocation. No erosion or periosteal elevation. 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 Soft tissue swelling of the index finger. No radiopaque foreign body is seen. IMPRESSION: 1. Soft tissue swelling of the index finger. No radiopaque foreign body. 2. No acute osseous abnormality. 3. 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 Electronically Signed   By: Duanne Guess D.O.   On: 01/12/2023 10:43   DG Chest 2 View Result Date: 01/12/2023 CLINICAL DATA:  Cough, chest pain EXAM: CHEST - 2 VIEW COMPARISON:  10/15/2022 FINDINGS: The heart size and mediastinal contours are within normal limits. Both lungs are clear. The visualized skeletal structures are unremarkable. IMPRESSION: No active cardiopulmonary disease. Electronically Signed   By: Duanne Guess D.O.   On: 01/12/2023 10:38    Procedures Procedures    Medications Ordered in ED Medications - No data to display  ED Course/ Medical Decision Making/ A&P                                 Medical Decision Making Patient with complaint of cough chest congestion for 2 days.  Endorses sick contact recently.   Cough mostly nonproductive.  No fever or shortness of breath.  Describes chest feeling sore but only associated with coughing.  Vital signs reassuring here.  No tachycardia tachypnea or hypoxia.  Also request evaluation of dog bite to right index finger that occurred a week ago.  Bitten by her own dog.  States dog's immunizations are up-to-date.  She has been applying over-the-counter antibiotic ointment with minimal relief.  Now having pain with movement of her finger.  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  No worsening deformity.  I suspect symptoms are viral.  Pneumonia PCR also considered but felt less likely.  Lungs are clear to auscultation on my exam.  No increased work of breathing.  Wound is to the lateral aspect of the distal finger.  There is no nail involvement, extremity appears neurovascularly intact.  Differential would include but not limited to infected wound, septic joint.  Fracture, osteomyelitis.  Amount and/or Complexity of Data Reviewed Radiology: ordered.    Details: Chest x-ray without acute cardiopulmonary process.  X-ray of the right index finger without evidence of fracture dislocation or osteomyelitis.  No radiopaque foreign body.  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 Discussion of management or test interpretation with external provider(s): On review of medical records, patient's tetanus is up-to-date.  States that dog's rabies vaccines are UTD.  No evidence of cellulitis on exam today.  Will treat with Augmentin.  Albuterol MDI dispensed here.  Prescription written for Augmentin, Tessalon Perles and short course of pain medication.  Database was reviewed.  Patient to follow-up with PCP and orthopedics regarding her finger.  Risk Prescription drug management.           Final Clinical Impression(s) / ED Diagnoses Final diagnoses:  Acute cough  Dog bite of index  finger, initial encounter    Rx / DC Orders ED Discharge Orders     None         Pauline Aus, PA-C 01/12/23 1121    Terrilee Files, MD 01/13/23 1724

## 2023-01-12 NOTE — ED Triage Notes (Signed)
Pt c/o cough, congestion, no fever, but chest soreness from cough for about two days. Pt states sister was diagnosed with pneumonia and she has started having similar symptoms. Pt also wants her finger check out from a dog bite five days ago.

## 2023-01-12 NOTE — Discharge Instructions (Signed)
Keep the finger clean with mild soap and water.  I recommend that you keep it bandaged.  The antibiotic as directed.  Use the albuterol inhaler, 1 to 2 puffs every 4-6 hours as needed.  Follow-up with your primary care provider for recheck regarding your cough.  Call the orthopedic provider listed to arrange follow-up appointment about your finger.

## 2023-01-12 NOTE — ED Notes (Signed)
Per pt, the dog that bit here was her dog--pt states animal control has already been contacted pertaining to the situation and instructions given to her by animal control

## 2023-01-16 ENCOUNTER — Other Ambulatory Visit: Payer: Self-pay

## 2023-01-16 ENCOUNTER — Ambulatory Visit
Admission: EM | Admit: 2023-01-16 | Discharge: 2023-01-16 | Discharge status: Against Medical Advice | Payer: Commercial Managed Care - HMO

## 2023-01-16 DIAGNOSIS — Z5321 Procedure and treatment not carried out due to patient leaving prior to being seen by health care provider: Secondary | ICD-10-CM

## 2023-01-16 DIAGNOSIS — R079 Chest pain, unspecified: Secondary | ICD-10-CM | POA: Diagnosis not present

## 2023-01-16 NOTE — ED Triage Notes (Signed)
Pt states she started having chest pain when breathing, cough x  2 weeks ago but has gotten worse . SOB with exertion. Pt went to the ER 2 weeks ago and was prescribed amoxicillin and she did not take it.   Pt states her levothyroxine ran out a week ago.

## 2023-01-16 NOTE — ED Provider Notes (Signed)
[facility]    CSN: [id number] Arrival date & time: 01/16/23  1325      History   Chief Complaint Chief Complaint  Patient presents with   Chest Pain   Shortness of Breath    HPI Alexandra Floyd is a 57 y.o. female.   HPI  Erroneous encounter, patient left without being seen Past Medical History:  Diagnosis Date   Anxiety    Cellulitis    Depression    Heart murmur    Hep C w/ coma, chronic    MRSA cellulitis     Patient Active Problem List   Diagnosis Date Noted   Encounter for hepatitis C virus screening test for high risk patient 08/17/2022   Pure hypercholesterolemia 08/17/2022   Hypothyroidism (acquired) 08/17/2022   Screening for colon cancer 08/17/2022   GERD (gastroesophageal reflux disease) 08/17/2022   Substance abuse (HCC) 06/10/2021   TSH elevation 06/10/2021   Substance induced mood disorder (HCC) 09/21/2019   Polysubstance abuse (HCC) 09/21/2019   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      Home Medications    Prior to Admission medications   Medication Sig Start Date End Date Taking? Authorizing Provider  benzonatate (TESSALON) 200 MG capsule Take 1 capsule (200 mg total) by mouth 3 (three) times daily as needed for cough. Swallow whole, do not chew 01/12/23  Yes Triplett, Tammy, PA-C  levothyroxine (SYNTHROID) 25 MCG tablet Take 1 tablet (25 mcg total) by mouth daily before breakfast. 08/17/22  Yes Gardenia Phlegm, MD  amoxicillin-clavulanate (AUGMENTIN) 875-125 MG  tablet Take 1 tablet by mouth every 12 (twelve) hours. Patient not taking: Reported on 01/16/2023 01/12/23   Pauline Aus, PA-C  chlorhexidine (HIBICLENS) 4 % external liquid Apply topically daily as needed. 11/15/22   Particia Nearing, PA-C  HYDROcodone-acetaminophen (NORCO/VICODIN) 5-325 MG tablet Take one tab po q 4 hrs prn pain 01/12/23   Triplett, Tammy, PA-C  neomycin-polymyxin-pramoxine (NEOSPORIN PLUS) 1 % cream Apply topically 2 (two) times daily. 12/12/22   Rondel Baton, MD  omeprazole (PRILOSEC) 20 MG capsule Take 1 capsule (20 mg total) by mouth daily. 08/17/22   Gardenia Phlegm, MD    Family History Family History  Problem Relation Age of Onset   Hypertension Mother     Social History Social History   Tobacco Use   Smoking status: Every Day    Current packs/day: 1.00    Average packs/day: 1 pack/day for 47.0 years (47.0 ttl pk-yrs)    Types: Cigarettes    Start date: 1978   Smokeless tobacco: Never  Vaping Use   Vaping status: Some Days  Substance Use Topics   Alcohol use: Not Currently    Comment: occasionally   Drug use: Not Currently    Types: Methamphetamines, "Crack" cocaine, Heroin    Comment: occasionally     Allergies   Lasix [furosemide]   Review of Systems Review of Systems   Physical Exam Triage Vital Signs ED Triage Vitals  Encounter Vitals Group  BP 01/16/23 1334 128/85     Systolic BP Percentile --      Diastolic BP Percentile --      Pulse Rate 01/16/23 1334 88     Resp 01/16/23 1334 20     Temp 01/16/23 1334 97.6 F (36.4 C)     Temp Source 01/16/23 1334 Oral     SpO2 01/16/23 1334 96 %     Weight --      Height --      Head Circumference --      Peak Flow --      Pain Score 01/16/23 1340 8     Pain Loc --      Pain Education --      Exclude from Growth Chart --    No data found.  Updated Vital Signs BP 128/85 (BP Location: Right Arm)   Pulse 88   Temp 97.6 F (36.4 C) (Oral)   Resp 20   SpO2 96%    Visual Acuity Right Eye Distance:   Left Eye Distance:   Bilateral Distance:    Right Eye Near:   Left Eye Near:    Bilateral Near:     Physical Exam   UC Treatments / Results  Labs (all labs ordered are listed, but only abnormal results are displayed) Labs Reviewed - No data to display  EKG   Radiology No results found.  Procedures Procedures (including critical care time)  Medications Ordered in UC Medications - No data to display  Initial Impression / Assessment and Plan / UC Course  I have reviewed the triage vital signs and the nursing notes.  Pertinent labs & imaging results that were available during my care of the patient were reviewed by me and considered in my medical decision making (see chart for details).   Final Clinical Impressions(s) / UC Diagnoses   Final diagnoses:  None   Discharge Instructions   None    ED Prescriptions   None    PDMP not reviewed this encounter.   Abran Cantor, NP 01/16/23 1547

## 2023-01-21 ENCOUNTER — Emergency Department (HOSPITAL_COMMUNITY): Payer: Commercial Managed Care - HMO

## 2023-01-21 ENCOUNTER — Encounter (HOSPITAL_COMMUNITY): Payer: Self-pay

## 2023-01-21 ENCOUNTER — Emergency Department (HOSPITAL_COMMUNITY)
Admission: EM | Admit: 2023-01-21 | Discharge: 2023-01-22 | Disposition: A | Discharge status: Home / Self Care | Payer: Commercial Managed Care - HMO | Attending: Emergency Medicine | Admitting: Emergency Medicine

## 2023-01-21 ENCOUNTER — Other Ambulatory Visit: Payer: Self-pay

## 2023-01-21 DIAGNOSIS — E039 Hypothyroidism, unspecified: Secondary | ICD-10-CM | POA: Diagnosis not present

## 2023-01-21 DIAGNOSIS — F17210 Nicotine dependence, cigarettes, uncomplicated: Secondary | ICD-10-CM | POA: Insufficient documentation

## 2023-01-21 DIAGNOSIS — R0602 Shortness of breath: Secondary | ICD-10-CM | POA: Diagnosis present

## 2023-01-21 DIAGNOSIS — J189 Pneumonia, unspecified organism: Secondary | ICD-10-CM | POA: Insufficient documentation

## 2023-01-21 DIAGNOSIS — I2510 Atherosclerotic heart disease of native coronary artery without angina pectoris: Secondary | ICD-10-CM | POA: Insufficient documentation

## 2023-01-21 DIAGNOSIS — Z1152 Encounter for screening for COVID-19: Secondary | ICD-10-CM | POA: Diagnosis not present

## 2023-01-21 LAB — BASIC METABOLIC PANEL
Anion gap: 8 (ref 5–15)
BUN: 14 mg/dL (ref 6–20)
CO2: 25 mmol/L (ref 22–32)
Calcium: 9.4 mg/dL (ref 8.9–10.3)
Chloride: 101 mmol/L (ref 98–111)
Creatinine, Ser: 0.86 mg/dL (ref 0.44–1.00)
GFR, Estimated: >60 mL/min (ref >60)
Glucose, Bld: 99 mg/dL (ref 70–99)
Potassium: 3.5 mmol/L (ref 3.5–5.1)
Sodium: 134 mmol/L — ABNORMAL LOW (ref 135–145)

## 2023-01-21 LAB — RESP PANEL BY RT-PCR (RSV, FLU A&B, COVID)  RVPGX2
Influenza A by PCR: NEGATIVE
Influenza B by PCR: NEGATIVE
Resp Syncytial Virus by PCR: NEGATIVE
SARS Coronavirus 2 by RT PCR: NEGATIVE

## 2023-01-21 LAB — CBC
HCT: 40.4 % (ref 36.0–46.0)
Hemoglobin: 13.3 g/dL (ref 12.0–15.0)
MCH: 31.4 pg (ref 26.0–34.0)
MCHC: 32.9 g/dL (ref 30.0–36.0)
MCV: 95.3 fL (ref 80.0–100.0)
Platelets: 322 10*3/uL (ref 150–400)
RBC: 4.24 MIL/uL (ref 3.87–5.11)
RDW: 12.7 % (ref 11.5–15.5)
WBC: 5.2 10*3/uL (ref 4.0–10.5)
nRBC: 0 % (ref 0.0–0.2)

## 2023-01-21 LAB — TROPONIN I (HIGH SENSITIVITY)
Troponin I (High Sensitivity): 2 ng/L (ref <18)
Troponin I (High Sensitivity): 2 ng/L (ref <18)

## 2023-01-21 MED ORDER — AZITHROMYCIN 250 MG PO TABS: 250.0000 mg | ORAL_TABLET | Freq: Every day | ORAL | 0 refills | Status: DC

## 2023-01-21 MED ORDER — AZITHROMYCIN 250 MG PO TABS
500.0000 mg | ORAL_TABLET | Freq: Once | ORAL | Status: AC
  Administered 2023-01-22: 500 mg via ORAL
  Filled 2023-01-21: qty 2

## 2023-01-21 MED ORDER — BENZONATATE 100 MG PO CAPS: 200.0000 mg | ORAL_CAPSULE | Freq: Three times a day (TID) | ORAL | 0 refills | Status: DC | PRN

## 2023-01-21 MED ORDER — SODIUM CHLORIDE 0.9 % IV SOLN
1.0000 g | Freq: Once | INTRAVENOUS | Status: AC
  Administered 2023-01-21: 1 g via INTRAVENOUS
  Filled 2023-01-21: qty 10

## 2023-01-21 MED ORDER — ACETAMINOPHEN 325 MG PO TABS
650.0000 mg | ORAL_TABLET | Freq: Once | ORAL | Status: AC
  Administered 2023-01-21: 650 mg via ORAL
  Filled 2023-01-21: qty 2

## 2023-01-21 NOTE — ED Triage Notes (Signed)
Cough, SOB and body aches x several days  CP started at 2pm Squeezing like pain Pain 9/10 Pain in top center of chest

## 2023-01-21 NOTE — ED Notes (Signed)
Patient transported to X-ray 

## 2023-01-21 NOTE — ED Notes (Signed)
Pt c/o painRaynelle Fanning, PA made aware - new orders received.

## 2023-01-21 NOTE — Discharge Instructions (Addendum)
Take your next dose of Zithromax tomorrow evening.  You have also been prescribed Tessalon which should help you with your cough.  Rest and make sure you are drinking plenty of fluids.  Please get rechecked if you develop any worsening symptoms including fever, increased shortness of breath or weakness.  It is recommended you have a repeat Chest CT scan in 4 weeks to confirm your lungs have returned to normal.

## 2023-01-21 NOTE — ED Provider Notes (Signed)
[facility] Provider Note   CSN: [id number] Arrival date & time: 01/21/23  1758     History  Chief Complaint  Patient presents with   Chest Pain    Zaelah Mugford is a 57 y.o. female With a history of CAD, GERD, hypothyroidism, history of substance abuse presenting for evaluation of chest pain along with shortness of breath, wet sounding cough which has been nonproductive, generalized bodyaches with chills but no documented fevers over the past several days.  She states she was in the shower today when she developed tightness and increased shortness of breath in her upper middle chest region which prompted her ED visit tonight.  She has had no treatment prior to arrival.  She is a smoker, she states it takes about 2 to 3 days to smoke 1 pack of cigarettes.  She does report generalized myalgias.  She has had no treatment prior to arrival.  The history is provided by the patient.       Home Medications Prior to Admission medications   Medication Sig Start Date End Date Taking? Authorizing Provider  azithromycin (ZITHROMAX) 250 MG tablet Take 1 tablet (250 mg total) by mouth daily. Take 1 tablet daily for 4 days 01/21/23  Yes Altus Zaino, Raynelle Fanning, PA-C  benzonatate (TESSALON) 100 MG capsule Take 2 capsules (200 mg total) by mouth 3 (three) times daily as needed. 01/21/23  Yes Enmanuel Zufall, Raynelle Fanning, PA-C  amoxicillin-clavulanate (AUGMENTIN) 875-125 MG tablet Take 1 tablet by mouth every 12 (twelve) hours. Patient not taking: Reported on 01/16/2023 01/12/23   Pauline Aus, PA-C  chlorhexidine (HIBICLENS) 4 % external liquid Apply topically daily as needed. 11/15/22   Particia Nearing, PA-C  HYDROcodone-acetaminophen (NORCO/VICODIN) 5-325 MG tablet Take one tab po q 4 hrs prn pain 01/12/23   Triplett, Tammy, PA-C  levothyroxine (SYNTHROID) 25 MCG tablet Take 1 tablet (25 mcg total) by mouth daily before breakfast. 08/17/22   Gardenia Phlegm, MD   neomycin-polymyxin-pramoxine (NEOSPORIN PLUS) 1 % cream Apply topically 2 (two) times daily. 12/12/22   Rondel Baton, MD  omeprazole (PRILOSEC) 20 MG capsule Take 1 capsule (20 mg total) by mouth daily. 08/17/22   Gardenia Phlegm, MD      Allergies    Lasix [furosemide]    Review of Systems   Review of Systems  Constitutional:  Positive for chills. Negative for fever.  HENT:  Negative for congestion and sore throat.   Eyes: Negative.   Respiratory:  Positive for cough, chest tightness and shortness of breath.   Cardiovascular:  Positive for chest pain.  Gastrointestinal:  Negative for abdominal pain, nausea and vomiting.  Genitourinary: Negative.   Musculoskeletal:  Positive for myalgias. Negative for arthralgias, joint swelling and neck pain.  Skin: Negative.  Negative for rash and wound.  Neurological:  Negative for dizziness, weakness, light-headedness, numbness and headaches.  Psychiatric/Behavioral: Negative.      Physical Exam Updated Vital Signs BP 110/80   Pulse 75   Temp 98.9 F (37.2 C) (Oral)   Resp 17   Ht 5\' 3"  (1.6 m)   Wt 74.8 kg   SpO2 95%   BMI 29.23 kg/m  Physical Exam Vitals and nursing note reviewed.  Constitutional:      Appearance: She is well-developed.  HENT:     Head: Normocephalic and atraumatic.  Eyes:     Conjunctiva/sclera: Conjunctivae normal.  Cardiovascular:     Rate and Rhythm: Normal rate and regular rhythm.  Heart sounds: Normal heart sounds.  Pulmonary:     Effort: Pulmonary effort is normal. No tachypnea.     Breath sounds: Examination of the right-lower field reveals decreased breath sounds. Examination of the left-lower field reveals decreased breath sounds. Decreased breath sounds present. No wheezing, rhonchi or rales.  Abdominal:     General: Bowel sounds are normal.     Palpations: Abdomen is soft.     Tenderness: There is no abdominal tenderness.  Musculoskeletal:        General: Normal range of motion.      Cervical back: Normal range of motion.  Skin:    General: Skin is warm and dry.  Neurological:     Mental Status: She is alert.     ED Results / Procedures / Treatments   Labs (all labs ordered are listed, but only abnormal results are displayed) Labs Reviewed  BASIC METABOLIC PANEL - Abnormal; Notable for the following components:      Result Value   Sodium 134 (*)    All other components within normal limits  RESP PANEL BY RT-PCR (RSV, FLU A&B, COVID)  RVPGX2  CBC  TROPONIN I (HIGH SENSITIVITY)  TROPONIN I (HIGH SENSITIVITY)    EKG EKG Interpretation Date/Time:  Saturday January 21 2023 18:05:52 EST Ventricular Rate:  87 PR Interval:  172 QRS Duration:  72 QT Interval:  358 QTC Calculation: 430 R Axis:   72  Text Interpretation: Normal sinus rhythm Normal ECG When compared with ECG of 16-Jan-2023 13:39, No significant change was found Confirmed by Benjiman Core [phone]) on 01/21/2023 6:06:26 PM  Radiology CT Chest Wo Contrast Result Date: 01/21/2023 CLINICAL DATA:  Cough and shortness of breath with body aches several days. EXAM: CT CHEST WITHOUT CONTRAST TECHNIQUE: Multidetector CT imaging of the chest was performed following the standard protocol without IV contrast. RADIATION DOSE REDUCTION: This exam was performed according to the departmental dose-optimization program which includes automated exposure control, adjustment of the mA and/or kV according to patient size and/or use of iterative reconstruction technique. COMPARISON:  CT abdomen 08/20/2021 FINDINGS: Cardiovascular: Heart is normal size. Calcified plaque over the left anterior descending coronary artery. Thoracic aorta is normal in caliber. Pulmonary arterial system and remaining vascular structures are unremarkable. Mediastinum/Nodes: No mediastinal or hilar adenopathy. Remaining mediastinal structures are unremarkable. Lungs/Pleura: Lungs are adequately inflated. Partial collapse of the left lower lobe with  associated bronchiectatic change in this region. No effusion. Mild peripheral ground-glass airspace attenuation over the right upper lobe/apex and minimally in the right lower lobe which may be due to an inflammatory infectious process. Right lung is clear. Upper Abdomen: Previous cholecystectomy. Minimal calcified plaque over the abdominal aorta. No acute findings. Musculoskeletal: No focal abnormality. IMPRESSION: 1. Mild peripheral ground-glass airspace attenuation over the right upper lobe/apex and minimally in the right lower lobe which may be due to an inflammatory infectious process. Recommend follow-up CT 4-6 weeks. 2. Partial collapse of the left lower lobe with associated bronchiectatic change. 3. Aortic atherosclerosis. Atherosclerotic coronary artery disease. Aortic Atherosclerosis (ICD10-I70.0). Electronically Signed   By: Elberta Fortis M.D.   On: 01/21/2023 20:08   DG Chest 2 View Result Date: 01/21/2023 CLINICAL DATA:  Cough, shortness of breath, body aches and chest pain. EXAM: CHEST - 2 VIEW COMPARISON:  January 12, 2023 FINDINGS: The heart size and mediastinal contours are within normal limits. Mild atelectasis and/or infiltrate is suspected within the left lung base. The left hemidiaphragm and outline of the descending thoracic aorta  are obscured. This represents a new finding when compared to the prior study. No pleural effusion is identified. Radiopaque surgical clips are seen within the right upper quadrant. No acute osseous abnormality is identified. IMPRESSION: Findings suspicious for mild left basilar atelectasis and/or infiltrate. Partial left lower lobe collapse cannot completely be excluded. Further evaluation with chest CT is recommended. Electronically Signed   By: Aram Candela M.D.   On: 01/21/2023 18:44    Procedures Procedures    Medications Ordered in ED Medications  azithromycin (ZITHROMAX) tablet 500 mg (has no administration in time range)  cefTRIAXone  (ROCEPHIN) 1 g in sodium chloride 0.9 % 100 mL IVPB (0 g Intravenous Stopped 01/21/23 2157)  acetaminophen (TYLENOL) tablet 650 mg (650 mg Oral Given 01/21/23 2028)    ED Course/ Medical Decision Making/ A&P                                 Medical Decision Making Patient presenting with flulike symptoms for the past several days, cough, increasing shortness of breath generalized bodyaches and subjective fever.  She developed squeezing pain in her upper chest with increased shortness of breath when in the shower this afternoon.  Her vital signs here have been stable, normal pulse ox, pulse rate and respiratory rate.  She does have a rhonchorous sounding cough although her respiratory exam is fairly normal with slightly reduced breath sounds at her bilateral bases.  No wheezing.  Differential diagnoses including pneumonia, acute bronchitis, viral syndrome such as COVID or influenza.  This could also be associated with CAD,  Amount and/or Complexity of Data Reviewed Labs: ordered.    Details: Respiratory panel is negative, delta troponins are also negative x 2, her be met and CBC are reassuring. Radiology: ordered.    Details: Chest x-ray suggesting left atelectasis versus infiltrate.  Imaging suggesting possible left lower lobe collapse, CT was recommended and was completed, CT scan confirms she actually has a right upper groundglass appearance suggesting pneumonia, bronchiectatic change in the left lower lobe.  Risk OTC drugs. Prescription drug management.           Final Clinical Impression(s) / ED Diagnoses Final diagnoses:  Community acquired pneumonia of right lung, unspecified part of lung    Rx / DC Orders ED Discharge Orders          Ordered    azithromycin (ZITHROMAX) 250 MG tablet  Daily        01/21/23 2255    benzonatate (TESSALON) 100 MG capsule  3 times daily PRN        01/21/23 2255              Burgess Amor, PA-C 01/21/23 2304    Benjiman Core,  MD 01/21/23 [phone]

## 2023-01-22 DIAGNOSIS — J189 Pneumonia, unspecified organism: Secondary | ICD-10-CM | POA: Diagnosis not present

## 2023-01-26 ENCOUNTER — Emergency Department (HOSPITAL_COMMUNITY): Payer: Commercial Managed Care - HMO

## 2023-01-26 ENCOUNTER — Other Ambulatory Visit: Payer: Self-pay

## 2023-01-26 ENCOUNTER — Encounter (HOSPITAL_COMMUNITY): Payer: Self-pay

## 2023-01-26 ENCOUNTER — Emergency Department (HOSPITAL_COMMUNITY)
Admission: EM | Admit: 2023-01-26 | Discharge: 2023-01-26 | Discharge status: Against Medical Advice | Payer: Commercial Managed Care - HMO | Attending: Emergency Medicine | Admitting: Emergency Medicine

## 2023-01-26 DIAGNOSIS — Z5321 Procedure and treatment not carried out due to patient leaving prior to being seen by health care provider: Secondary | ICD-10-CM | POA: Diagnosis not present

## 2023-01-26 DIAGNOSIS — R059 Cough, unspecified: Secondary | ICD-10-CM | POA: Insufficient documentation

## 2023-01-26 DIAGNOSIS — R0602 Shortness of breath: Secondary | ICD-10-CM | POA: Diagnosis not present

## 2023-01-26 LAB — BASIC METABOLIC PANEL
Anion gap: 7 (ref 5–15)
BUN: 12 mg/dL (ref 6–20)
CO2: 25 mmol/L (ref 22–32)
Calcium: 9.6 mg/dL (ref 8.9–10.3)
Chloride: 105 mmol/L (ref 98–111)
Creatinine, Ser: 0.64 mg/dL (ref 0.44–1.00)
GFR, Estimated: >60 mL/min (ref >60)
Glucose, Bld: 107 mg/dL — ABNORMAL HIGH (ref 70–99)
Potassium: 4.2 mmol/L (ref 3.5–5.1)
Sodium: 137 mmol/L (ref 135–145)

## 2023-01-26 LAB — CBC WITH DIFFERENTIAL/PLATELET
Abs Immature Granulocytes: 0.01 10*3/uL (ref 0.00–0.07)
Basophils Absolute: 0.1 10*3/uL (ref 0.0–0.1)
Basophils Relative: 1 %
Eosinophils Absolute: 0.1 10*3/uL (ref 0.0–0.5)
Eosinophils Relative: 1 %
HCT: 43.4 % (ref 36.0–46.0)
Hemoglobin: 14 g/dL (ref 12.0–15.0)
Immature Granulocytes: 0 %
Lymphocytes Relative: 27 %
Lymphs Abs: 1.4 10*3/uL (ref 0.7–4.0)
MCH: 31 pg (ref 26.0–34.0)
MCHC: 32.3 g/dL (ref 30.0–36.0)
MCV: 96.2 fL (ref 80.0–100.0)
Monocytes Absolute: 0.4 10*3/uL (ref 0.1–1.0)
Monocytes Relative: 9 %
Neutro Abs: 3 10*3/uL (ref 1.7–7.7)
Neutrophils Relative %: 62 %
Platelets: 346 10*3/uL (ref 150–400)
RBC: 4.51 MIL/uL (ref 3.87–5.11)
RDW: 12.5 % (ref 11.5–15.5)
WBC: 4.9 10*3/uL (ref 4.0–10.5)
nRBC: 0 % (ref 0.0–0.2)

## 2023-01-26 NOTE — ED Triage Notes (Signed)
Pt reports she was diagnosed with pneumonia 2 weeks ago and completed a zpack but still does not feel well.

## 2023-01-27 ENCOUNTER — Emergency Department (HOSPITAL_COMMUNITY)
Admission: EM | Admit: 2023-01-27 | Discharge: 2023-01-27 | Discharge status: Against Medical Advice | Payer: Commercial Managed Care - HMO | Attending: Emergency Medicine | Admitting: Emergency Medicine

## 2023-01-27 ENCOUNTER — Encounter (HOSPITAL_COMMUNITY): Payer: Self-pay

## 2023-01-27 ENCOUNTER — Other Ambulatory Visit: Payer: Self-pay

## 2023-01-27 DIAGNOSIS — Z5321 Procedure and treatment not carried out due to patient leaving prior to being seen by health care provider: Secondary | ICD-10-CM | POA: Insufficient documentation

## 2023-01-27 DIAGNOSIS — R300 Dysuria: Secondary | ICD-10-CM | POA: Insufficient documentation

## 2023-01-27 NOTE — ED Triage Notes (Signed)
Pt reports dysuria that started yesterday and got worse today, thinks she has a UTI

## 2023-02-07 ENCOUNTER — Encounter (HOSPITAL_COMMUNITY): Payer: Self-pay

## 2023-02-07 ENCOUNTER — Emergency Department (HOSPITAL_COMMUNITY)
Admission: EM | Admit: 2023-02-07 | Discharge: 2023-02-07 | Discharge status: Against Medical Advice | Payer: Commercial Managed Care - HMO | Attending: Emergency Medicine | Admitting: Emergency Medicine

## 2023-02-07 DIAGNOSIS — Z5329 Procedure and treatment not carried out because of patient's decision for other reasons: Secondary | ICD-10-CM | POA: Insufficient documentation

## 2023-02-07 DIAGNOSIS — R109 Unspecified abdominal pain: Secondary | ICD-10-CM | POA: Insufficient documentation

## 2023-02-07 DIAGNOSIS — R059 Cough, unspecified: Secondary | ICD-10-CM | POA: Insufficient documentation

## 2023-02-07 DIAGNOSIS — R197 Diarrhea, unspecified: Secondary | ICD-10-CM | POA: Insufficient documentation

## 2023-02-07 LAB — CBC
HCT: 42 % (ref 36.0–46.0)
Hemoglobin: 13.3 g/dL (ref 12.0–15.0)
MCH: 30.6 pg (ref 26.0–34.0)
MCHC: 31.7 g/dL (ref 30.0–36.0)
MCV: 96.8 fL (ref 80.0–100.0)
Platelets: 284 10*3/uL (ref 150–400)
RBC: 4.34 MIL/uL (ref 3.87–5.11)
RDW: 13 % (ref 11.5–15.5)
WBC: 6 10*3/uL (ref 4.0–10.5)
nRBC: 0 % (ref 0.0–0.2)

## 2023-02-07 LAB — URINALYSIS, ROUTINE W REFLEX MICROSCOPIC
Bilirubin Urine: NEGATIVE
Glucose, UA: NEGATIVE mg/dL
Hgb urine dipstick: NEGATIVE
Ketones, ur: NEGATIVE mg/dL
Nitrite: NEGATIVE
Protein, ur: NEGATIVE mg/dL
Specific Gravity, Urine: 1.015 (ref 1.005–1.030)
pH: 5 (ref 5.0–8.0)

## 2023-02-07 LAB — COMPREHENSIVE METABOLIC PANEL
ALT: 25 U/L (ref 0–44)
AST: 24 U/L (ref 15–41)
Albumin: 3.8 g/dL (ref 3.5–5.0)
Alkaline Phosphatase: 83 U/L (ref 38–126)
Anion gap: 8 (ref 5–15)
BUN: 15 mg/dL (ref 6–20)
CO2: 25 mmol/L (ref 22–32)
Calcium: 9 mg/dL (ref 8.9–10.3)
Chloride: 104 mmol/L (ref 98–111)
Creatinine, Ser: 0.69 mg/dL (ref 0.44–1.00)
GFR, Estimated: >60 mL/min (ref >60)
Glucose, Bld: 125 mg/dL — ABNORMAL HIGH (ref 70–99)
Potassium: 3.9 mmol/L (ref 3.5–5.1)
Sodium: 137 mmol/L (ref 135–145)
Total Bilirubin: 0.3 mg/dL (ref 0.0–1.2)
Total Protein: 8.1 g/dL (ref 6.5–8.1)

## 2023-02-07 LAB — LIPASE, BLOOD: Lipase: 51 U/L (ref 11–51)

## 2023-02-07 NOTE — ED Triage Notes (Signed)
 Pt c/o umbilical abdominal pain and mild diarrhea starting today.  Pain score 9/10.

## 2023-02-07 NOTE — ED Provider Notes (Signed)
 [facility] Provider Note   CSN: [id number] Arrival date & time: 02/07/23  1715     History  Chief Complaint  Patient presents with   Abdominal Pain   Diarrhea    Alexandra Floyd is a 58 y.o. female.   Abdominal Pain Associated symptoms: diarrhea   Associated symptoms: no chest pain, no chills, no cough, no fever and no shortness of breath   Diarrhea Associated symptoms: abdominal pain   Associated symptoms: no chills, no fever and no headaches        Alexandra Floyd is a 58 y.o. female with past medical history of hep C, anxiety, depression, polysubstance use who presents to the Emergency Department complaining of mid abdominal pain and diarrhea since earlier today.  Also endorses persistent cough.  Was seen here multiple times last month for cough and treated with antibiotics for pneumonia.  States she completed her antibiotics without improvement.  Continues to have occasionally productive cough.  States her abdominal pain began today after eating.  No vomiting or nausea.  She denies flank pain or dysuria.  Watery brown stools.  No fever.  Home Medications Prior to Admission medications   Medication Sig Start Date End Date Taking? Authorizing Provider  amoxicillin -clavulanate (AUGMENTIN ) 875-125 MG tablet Take 1 tablet by mouth every 12 (twelve) hours. Patient not taking: Reported on 01/16/2023 01/12/23   Anahy Esh, PA-C  azithromycin  (ZITHROMAX ) 250 MG tablet Take 1 tablet (250 mg total) by mouth daily. Take 1 tablet daily for 4 days 01/21/23   Idol, Julie, PA-C  benzonatate  (TESSALON ) 100 MG capsule Take 2 capsules (200 mg total) by mouth 3 (three) times daily as needed. 01/21/23   Idol, Julie, PA-C  chlorhexidine  (HIBICLENS ) 4 % external liquid Apply topically daily as needed. 11/15/22   Stuart Vernell Norris, PA-C  HYDROcodone -acetaminophen  (NORCO/VICODIN) 5-325 MG tablet Take one tab po q 4 hrs prn pain 01/12/23    Giovonni Poirier, PA-C  levothyroxine  (SYNTHROID ) 25 MCG tablet Take 1 tablet (25 mcg total) by mouth daily before breakfast. 08/17/22   Golda Lynwood PARAS, MD  neomycin -polymyxin-pramoxine (NEOSPORIN  PLUS) 1 % cream Apply topically 2 (two) times daily. 12/12/22   Yolande Lamar BROCKS, MD  omeprazole  (PRILOSEC) 20 MG capsule Take 1 capsule (20 mg total) by mouth daily. 08/17/22   Golda Lynwood PARAS, MD      Allergies    Lasix [furosemide]    Review of Systems   Review of Systems  Constitutional:  Negative for appetite change, chills and fever.  Respiratory:  Negative for cough and shortness of breath.   Cardiovascular:  Negative for chest pain.  Gastrointestinal:  Positive for abdominal pain and diarrhea.  Neurological:  Negative for dizziness and headaches.    Physical Exam Updated Vital Signs BP 133/88   Pulse 80   Temp 98.8 F (37.1 C) (Oral)   Resp 20   Ht 5' 3 (1.6 m)   Wt 72.6 kg   SpO2 99%   BMI 28.34 kg/m  Physical Exam Vitals and nursing note reviewed.  Constitutional:      General: She is not in acute distress.    Appearance: Normal appearance. She is well-developed. She is not ill-appearing or toxic-appearing.  HENT:     Mouth/Throat:     Mouth: Mucous membranes are moist.  Cardiovascular:     Rate and Rhythm: Normal rate and regular rhythm.     Pulses: Normal pulses.  Pulmonary:     Effort: Pulmonary  effort is normal.  Abdominal:     General: There is no distension.     Palpations: Abdomen is soft.     Tenderness: There is no abdominal tenderness.  Musculoskeletal:        General: Normal range of motion.  Skin:    Capillary Refill: Capillary refill takes less than 2 seconds.  Neurological:     General: No focal deficit present.     Mental Status: She is alert.     Sensory: No sensory deficit.     Motor: No weakness.     ED Results / Procedures / Treatments   Labs (all labs ordered are listed, but only abnormal results are displayed) Labs Reviewed   COMPREHENSIVE METABOLIC PANEL - Abnormal; Notable for the following components:      Result Value   Glucose, Bld 125 (*)    All other components within normal limits  URINALYSIS, ROUTINE W REFLEX MICROSCOPIC - Abnormal; Notable for the following components:   APPearance HAZY (*)    Leukocytes,Ua LARGE (*)    Bacteria, UA RARE (*)    All other components within normal limits  URINE CULTURE  LIPASE, BLOOD  CBC    EKG None  Radiology No results found.  Procedures Procedures    Medications Ordered in ED Medications - No data to display  ED Course/ Medical Decision Making/ A&P                                 Medical Decision Making Patient with multiple visits to the ER last month.  Recently treated with antibiotics for pneumonia.  Here today with diffuse mid abdominal pain and diarrhea.  No reported fever.  Also states that her cough has not improved.  Denies any chest pain or shortness of breath.  Afebrile here.  On my exam, patient well-appearing nontoxic.  Her abdomen is soft without peritoneal signs.  Mucous membranes are moist.  Differential includes but not limited to acute surgical abdomen, gastroenteritis, C. difficile infection.  No left lower quadrant pain to suggest diverticulitis, no right lower quadrant pain suggestive of appendicitis.  Amount and/or Complexity of Data Reviewed Labs: ordered.    Details: Labs interpreted by me, no evidence of leukocytosis, chemistries are unremarkable, lipase reassuring.  Urinalysis shows large leukocytes hazy urine with 11-20 red cells and white cells rare bacteria and 11-20 squamous cells, suspected contaminant. Urine culture pending Radiology: ordered.    Details: CT abdomen pelvis was ordered but patient eloped from department before imaging Discussion of management or test interpretation with external provider(s): 2035 I was notified by nursing that patient left the department without notifying staff.  Left prior to  completion of her workup           Final Clinical Impression(s) / ED Diagnoses Final diagnoses:  Abdominal pain, unspecified abdominal location    Rx / DC Orders ED Discharge Orders     None         [name] 02/07/23 2039    Yolande Lamar BROCKS, MD 02/14/23 1148

## 2023-02-10 ENCOUNTER — Encounter (HOSPITAL_COMMUNITY): Payer: Self-pay | Admitting: Emergency Medicine

## 2023-02-10 ENCOUNTER — Emergency Department (HOSPITAL_COMMUNITY): Payer: Commercial Managed Care - HMO

## 2023-02-10 ENCOUNTER — Emergency Department (HOSPITAL_COMMUNITY)
Admission: EM | Admit: 2023-02-10 | Discharge: 2023-02-10 | Disposition: A | Discharge status: Home / Self Care | Payer: Commercial Managed Care - HMO | Attending: Emergency Medicine | Admitting: Emergency Medicine

## 2023-02-10 ENCOUNTER — Other Ambulatory Visit: Payer: Self-pay

## 2023-02-10 DIAGNOSIS — R220 Localized swelling, mass and lump, head: Secondary | ICD-10-CM | POA: Diagnosis present

## 2023-02-10 DIAGNOSIS — L03211 Cellulitis of face: Secondary | ICD-10-CM | POA: Diagnosis not present

## 2023-02-10 DIAGNOSIS — K047 Periapical abscess without sinus: Secondary | ICD-10-CM

## 2023-02-10 LAB — CBC WITH DIFFERENTIAL/PLATELET
Abs Immature Granulocytes: 0.01 10*3/uL (ref 0.00–0.07)
Basophils Absolute: 0.1 10*3/uL (ref 0.0–0.1)
Basophils Relative: 1 %
Eosinophils Absolute: 0.2 10*3/uL (ref 0.0–0.5)
Eosinophils Relative: 3 %
HCT: 41.3 % (ref 36.0–46.0)
Hemoglobin: 13.4 g/dL (ref 12.0–15.0)
Immature Granulocytes: 0 %
Lymphocytes Relative: 21 %
Lymphs Abs: 1.5 10*3/uL (ref 0.7–4.0)
MCH: 31.5 pg (ref 26.0–34.0)
MCHC: 32.4 g/dL (ref 30.0–36.0)
MCV: 97.2 fL (ref 80.0–100.0)
Monocytes Absolute: 0.8 10*3/uL (ref 0.1–1.0)
Monocytes Relative: 11 %
Neutro Abs: 4.6 10*3/uL (ref 1.7–7.7)
Neutrophils Relative %: 64 %
Platelets: 259 10*3/uL (ref 150–400)
RBC: 4.25 MIL/uL (ref 3.87–5.11)
RDW: 13.4 % (ref 11.5–15.5)
WBC: 7.2 10*3/uL (ref 4.0–10.5)
nRBC: 0 % (ref 0.0–0.2)

## 2023-02-10 LAB — BASIC METABOLIC PANEL
Anion gap: 8 (ref 5–15)
BUN: 11 mg/dL (ref 6–20)
CO2: 24 mmol/L (ref 22–32)
Calcium: 8.9 mg/dL (ref 8.9–10.3)
Chloride: 103 mmol/L (ref 98–111)
Creatinine, Ser: 0.7 mg/dL (ref 0.44–1.00)
GFR, Estimated: >60 mL/min (ref >60)
Glucose, Bld: 98 mg/dL (ref 70–99)
Potassium: 4.2 mmol/L (ref 3.5–5.1)
Sodium: 135 mmol/L (ref 135–145)

## 2023-02-10 MED ORDER — IBUPROFEN 800 MG PO TABS: 800.0000 mg | ORAL_TABLET | Freq: Three times a day (TID) | ORAL | 0 refills | Status: DC | PRN

## 2023-02-10 MED ORDER — IOHEXOL 300 MG/ML  SOLN
75.0000 mL | Freq: Once | INTRAMUSCULAR | Status: AC | PRN
  Administered 2023-02-10: 75 mL via INTRAVENOUS

## 2023-02-10 MED ORDER — MORPHINE SULFATE (PF) 4 MG/ML IV SOLN
4.0000 mg | Freq: Once | INTRAVENOUS | Status: AC
Start: 2023-02-10 — End: 2023-02-10
  Administered 2023-02-10: 4 mg via INTRAVENOUS
  Filled 2023-02-10: qty 1

## 2023-02-10 MED ORDER — CLINDAMYCIN HCL 300 MG PO CAPS: 300.0000 mg | ORAL_CAPSULE | Freq: Four times a day (QID) | ORAL | 0 refills | Status: DC

## 2023-02-10 MED ORDER — SODIUM CHLORIDE 0.9 % IV SOLN
2.0000 g | Freq: Once | INTRAVENOUS | Status: AC
  Administered 2023-02-10: 2 g via INTRAVENOUS
  Filled 2023-02-10: qty 20

## 2023-02-10 NOTE — ED Provider Notes (Signed)
 [facility] Provider Note   CSN: [id number] Arrival date & time: 02/10/23  0535     History  Chief Complaint  Patient presents with   Facial Swelling    Alexandra Floyd is a 58 y.o. female.  The history is provided by the patient.  She has history of GERD, hyperlipidemia, substance abuse and comes in complaining of facial pain and swelling on the right side.  Last night, she popped what she thought was a boil on the right upper gum.  She now feels that her the entire right side of her face is swollen.  She denies fever or chills.  She is complaining of pain.   Home Medications Prior to Admission medications   Medication Sig Start Date End Date Taking? Authorizing Provider  amoxicillin -clavulanate (AUGMENTIN ) 875-125 MG tablet Take 1 tablet by mouth every 12 (twelve) hours. Patient not taking: Reported on 01/16/2023 01/12/23   Triplett, Tammy, PA-C  azithromycin  (ZITHROMAX ) 250 MG tablet Take 1 tablet (250 mg total) by mouth daily. Take 1 tablet daily for 4 days 01/21/23   Idol, Julie, PA-C  benzonatate  (TESSALON ) 100 MG capsule Take 2 capsules (200 mg total) by mouth 3 (three) times daily as needed. 01/21/23   Idol, Julie, PA-C  chlorhexidine  (HIBICLENS ) 4 % external liquid Apply topically daily as needed. 11/15/22   Stuart Vernell Norris, PA-C  HYDROcodone -acetaminophen  (NORCO/VICODIN) 5-325 MG tablet Take one tab po q 4 hrs prn pain 01/12/23   Triplett, Tammy, PA-C  levothyroxine  (SYNTHROID ) 25 MCG tablet Take 1 tablet (25 mcg total) by mouth daily before breakfast. 08/17/22   Golda Lynwood PARAS, MD  neomycin -polymyxin-pramoxine (NEOSPORIN  PLUS) 1 % cream Apply topically 2 (two) times daily. 12/12/22   Yolande Lamar BROCKS, MD  omeprazole  (PRILOSEC) 20 MG capsule Take 1 capsule (20 mg total) by mouth daily. 08/17/22   Golda Lynwood PARAS, MD      Allergies    Lasix [furosemide]    Review of Systems   Review of Systems  All other systems  reviewed and are negative.   Physical Exam Updated Vital Signs BP 136/80   Pulse 76   Temp 97.9 F (36.6 C) (Oral)   Resp 17   Ht 5' 3 (1.6 m)   Wt 73 kg   SpO2 98%   BMI 28.51 kg/m  Physical Exam Vitals and nursing note reviewed.   58 year old female, resting comfortably and in no acute distress. Vital signs are normal. Oxygen saturation is 98%, which is normal. Head is normocephalic and atraumatic. PERRLA, EOMI. She is status post extraction of teeth #1-5.  The gingiva where those teeth would have been is somewhat swollen and tender without fluctuance and no drainage.  There is induration and tenderness around the right nare a extending to the upper lip and slightly towards the malar area.  There is no fluctuance.  There is no palpable preauricular lymph node.. Neck is nontender and supple without adenopathy. Lungs are clear without rales, wheezes, or rhonchi. Chest is nontender. Heart has regular rate and rhythm without murmur. Abdomen is soft, flat, nontender. Extremities have no cyanosis or edema, full range of motion is present. Skin is warm and dry without rash. Neurologic: Mental status is normal, moves all extremities equally.  ED Results / Procedures / Treatments   Labs (all labs ordered are listed, but only abnormal results are displayed) Labs Reviewed  BASIC METABOLIC PANEL  CBC WITH DIFFERENTIAL/PLATELET    EKG None  Radiology No results found.  Procedures Procedures    Medications Ordered in ED Medications  morphine  (PF) 4 MG/ML injection 4 mg (has no administration in time range)  cefTRIAXone  (ROCEPHIN ) 2 g in sodium chloride  0.9 % 100 mL IVPB (has no administration in time range)    ED Course/ Medical Decision Making/ A&P                                 Medical Decision Making Amount and/or Complexity of Data Reviewed Labs: ordered. Radiology: ordered.  Risk Prescription drug management.   Facial cellulitis likely originating from  dental origin but no obvious abscess on my exam.  I have ordered CBC and basic metabolic panel and CT of maxillofacial area with contrast to look for evidence of abscess.  I have ordered a dose of ceftriaxone .  I have reviewed her past records, and she had ED visits on 01/14/2022 and 9//2023 for facial cellulitis.  Case is signed out to Dr. Zammit.  Final Clinical Impression(s) / ED Diagnoses Final diagnoses:  Facial cellulitis    Rx / DC Orders ED Discharge Orders     None         Raford Lenis, MD 02/10/23 0710

## 2023-02-10 NOTE — ED Triage Notes (Signed)
 Pt states she felt like she had an abscess to r side of face yesterday, states she was mashing on it. Pt reports waking up this morning with R sided facial swelling and c/o difficulty breathing.

## 2023-02-10 NOTE — ED Provider Notes (Signed)
 Patient with a dental abscess.  She is sent home on clindamycin and Motrin and referred to follow-up with a dentist   Bethann Berkshire, MD 02/10/23 [phone]

## 2023-02-10 NOTE — ED Notes (Signed)
 Pt is able to drink fluids with EDP approval.

## 2023-02-10 NOTE — Discharge Instructions (Signed)
 Follow-up with Dr. Mia Creek or another dentist next week.  If you get worse over the weekend you should go to either [place] or [place] because we have no oral surgeons in [place]

## 2023-02-11 LAB — URINE CULTURE: Culture: 80000 — AB

## 2023-02-12 ENCOUNTER — Telehealth (HOSPITAL_BASED_OUTPATIENT_CLINIC_OR_DEPARTMENT_OTHER): Payer: Self-pay | Admitting: *Deleted

## 2023-02-12 NOTE — Telephone Encounter (Signed)
 Post ED Visit - Positive Culture Follow-up  Culture report reviewed by antimicrobial stewardship pharmacist: [facility] []  Rankin Dee, Pharm.D. []  Venetia Gully, Pharm.D., BCPS AQ-ID []  Garrel Crews, Pharm.D., BCPS []  Almarie Lunger, Pharm.D., BCPS []  [place], [place].D., BCPS, AAHIVP []  Rosaline Bihari, Pharm.D., BCPS, AAHIVP []  Vernell Meier, PharmD, BCPS []  Latanya Hint, PharmD, BCPS []  Donald Medley, PharmD, BCPS []  Rocky Bold, PharmD []  Dorothyann Alert, PharmD, BCPS [x]  Dorn Poot, PharmD  [facility] []  Rosaline Edison, PharmD []  Romona Bliss, PharmD []  Dolphus Roller, PharmD []  Veva Seip, Rph []  Vernell Daunt) Leonce, PharmD []  Eva Allis, PharmD []  Rosaline Millet, PharmD []  Iantha Batch, PharmD []  Arvin Gauss, PharmD []  Wanda Hasting, PharmD []  Ronal Rav, PharmD []  Rocky Slade, PharmD []  Bard Jeans, PharmD   Positive urine culture Was seen again for dental pain 1/10 and given Clindamycin  Rx and no further treatment needed  Albino Alan Novak 02/12/2023, 10:58 AM

## 2023-02-14 ENCOUNTER — Ambulatory Visit
Admission: EM | Admit: 2023-02-14 | Discharge: 2023-02-14 | Disposition: A | Discharge status: Home / Self Care | Payer: Commercial Managed Care - HMO | Attending: Nurse Practitioner | Admitting: Nurse Practitioner

## 2023-02-14 DIAGNOSIS — R052 Subacute cough: Secondary | ICD-10-CM | POA: Diagnosis not present

## 2023-02-14 MED ORDER — BENZONATATE 100 MG PO CAPS: 200.0000 mg | ORAL_CAPSULE | Freq: Three times a day (TID) | ORAL | 0 refills | Status: DC | PRN

## 2023-02-14 NOTE — Discharge Instructions (Addendum)
 You can take the Tessalon  Perles as needed for the cough.   Please follow-up with your primary care to get a repeat CT of your chest to ensure the pneumonia is fully resolved.  We do not order CTs in urgent care.  Additionally, your lung sounds clear today.  Follow-up with your primary care provider regarding your other medicine refills.

## 2023-02-14 NOTE — ED Notes (Signed)
 Patient was stuck by CMA and RN and both unable to obtain blood.

## 2023-02-14 NOTE — ED Triage Notes (Signed)
 Pt reports she wants to check to see if she still has pneumonia because her chest is still tight and she is coughing "since last month".

## 2023-02-14 NOTE — ED Provider Notes (Addendum)
 [facility]    CSN: [id number] Arrival date & time: 02/14/23  1223      History   Chief Complaint No chief complaint on file.   HPI Alexandra Floyd is a 58 y.o. female.   Patient presents today with approximately 1 month history of cough.  Reports she was diagnosed with pneumonia on 01/21/2023 and she wants to ensure that is fully resolved today.  She reports she is still coughing, however it is a dry cough.  She endorses shortness of breath at times, however is a current smoker.  Also endorses occasional chest tightness, runny and stuffy nose, and fatigue.  She reports her appetite is slowly coming back and denies fever, body aches or chills, sore throat, headache, abdominal pain, nausea/vomiting, and diarrhea.  She was treated for pneumonia with azithromycin  and she took the entire thing.  She has not used her albuterol  inhaler recently.  She is still smoking.  Patient is requesting HIV testing today as she does not know why she is getting sick.  Patient also requests multiple refills including pantoprazole  and Synthroid , has been out of these medicines for months.  She has follow-up scheduled with PCP in approximately 1 month.    Past Medical History:  Diagnosis Date   Anxiety    Cellulitis    Depression    Heart murmur    Hep C w/ coma, chronic    MRSA cellulitis     Patient Active Problem List   Diagnosis Date Noted   Encounter for hepatitis C virus screening test for high risk patient 08/17/2022   Pure hypercholesterolemia 08/17/2022   Hypothyroidism (acquired) 08/17/2022   Screening for colon cancer 08/17/2022   GERD (gastroesophageal reflux disease) 08/17/2022   Substance abuse (HCC) 06/10/2021   TSH elevation 06/10/2021   Substance induced mood disorder (HCC) 09/21/2019   Polysubstance abuse (HCC) 09/21/2019   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) 09/21/2019   Cocaine use disorder, mild, abuse (HCC) 09/21/2019   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      Home Medications    Prior to Admission medications   Medication Sig Start Date End Date Taking? Authorizing Provider  benzonatate  (TESSALON ) 100 MG capsule Take 2 capsules (200 mg total) by mouth 3 (three) times daily as needed. 02/14/23   Chandra Harlene LABOR, NP  chlorhexidine  (HIBICLENS ) 4 % external liquid Apply topically daily as needed. 11/15/22   Stuart Vernell Norris, PA-C  HYDROcodone -acetaminophen  (NORCO/VICODIN) 5-325 MG tablet Take one tab po q 4 hrs prn pain 01/12/23   Triplett, Tammy, PA-C  ibuprofen  (ADVIL ) 800 MG tablet Take 1 tablet (800 mg total) by mouth every 8 (eight) hours as needed. 02/10/23   Zammit, Joseph, MD  levothyroxine  (SYNTHROID ) 25 MCG tablet Take 1 tablet (25 mcg total) by mouth daily before breakfast. 08/17/22   Golda Lynwood PARAS, MD  neomycin -polymyxin-pramoxine (NEOSPORIN  PLUS) 1 % cream Apply topically 2 (two) times daily. 12/12/22   Yolande Lamar BROCKS, MD  omeprazole  (PRILOSEC) 20 MG capsule Take 1 capsule (20 mg total) by mouth daily. 08/17/22   Golda Lynwood PARAS, MD    Family History  Family History  Problem Relation Age of Onset   Hypertension Mother     Social History Social History   Tobacco Use   Smoking status: Every Day    Current packs/day: 1.00    Average packs/day: 1 pack/day for 47.0 years (47.0 ttl pk-yrs)    Types: Cigarettes    Start date: 61   Smokeless tobacco: Never  Vaping Use   Vaping status: Some Days  Substance Use Topics   Alcohol use: Not Currently    Comment: occasionally   Drug use: Not Currently    Types: Methamphetamines, Crack cocaine, Heroin     Comment: occasionally     Allergies   Lasix [furosemide]   Review of Systems Review of Systems Per HPI  Physical Exam Triage Vital Signs ED Triage Vitals  Encounter Vitals Group     BP 02/14/23 1251 (!) 148/93     Systolic BP Percentile --      Diastolic BP Percentile --      Pulse Rate 02/14/23 1251 72     Resp 02/14/23 1251 16     Temp 02/14/23 1251 97.8 F (36.6 C)     Temp Source 02/14/23 1251 Oral     SpO2 02/14/23 1251 98 %     Weight --      Height --      Head Circumference --      Peak Flow --      Pain Score 02/14/23 1252 0     Pain Loc --      Pain Education --      Exclude from Growth Chart --    No data found.  Updated Vital Signs BP (!) 148/93 (BP Location: Right Arm)   Pulse 72   Temp 97.8 F (36.6 C) (Oral)   Resp 16   SpO2 98%   Visual Acuity Right Eye Distance:   Left Eye Distance:   Bilateral Distance:    Right Eye Near:   Left Eye Near:    Bilateral Near:     Physical Exam Vitals and nursing note reviewed.  Constitutional:      General: She is not in acute distress.    Appearance: Normal appearance. She is not ill-appearing or toxic-appearing.  HENT:     Head: Normocephalic and atraumatic.     Right Ear: Ear canal and external ear normal. There is impacted cerumen.     Left Ear: Ear canal and external ear normal. There is impacted cerumen.     Nose: Congestion present. No rhinorrhea.     Mouth/Throat:     Mouth: Mucous membranes are moist.     Pharynx: Oropharynx is clear. No oropharyngeal exudate or posterior oropharyngeal erythema.  Eyes:     General: No scleral icterus.    Extraocular Movements: Extraocular movements intact.  Cardiovascular:     Rate and Rhythm: Normal rate and regular rhythm.  Pulmonary:     Effort: Pulmonary effort is normal. No respiratory distress.     Breath sounds: Normal breath sounds. No wheezing, rhonchi or rales.  Musculoskeletal:     Cervical back: Normal range of motion and neck supple.   Lymphadenopathy:     Cervical: No cervical adenopathy.  Skin:    General: Skin is warm and dry.     Coloration: Skin is not jaundiced or pale.     Findings: No erythema or rash.  Neurological:     Mental Status: She is alert and oriented to person, place, and time.  Psychiatric:        Speech: Speech is rapid and pressured and tangential.        Behavior: Behavior is cooperative.      UC Treatments / Results  Labs (all labs ordered are listed, but only abnormal results are displayed) Labs Reviewed  HIV ANTIBODY (ROUTINE TESTING W REFLEX)    EKG   Radiology No results found.  Procedures Procedures (including critical care time)  Medications Ordered in UC Medications - No data to display  Initial Impression / Assessment and Plan / UC Course  I have reviewed the triage vital signs and the nursing notes.  Pertinent labs & imaging results that were available during my care of the patient were reviewed by me and considered in my medical decision making (see chart for details).   Patient is well-appearing, normotensive, afebrile, not tachycardic, not tachypneic, oxygenating well on room air.    1. Subacute cough Overall, vitals and exam are reassuring Per chart review, she was diagnosed with pneumonia by CT chest and it was recommended to have repeat CT chest in 4 to 6 weeks; this falls 02/18/2023 through 03/04/2023 I recommended patient reach out to her primary care provider to coordinate this In the meantime, start cough suppressant medication, albuterol  inhaler and strict ER precautions discussed with patient Will perform HIV testing per patient's request Recommended follow-up with PCP as planned regarding pantoprazole  and levothyroxine   Update: Clinic staff was unable to obtain blood work for HIV testing.  HIV test canceled.  Patient verbalized understanding.  The patient was given the opportunity to ask questions.  All questions answered to their satisfaction.  The  patient is in agreement to this plan.    Final Clinical Impressions(s) / UC Diagnoses   Final diagnoses:  Subacute cough     Discharge Instructions      You can take the Tessalon  Perles as needed for the cough.   Please follow-up with your primary care to get a repeat CT of your chest to ensure the pneumonia is fully resolved.  We do not order CTs in urgent care.  Additionally, your lung sounds clear today.  We will contact you if the HIV test is abnormal.  Follow-up with your primary care provider regarding your other medicine refills.     ED Prescriptions     Medication Sig Dispense Auth. Provider   benzonatate  (TESSALON ) 100 MG capsule Take 2 capsules (200 mg total) by mouth 3 (three) times daily as needed. 30 capsule Chandra Harlene LABOR, NP      PDMP not reviewed this encounter.   Chandra Harlene LABOR, NP 02/14/23 1336    Chandra Harlene LABOR, NP 02/14/23 1344

## 2023-02-24 ENCOUNTER — Ambulatory Visit
Admission: EM | Admit: 2023-02-24 | Discharge: 2023-02-24 | Disposition: A | Discharge status: Home / Self Care | Payer: Commercial Managed Care - HMO | Attending: Family Medicine | Admitting: Family Medicine

## 2023-02-24 ENCOUNTER — Ambulatory Visit: Payer: Commercial Managed Care - HMO | Admitting: Family Medicine

## 2023-02-24 ENCOUNTER — Encounter: Payer: Self-pay | Admitting: Emergency Medicine

## 2023-02-24 DIAGNOSIS — R8290 Unspecified abnormal findings in urine: Secondary | ICD-10-CM | POA: Diagnosis not present

## 2023-02-24 DIAGNOSIS — N390 Urinary tract infection, site not specified: Secondary | ICD-10-CM | POA: Insufficient documentation

## 2023-02-24 DIAGNOSIS — K047 Periapical abscess without sinus: Secondary | ICD-10-CM | POA: Insufficient documentation

## 2023-02-24 LAB — POCT URINALYSIS DIP (MANUAL ENTRY)
Bilirubin, UA: NEGATIVE
Glucose, UA: NEGATIVE mg/dL
Ketones, POC UA: NEGATIVE mg/dL
Nitrite, UA: NEGATIVE
Protein Ur, POC: NEGATIVE mg/dL
Spec Grav, UA: 1.02 (ref 1.010–1.025)
Urobilinogen, UA: 0.2 U/dL
pH, UA: 5.5 (ref 5.0–8.0)

## 2023-02-24 MED ORDER — AMOXICILLIN-POT CLAVULANATE 875-125 MG PO TABS: 1.0000 | ORAL_TABLET | Freq: Two times a day (BID) | ORAL | 0 refills | Status: DC

## 2023-02-24 MED ORDER — LIDOCAINE VISCOUS HCL 2 % MT SOLN: 10.0000 mL | OROMUCOSAL | 0 refills | Status: DC | PRN

## 2023-02-24 MED ORDER — CHLORHEXIDINE GLUCONATE 0.12 % MT SOLN: 15.0000 mL | Freq: Two times a day (BID) | OROMUCOSAL | 0 refills | Status: DC

## 2023-02-24 NOTE — ED Triage Notes (Signed)
Right upper dental pain with facial swelling that started this morning.

## 2023-02-25 NOTE — ED Provider Notes (Signed)
[facility]    CSN: [id number] Arrival date & time: 02/24/23  1648      History   Chief Complaint No chief complaint on file.   HPI Alexandra Floyd is a 58 y.o. female.   Patient presenting today with 1 day history of right upper dental pain, swelling.  States that she has known dental issues in this area and has had multiple infections to this area.  Denies fever, chills, difficulty breathing or swallowing, bleeding or drainage.  So far not trying anything other than over-the-counter pain relievers which she states are not helping.  She is also having ongoing dysuria, urinary frequency and urgency and wanting to have her urine tested.  Denies vaginal discharge or other symptoms, concern for STIs.  Patient is postmenopausal.    Past Medical History:  Diagnosis Date   Anxiety    Cellulitis    Depression    Heart murmur    Hep C w/ coma, chronic    MRSA cellulitis     Patient Active Problem List   Diagnosis Date Noted   Encounter for hepatitis C virus screening test for high risk patient 08/17/2022   Pure hypercholesterolemia 08/17/2022   Hypothyroidism (acquired) 08/17/2022   Screening for colon cancer 08/17/2022   GERD (gastroesophageal reflux disease) 08/17/2022   Substance abuse (HCC) 06/10/2021   TSH elevation 06/10/2021   Substance induced mood disorder (HCC) 09/21/2019   Polysubstance abuse (HCC) 09/21/2019   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      Home Medications    Prior to  Admission medications   Medication Sig Start Date End Date Taking? Authorizing Provider  amoxicillin-clavulanate (AUGMENTIN) 875-125 MG tablet Take 1 tablet by mouth every 12 (twelve) hours. 02/24/23  Yes Particia Nearing, PA-C  chlorhexidine (PERIDEX) 0.12 % solution Use as directed 15 mLs in the mouth or throat 2 (two) times daily. 02/24/23  Yes Particia Nearing, PA-C  lidocaine (XYLOCAINE) 2 % solution Use as directed 10 mLs in the mouth or throat every 3 (three) hours as needed. 02/24/23  Yes Particia Nearing, PA-C    Family History Family History  Problem Relation Age of Onset   Hypertension Mother     Social History Social History   Tobacco Use   Smoking status: Every Day    Current packs/day: 1.00    Average packs/day: 1 pack/day for 47.1 years (47.1 ttl pk-yrs)    Types: Cigarettes    Start date: 1978   Smokeless tobacco: Never  Vaping Use   Vaping status: Some Days  Substance Use Topics   Alcohol use: Not Currently    Comment: occasionally   Drug use: Not Currently    Types: Methamphetamines, "Crack" cocaine, Heroin    Comment: occasionally     Allergies   Lasix [furosemide]   Review of Systems Review of Systems Per HPI  Physical Exam Triage Vital Signs ED Triage Vitals  Encounter Vitals Group     BP 02/24/23 1734 138/74     Systolic BP  Percentile --      Diastolic BP Percentile --      Pulse Rate 02/24/23 1734 92     Resp 02/24/23 1734 18     Temp 02/24/23 1734 98.9 F (37.2 C)     Temp Source 02/24/23 1734 Oral     SpO2 02/24/23 1734 98 %     Weight --      Height --      Head Circumference --      Peak Flow --      Pain Score 02/24/23 1735 9     Pain Loc --      Pain Education --      Exclude from Growth Chart --    No data found.  Updated Vital Signs BP 138/74 (BP Location: Right Arm)   Pulse 92   Temp 98.9 F (37.2 C) (Oral)   Resp 18   SpO2 98%   Visual Acuity Right Eye Distance:   Left Eye Distance:    Bilateral Distance:    Right Eye Near:   Left Eye Near:    Bilateral Near:     Physical Exam Vitals and nursing note reviewed.  Constitutional:      Appearance: Normal appearance. She is not ill-appearing.  HENT:     Head: Atraumatic.     Mouth/Throat:     Mouth: Mucous membranes are moist.     Comments: Gingival erythema and edema to the right upper jaw Eyes:     Extraocular Movements: Extraocular movements intact.     Conjunctiva/sclera: Conjunctivae normal.  Cardiovascular:     Rate and Rhythm: Normal rate and regular rhythm.     Heart sounds: Normal heart sounds.  Pulmonary:     Effort: Pulmonary effort is normal.     Breath sounds: Normal breath sounds.  Abdominal:     General: Bowel sounds are normal. There is no distension.     Palpations: Abdomen is soft.     Tenderness: There is no abdominal tenderness. There is no right CVA tenderness, left CVA tenderness or guarding.  Musculoskeletal:        General: Normal range of motion.     Cervical back: Normal range of motion and neck supple.  Skin:    General: Skin is warm and dry.  Neurological:     Mental Status: She is alert and oriented to person, place, and time.  Psychiatric:        Mood and Affect: Mood normal.        Thought Content: Thought content normal.        Judgment: Judgment normal.      UC Treatments / Results  Labs (all labs ordered are listed, but only abnormal results are displayed) Labs Reviewed  POCT URINALYSIS DIP (MANUAL ENTRY) - Abnormal; Notable for the following components:      Result Value   Blood, UA small (*)    Leukocytes, UA Large (3+) (*)    All other components within normal limits  URINE CULTURE    EKG   Radiology No results found.  Procedures Procedures (including critical care time)  Medications Ordered in UC Medications - No data to display  Initial Impression / Assessment and Plan / UC Course  I have reviewed the triage vital signs and the nursing  notes.  Pertinent labs & imaging results that were available during my care of the patient were reviewed by me and considered in my medical decision making (see chart for details).  Will treat for dental infection with Augmentin, Peridex, viscous lidocaine and close dental follow-up.  Possible urinary tract infection on urinalysis today, urine culture pending.  Discussed that the Augmentin may cover for this but we would let her know if we need to make any changes based on the urine culture.  Push fluids, Azo as needed.  Final Clinical Impressions(s) / UC Diagnoses   Final diagnoses:  Dental infection  Abnormal urinalysis  Acute lower UTI   Discharge Instructions   None    ED Prescriptions     Medication Sig Dispense Auth. Provider   amoxicillin-clavulanate (AUGMENTIN) 875-125 MG tablet Take 1 tablet by mouth every 12 (twelve) hours. 14 tablet Particia Nearing, [place]   lidocaine (XYLOCAINE) 2 % solution Use as directed 10 mLs in the mouth or throat every 3 (three) hours as needed. 100 mL Particia Nearing, PA-C   chlorhexidine (PERIDEX) 0.12 % solution Use as directed 15 mLs in the mouth or throat 2 (two) times daily. 120 mL Particia Nearing, [place]      PDMP not reviewed this encounter.   Particia Nearing, [place] 02/25/23 1420

## 2023-02-26 ENCOUNTER — Emergency Department (HOSPITAL_COMMUNITY)
Admission: EM | Admit: 2023-02-26 | Discharge: 2023-02-26 | Disposition: A | Discharge status: Home / Self Care | Payer: Commercial Managed Care - HMO

## 2023-02-26 ENCOUNTER — Other Ambulatory Visit: Payer: Self-pay

## 2023-02-26 ENCOUNTER — Encounter (HOSPITAL_COMMUNITY): Payer: Self-pay

## 2023-02-26 DIAGNOSIS — K047 Periapical abscess without sinus: Secondary | ICD-10-CM | POA: Diagnosis not present

## 2023-02-26 DIAGNOSIS — K0889 Other specified disorders of teeth and supporting structures: Secondary | ICD-10-CM | POA: Diagnosis present

## 2023-02-26 LAB — URINALYSIS, ROUTINE W REFLEX MICROSCOPIC
Bilirubin Urine: NEGATIVE
Glucose, UA: NEGATIVE mg/dL
Ketones, ur: NEGATIVE mg/dL
Nitrite: NEGATIVE
Protein, ur: NEGATIVE mg/dL
Specific Gravity, Urine: 1.008 (ref 1.005–1.030)
pH: 5 (ref 5.0–8.0)

## 2023-02-26 LAB — URINE CULTURE: Culture: <10000 — AB

## 2023-02-26 MED ORDER — HYDROCODONE-ACETAMINOPHEN 5-325 MG PO TABS
1.0000 | ORAL_TABLET | Freq: Once | ORAL | Status: AC
  Administered 2023-02-26: 1 via ORAL
  Filled 2023-02-26: qty 1

## 2023-02-26 MED ORDER — LIDOCAINE VISCOUS HCL 2 % MT SOLN
15.0000 mL | Freq: Once | OROMUCOSAL | Status: AC
  Administered 2023-02-26: 15 mL via OROMUCOSAL
  Filled 2023-02-26: qty 15

## 2023-02-26 NOTE — ED Triage Notes (Signed)
Pt reports she is currently taking Augmentin from UC x 2 days for an abscessed tooth on the right upper side but feels it is getting worse.

## 2023-02-26 NOTE — ED Provider Notes (Signed)
[place] EMERGENCY DEPARTMENT AT [facility] Provider Note   CSN: [id number] Arrival date & time: 02/26/23  1329     History  Chief Complaint  Patient presents with   Dental Pain    Alexandra Floyd is a 58 y.o. female.  She has PMH of mood disorder, substance abuse, GERD.  Presents to the ER for evaluation of right upper dental pain ongoing for several days, has had this in the past.  States she went to urgent care 2 days ago and was prescribed Augmentin and states she is having increased swelling and pain.  No fevers or chills, no trouble swallowing or breathing.  She has been having dysuria, per urgent care notes urine was sent for culture and patient is advised on Azo and push fluids.  Patient states she still having the dysuria but denies flank pain or abdominal pain, no nausea or vomiting, no other symptoms   Dental Pain      Home Medications Prior to Admission medications   Medication Sig Start Date End Date Taking? Authorizing Provider  amoxicillin-clavulanate (AUGMENTIN) 875-125 MG tablet Take 1 tablet by mouth every 12 (twelve) hours. 02/24/23   Particia Nearing, PA-C  chlorhexidine (PERIDEX) 0.12 % solution Use as directed 15 mLs in the mouth or throat 2 (two) times daily. 02/24/23   Particia Nearing, PA-C  lidocaine (XYLOCAINE) 2 % solution Use as directed 10 mLs in the mouth or throat every 3 (three) hours as needed. 02/24/23   Particia Nearing, PA-C      Allergies    Lasix [furosemide]    Review of Systems   Review of Systems  Physical Exam Updated Vital Signs BP 124/80   Pulse 63   Temp 98.6 F (37 C) (Temporal)   Resp 16   Ht 5\' 3"  (1.6 m)   Wt 74.7 kg   SpO2 100%   BMI 29.18 kg/m  Physical Exam Vitals and nursing note reviewed.  Constitutional:      General: She is not in acute distress.    Appearance: She is well-developed.  HENT:     Head: Normocephalic and atraumatic.     Mouth/Throat:     Mouth: Mucous membranes  are moist.     Comments: Patient in full clear sentences, handles oral secretions well, she has overall very poor dentition, fluctuant abscess noted to right upper gingivae Eyes:     Extraocular Movements: Extraocular movements intact.     Conjunctiva/sclera: Conjunctivae normal.     Pupils: Pupils are equal, round, and reactive to light.  Cardiovascular:     Rate and Rhythm: Normal rate and regular rhythm.     Heart sounds: No murmur heard. Pulmonary:     Effort: Pulmonary effort is normal. No respiratory distress.     Breath sounds: Normal breath sounds.  Abdominal:     Palpations: Abdomen is soft.     Tenderness: There is no abdominal tenderness. There is no right CVA tenderness or left CVA tenderness.  Musculoskeletal:        General: No swelling.     Cervical back: Neck supple.  Skin:    General: Skin is warm and dry.     Capillary Refill: Capillary refill takes less than 2 seconds.  Neurological:     General: No focal deficit present.     Mental Status: She is alert and oriented to person, place, and time.  Psychiatric:        Mood and Affect: Mood normal.  Behavior: Behavior normal.     ED Results / Procedures / Treatments   Labs (all labs ordered are listed, but only abnormal results are displayed) Labs Reviewed  URINALYSIS, ROUTINE W REFLEX MICROSCOPIC - Abnormal; Notable for the following components:      Result Value   Color, Urine STRAW (*)    Hgb urine dipstick SMALL (*)    Leukocytes,Ua SMALL (*)    Bacteria, UA RARE (*)    All other components within normal limits    EKG None  Radiology No results found.  Procedures .Incision and Drainage  Date/Time: 02/26/2023 4:16 PM  Performed by: Ma Rings, PA-C Authorized by: Ma Rings, PA-C   Consent:    Consent obtained:  Verbal   Consent given by:  Patient   Risks discussed:  Bleeding, incomplete drainage and pain Universal protocol:    Patient identity confirmed:  Verbally with  patient Location:    Type:  Abscess   Location:  Mouth   Mouth location: gingiva. Anesthesia:    Anesthesia method:  Topical application   Topical anesthetic:  Lidocaine gel Procedure type:    Complexity:  Simple Procedure details:    Incision types:  Single straight   Incision depth:  Submucosal   Drainage:  Purulent   Drainage amount:  Moderate   Wound treatment:  Wound left open   Packing materials:  None Post-procedure details:    Procedure completion:  Tolerated well, no immediate complications     Medications Ordered in ED Medications  HYDROcodone-acetaminophen (NORCO/VICODIN) 5-325 MG per tablet 1 tablet (1 tablet Oral Given 02/26/23 1522)  lidocaine (XYLOCAINE) 2 % viscous mouth solution 15 mL (15 mLs Mouth/Throat Given 02/26/23 1522)    ED Course/ Medical Decision Making/ A&P                                 Medical Decision Making Ddx: dental abscess, dental caries, dental fracture, alveolar osteitis, ludwigs angina, other ED course: Patient present with pain in the upper gingiva. They speak in full and clear sentences,  handle their oral secretions well. There is no sign of deep space abscess. There is a drainable collection noted which is incised and drained.  This with patient source control should help improve her symptoms, continue Augmentin Pain management: Pt given prescription lidocaine and Norco for pain control, concern proved Advised to continue the Augmentin for her dental infection, patient was provided with outpatient dental resources and advised to come back to the ED for any new or worsening symptoms.   Amount and/or Complexity of Data Reviewed Labs: ordered.  Risk Prescription drug management.           Final Clinical Impression(s) / ED Diagnoses Final diagnoses:  Dental abscess    Rx / DC Orders ED Discharge Orders     None         Ma Rings, PA-C 02/26/23 1617    Coral Spikes, DO 02/27/23 [phone]

## 2023-02-26 NOTE — Discharge Instructions (Addendum)
You are seen in the emergency department today for dental pain, you have an abscess which we drained.  Please continue taking the rest of the Augmentin as prescribed.  You need to see a dentist for follow-up.  Can take over-the-counter Tylenol and ibuprofen as needed for discomfort.  Come back to the ER if you have facial swelling, fevers, trouble swallowing or breathing or any other worrisome changes.  Your urine culture did not grow out significant amount of bacteria from urgent care several days ago and your urine looks better today with no red blood cells or white blood cells, so no need for different antibiotics for possible UTI.  If you continue to have burning with urination you need to follow-up with your primary care doctor.

## 2023-03-15 NOTE — Progress Notes (Unsigned)
   New Patient Office Visit   Subjective   Patient ID: Alexandra Floyd, female    DOB: 1965/04/06  Age: 58 y.o. MRN: [id number]  CC: No chief complaint on file.   HPI Alexandra Floyd 58 year old female, presents to establish care. She  has a past medical history of Anxiety, Cellulitis, Depression, Heart murmur, Hep C w/ coma, chronic, and MRSA cellulitis.  HPI    Outpatient Encounter Medications as of 03/17/2023  Medication Sig   amoxicillin-clavulanate (AUGMENTIN) 875-125 MG tablet Take 1 tablet by mouth every 12 (twelve) hours.   chlorhexidine (PERIDEX) 0.12 % solution Use as directed 15 mLs in the mouth or throat 2 (two) times daily.   lidocaine (XYLOCAINE) 2 % solution Use as directed 10 mLs in the mouth or throat every 3 (three) hours as needed.   No facility-administered encounter medications on file as of 03/17/2023.    Past Surgical History:  Procedure Laterality Date   arm sx     CHOLECYSTECTOMY      ROS    Objective    There were no vitals taken for this visit.  Physical Exam    Assessment & Plan:  There are no diagnoses linked to this encounter.  No follow-ups on file.   Alexandra Lederer Newman Nip, FNP

## 2023-03-15 NOTE — Patient Instructions (Signed)

## 2023-03-17 ENCOUNTER — Encounter: Payer: Commercial Managed Care - HMO | Admitting: Family Medicine

## 2023-03-17 DIAGNOSIS — Z1211 Encounter for screening for malignant neoplasm of colon: Secondary | ICD-10-CM

## 2023-03-17 DIAGNOSIS — R7301 Impaired fasting glucose: Secondary | ICD-10-CM

## 2023-03-17 DIAGNOSIS — Z136 Encounter for screening for cardiovascular disorders: Secondary | ICD-10-CM

## 2023-03-17 DIAGNOSIS — Z1231 Encounter for screening mammogram for malignant neoplasm of breast: Secondary | ICD-10-CM

## 2023-03-17 DIAGNOSIS — E559 Vitamin D deficiency, unspecified: Secondary | ICD-10-CM

## 2023-03-17 DIAGNOSIS — E038 Other specified hypothyroidism: Secondary | ICD-10-CM

## 2023-03-17 NOTE — Progress Notes (Signed)
No show

## 2023-03-30 ENCOUNTER — Encounter: Payer: Self-pay | Admitting: Family Medicine

## 2023-04-17 ENCOUNTER — Other Ambulatory Visit: Payer: Self-pay

## 2023-04-17 ENCOUNTER — Encounter (HOSPITAL_COMMUNITY): Payer: Self-pay

## 2023-04-17 ENCOUNTER — Emergency Department (HOSPITAL_COMMUNITY)
Admission: EM | Admit: 2023-04-17 | Discharge: 2023-04-17 | Disposition: A | Discharge status: Home / Self Care | Payer: MEDICAID | Attending: Emergency Medicine | Admitting: Emergency Medicine

## 2023-04-17 ENCOUNTER — Emergency Department (HOSPITAL_COMMUNITY): Payer: MEDICAID

## 2023-04-17 DIAGNOSIS — M7989 Other specified soft tissue disorders: Secondary | ICD-10-CM | POA: Diagnosis not present

## 2023-04-17 DIAGNOSIS — L089 Local infection of the skin and subcutaneous tissue, unspecified: Secondary | ICD-10-CM | POA: Diagnosis present

## 2023-04-17 DIAGNOSIS — H9212 Otorrhea, left ear: Secondary | ICD-10-CM | POA: Diagnosis not present

## 2023-04-17 LAB — BASIC METABOLIC PANEL
Anion gap: 11 (ref 5–15)
BUN: 15 mg/dL (ref 6–20)
CO2: 20 mmol/L — ABNORMAL LOW (ref 22–32)
Calcium: 9.3 mg/dL (ref 8.9–10.3)
Chloride: 107 mmol/L (ref 98–111)
Creatinine, Ser: 0.7 mg/dL (ref 0.44–1.00)
GFR, Estimated: >60 mL/min (ref >60)
Glucose, Bld: 89 mg/dL (ref 70–99)
Potassium: 4.2 mmol/L (ref 3.5–5.1)
Sodium: 138 mmol/L (ref 135–145)

## 2023-04-17 LAB — CBC
HCT: 42.9 % (ref 36.0–46.0)
Hemoglobin: 13.8 g/dL (ref 12.0–15.0)
MCH: 30.9 pg (ref 26.0–34.0)
MCHC: 32.2 g/dL (ref 30.0–36.0)
MCV: 96.2 fL (ref 80.0–100.0)
Platelets: 257 10*3/uL (ref 150–400)
RBC: 4.46 MIL/uL (ref 3.87–5.11)
RDW: 13.1 % (ref 11.5–15.5)
WBC: 3.8 10*3/uL — ABNORMAL LOW (ref 4.0–10.5)
nRBC: 0 % (ref 0.0–0.2)

## 2023-04-17 MED ORDER — DOXYCYCLINE HYCLATE 100 MG PO CAPS: 100.0000 mg | ORAL_CAPSULE | Freq: Two times a day (BID) | ORAL | 0 refills | Status: AC

## 2023-04-17 MED ORDER — KETOROLAC TROMETHAMINE 15 MG/ML IJ SOLN
15.0000 mg | Freq: Once | INTRAMUSCULAR | Status: AC
  Administered 2023-04-17: 15 mg via INTRAMUSCULAR
  Filled 2023-04-17: qty 1

## 2023-04-17 MED ORDER — OFLOXACIN 0.3 % OT SOLN: 10.0000 [drp] | Freq: Two times a day (BID) | OTIC | 0 refills | Status: AC

## 2023-04-17 MED ORDER — DOXYCYCLINE HYCLATE 100 MG PO TABS
100.0000 mg | ORAL_TABLET | Freq: Once | ORAL | Status: AC
  Administered 2023-04-17: 100 mg via ORAL
  Filled 2023-04-17: qty 1

## 2023-04-17 NOTE — ED Provider Notes (Signed)
 [place] EMERGENCY DEPARTMENT AT [facility] Provider Note   CSN: [id number] Arrival date & time: 04/17/23  1226     History  Chief Complaint  Patient presents with   Finger Injury    Alexandra Floyd is a 58 y.o. female. She presents the ER for swelling to the left middle finger, she states she was working in her garden 2 days ago when something cut her finger, she is not sure what it was but now she having swelling, pain and decreased flexion of that finger.  The laceration was on the palmar surface.  It has not been bleeding or oozing.  She has not had any fevers, otherwise feeling like her usual self.  HPI     Home Medications Prior to Admission medications   Medication Sig Start Date End Date Taking? Authorizing Provider  doxycycline (VIBRAMYCIN) 100 MG capsule Take 1 capsule (100 mg total) by mouth 2 (two) times daily for 7 days. 04/17/23 04/24/23 Yes Celese Banner A, PA-C  ofloxacin (FLOXIN) 0.3 % OTIC solution Place 10 drops into the left ear 2 (two) times daily for 10 days. 04/17/23 04/27/23 Yes Bibiana Gillean A, PA-C  amoxicillin-clavulanate (AUGMENTIN) 875-125 MG tablet Take 1 tablet by mouth every 12 (twelve) hours. 02/24/23   Particia Nearing, PA-C  chlorhexidine (PERIDEX) 0.12 % solution Use as directed 15 mLs in the mouth or throat 2 (two) times daily. 02/24/23   Particia Nearing, PA-C  lidocaine (XYLOCAINE) 2 % solution Use as directed 10 mLs in the mouth or throat every 3 (three) hours as needed. 02/24/23   Particia Nearing, PA-C      Allergies    Lasix [furosemide]    Review of Systems   Review of Systems  Physical Exam Updated Vital Signs BP 110/78 (BP Location: Right Arm)   Pulse 76   Temp 98.1 F (36.7 C) (Oral)   Resp 20   Ht 5\' 3"  (1.6 m)   Wt 74.7 kg   SpO2 99%   BMI 29.17 kg/m  Physical Exam Vitals and nursing note reviewed.  Constitutional:      General: She is not in acute distress.    Appearance: She is  well-developed.  HENT:     Head: Normocephalic and atraumatic.     Left Ear: External ear normal.     Ears:     Comments: Left ear canal slightly swollen, no drainage noted, left TM not able to be visualized.    Mouth/Throat:     Mouth: Mucous membranes are moist.  Eyes:     Conjunctiva/sclera: Conjunctivae normal.  Cardiovascular:     Rate and Rhythm: Normal rate and regular rhythm.     Heart sounds: No murmur heard. Pulmonary:     Effort: Pulmonary effort is normal. No respiratory distress.     Breath sounds: Normal breath sounds.  Abdominal:     Palpations: Abdomen is soft.     Tenderness: There is no abdominal tenderness.  Musculoskeletal:        General: No swelling.     Cervical back: Neck supple.     Comments: Swelling diffusely of left ring finger, patient can fully flex and extend but has pain on flexion.  Skin:    General: Skin is warm and dry.     Capillary Refill: Capillary refill takes less than 2 seconds.  Neurological:     General: No focal deficit present.     Mental Status: She is alert and oriented to  person, place, and time.  Psychiatric:        Mood and Affect: Mood normal.     ED Results / Procedures / Treatments   Labs (all labs ordered are listed, but only abnormal results are displayed) Labs Reviewed  CBC - Abnormal; Notable for the following components:      Result Value   WBC 3.8 (*)    All other components within normal limits  BASIC METABOLIC PANEL - Abnormal; Notable for the following components:   CO2 20 (*)    All other components within normal limits    EKG None  Radiology DG Finger Middle Left Result Date: 04/17/2023 CLINICAL DATA:  Left middle finger laceration at the level of the proximal interphalangeal joint EXAM: LEFT MIDDLE FINGER 3V COMPARISON:  None Available. FINDINGS: There is no evidence of fracture or dislocation. Degenerative changes of the middle finger at the interphalangeal joints characterized by mild joint space  narrowing. Diffuse soft tissue swelling of the middle finger. No radiopaque foreign body. IMPRESSION: 1. Diffuse soft tissue swelling of the middle finger. No radiopaque foreign body. 2.  No acute fracture or dislocation. Electronically Signed   By: Agustin Cree M.D.   On: 04/17/2023 14:48    Procedures Procedures    Medications Ordered in ED Medications  ketorolac (TORADOL) 15 MG/ML injection 15 mg (15 mg Intramuscular Given 04/17/23 1401)  doxycycline (VIBRA-TABS) tablet 100 mg (100 mg Oral Given 04/17/23 1553)    ED Course/ Medical Decision Making/ A&P                                 Medical Decision Making Dx: Laceration, cellulitis, abscess, flexor tenosynovitis, other  Course: Patient presents for finger pain and swelling after a laceration with unknown object several days ago.  She can keep it clean and dry at home but now is having pain and swelling in the finger diffusely she is no fevers, vital signs are normal.  She is able to fully flex and extend but it is painful, she has some pain radiating into her hand and forearm but no redness over any of it.  I do not feel this represents flexor tenosynovitis at this time.  She does likely have a wound infection.  X-ray does not show any foreign body and patient does not have foreign body sensation.  Discussed with patient to get up retained nonopaque foreign body, though likely just a skin infection, will treat with doxycycline have her follow-up closely with hand.  She agreed with this.  She is also complaining of several days of left ear pain and otorrhea.  Unable to visualize TM on exam as her canal is swollen, no significant tenderness to the outer ear.  Discussed with patient could be otitis media with rupture that I cannot visualize versus otitis media, also ofloxacin and have her follow-up with her PCP/or ENT.  Patient agreed with plan of care and discharge.  Was given Toradol for pain with mild improvement, advised she can continue  taking ibuprofen at home.  Amount and/or Complexity of Data Reviewed Labs: ordered. Radiology: ordered.  Risk Prescription drug management.           Final Clinical Impression(s) / ED Diagnoses Final diagnoses:  Wound infection  Otorrhea of left ear    Rx / DC Orders ED Discharge Orders          Ordered    doxycycline (VIBRAMYCIN) 100  MG capsule  2 times daily        04/17/23 1542    ofloxacin (FLOXIN) 0.3 % OTIC solution  2 times daily        04/17/23 1546              Josem Kaufmann 04/17/23 1556    Eber Hong, MD 04/19/23 [phone]

## 2023-04-17 NOTE — Discharge Instructions (Addendum)
 Today for an infection in your left middle finger.  We are starting you on antibiotics.  Please follow-up closely with Dr. Romeo Apple.  Come back to the ER if you have fevers, increased swelling or redness or any other new or worsening symptoms.    Eardrops for left ear infection.  Follow-up closely with your PCP.  If not getting better you can follow-up with ENT.

## 2023-04-17 NOTE — ED Triage Notes (Signed)
 Pt arrived via POV c/o injury to her left middle finger. Pt reports 2 days ago she was picking things up in her yard when she accidentally cut her finger. Swelling noted in Triage with limited mobility.

## 2023-05-10 ENCOUNTER — Emergency Department (HOSPITAL_COMMUNITY): Payer: MEDICAID

## 2023-05-10 ENCOUNTER — Other Ambulatory Visit: Payer: Self-pay

## 2023-05-10 ENCOUNTER — Encounter (HOSPITAL_COMMUNITY): Payer: Self-pay | Admitting: Emergency Medicine

## 2023-05-10 ENCOUNTER — Emergency Department (HOSPITAL_COMMUNITY)
Admission: EM | Admit: 2023-05-10 | Discharge: 2023-05-10 | Discharge status: Against Medical Advice | Payer: MEDICAID | Attending: Emergency Medicine | Admitting: Emergency Medicine

## 2023-05-10 DIAGNOSIS — Y92410 Unspecified street and highway as the place of occurrence of the external cause: Secondary | ICD-10-CM | POA: Insufficient documentation

## 2023-05-10 DIAGNOSIS — S80212A Abrasion, left knee, initial encounter: Secondary | ICD-10-CM | POA: Insufficient documentation

## 2023-05-10 DIAGNOSIS — S0181XA Laceration without foreign body of other part of head, initial encounter: Secondary | ICD-10-CM | POA: Insufficient documentation

## 2023-05-10 DIAGNOSIS — Y906 Blood alcohol level of 120-199 mg/100 ml: Secondary | ICD-10-CM | POA: Diagnosis not present

## 2023-05-10 DIAGNOSIS — Z23 Encounter for immunization: Secondary | ICD-10-CM | POA: Diagnosis not present

## 2023-05-10 DIAGNOSIS — Z5329 Procedure and treatment not carried out because of patient's decision for other reasons: Secondary | ICD-10-CM | POA: Diagnosis not present

## 2023-05-10 DIAGNOSIS — S6992XA Unspecified injury of left wrist, hand and finger(s), initial encounter: Secondary | ICD-10-CM | POA: Diagnosis present

## 2023-05-10 DIAGNOSIS — S61412A Laceration without foreign body of left hand, initial encounter: Secondary | ICD-10-CM | POA: Diagnosis not present

## 2023-05-10 DIAGNOSIS — R780 Finding of alcohol in blood: Secondary | ICD-10-CM | POA: Insufficient documentation

## 2023-05-10 DIAGNOSIS — S80211A Abrasion, right knee, initial encounter: Secondary | ICD-10-CM | POA: Insufficient documentation

## 2023-05-10 LAB — CBC
HCT: 42.4 % (ref 36.0–46.0)
Hemoglobin: 14 g/dL (ref 12.0–15.0)
MCH: 31 pg (ref 26.0–34.0)
MCHC: 33 g/dL (ref 30.0–36.0)
MCV: 94 fL (ref 80.0–100.0)
Platelets: 289 10*3/uL (ref 150–400)
RBC: 4.51 MIL/uL (ref 3.87–5.11)
RDW: 13.2 % (ref 11.5–15.5)
WBC: 7.7 10*3/uL (ref 4.0–10.5)
nRBC: 0 % (ref 0.0–0.2)

## 2023-05-10 LAB — COMPREHENSIVE METABOLIC PANEL WITH GFR
ALT: 36 U/L (ref 0–44)
AST: 42 U/L — ABNORMAL HIGH (ref 15–41)
Albumin: 4.2 g/dL (ref 3.5–5.0)
Alkaline Phosphatase: 89 U/L (ref 38–126)
Anion gap: 13 (ref 5–15)
BUN: 13 mg/dL (ref 6–20)
CO2: 20 mmol/L — ABNORMAL LOW (ref 22–32)
Calcium: 9.3 mg/dL (ref 8.9–10.3)
Chloride: 101 mmol/L (ref 98–111)
Creatinine, Ser: 0.81 mg/dL (ref 0.44–1.00)
GFR, Estimated: >60 mL/min (ref >60)
Glucose, Bld: 92 mg/dL (ref 70–99)
Potassium: 3.8 mmol/L (ref 3.5–5.1)
Sodium: 134 mmol/L — ABNORMAL LOW (ref 135–145)
Total Bilirubin: 0.5 mg/dL (ref 0.0–1.2)
Total Protein: 8.8 g/dL — ABNORMAL HIGH (ref 6.5–8.1)

## 2023-05-10 LAB — SAMPLE TO BLOOD BANK

## 2023-05-10 LAB — URINALYSIS, ROUTINE W REFLEX MICROSCOPIC
Bacteria, UA: NONE SEEN
Bilirubin Urine: NEGATIVE
Glucose, UA: NEGATIVE mg/dL
Ketones, ur: NEGATIVE mg/dL
Leukocytes,Ua: NEGATIVE
Nitrite: NEGATIVE
Protein, ur: NEGATIVE mg/dL
Specific Gravity, Urine: 1.003 — ABNORMAL LOW (ref 1.005–1.030)
pH: 5 (ref 5.0–8.0)

## 2023-05-10 LAB — PROTIME-INR
INR: 0.9 (ref 0.8–1.2)
Prothrombin Time: 12.3 s (ref 11.4–15.2)

## 2023-05-10 LAB — LACTIC ACID, PLASMA: Lactic Acid, Venous: 2 mmol/L (ref 0.5–1.9)

## 2023-05-10 LAB — ETHANOL: Alcohol, Ethyl (B): 173 mg/dL — ABNORMAL HIGH (ref <10)

## 2023-05-10 MED ORDER — LIDOCAINE-EPINEPHRINE-TETRACAINE (LET) TOPICAL GEL
3.0000 mL | Freq: Once | TOPICAL | Status: AC
  Administered 2023-05-10: 3 mL via TOPICAL
  Filled 2023-05-10: qty 3

## 2023-05-10 MED ORDER — TETANUS-DIPHTH-ACELL PERTUSSIS 5-2.5-18.5 LF-MCG/0.5 IM SUSY
0.5000 mL | PREFILLED_SYRINGE | Freq: Once | INTRAMUSCULAR | Status: AC
  Administered 2023-05-10: 0.5 mL via INTRAMUSCULAR
  Filled 2023-05-10: qty 0.5

## 2023-05-10 MED ORDER — LIDOCAINE HCL (PF) 1 % IJ SOLN
5.0000 mL | Freq: Once | INTRAMUSCULAR | Status: AC
  Administered 2023-05-10: 5 mL
  Filled 2023-05-10: qty 5

## 2023-05-10 MED ORDER — MORPHINE SULFATE (PF) 4 MG/ML IV SOLN
4.0000 mg | Freq: Once | INTRAVENOUS | Status: AC
  Administered 2023-05-10: 4 mg via INTRAVENOUS
  Filled 2023-05-10: qty 1

## 2023-05-10 MED ORDER — IOHEXOL 300 MG/ML  SOLN
100.0000 mL | Freq: Once | INTRAMUSCULAR | Status: AC | PRN
  Administered 2023-05-10: 100 mL via INTRAVENOUS

## 2023-05-10 MED ORDER — SODIUM CHLORIDE 0.9 % IV BOLUS
1000.0000 mL | Freq: Once | INTRAVENOUS | Status: AC
  Administered 2023-05-10: 1000 mL via INTRAVENOUS

## 2023-05-10 MED ORDER — ONDANSETRON HCL 4 MG/2ML IJ SOLN
4.0000 mg | Freq: Once | INTRAMUSCULAR | Status: AC
  Administered 2023-05-10: 4 mg via INTRAVENOUS
  Filled 2023-05-10: qty 2

## 2023-05-10 NOTE — ED Notes (Signed)
 Pt threatening to leave. Stated, "I just want to go home. I want to be with my dog." EDP was notified

## 2023-05-10 NOTE — ED Notes (Signed)
 Patient transported to CT
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
## 2023-05-10 NOTE — Discharge Instructions (Signed)
Sutures need to be removed in 7 days.

## 2023-05-10 NOTE — ED Provider Notes (Cosign Needed)
 Provided wound care for this patient:  Laceration left dorsal hand,  subcutaneous, irregular,  hemostatic.  LACERATION REPAIR Performed by: Burgess Amor Authorized by: Burgess Amor Consent: Verbal consent obtained. Risks and benefits: risks, benefits and alternatives were discussed Consent given by: patient Patient identity confirmed: provided demographic data Prepped and Draped in normal sterile fashion Wound explored  Laceration Location: left dorsal hand  Laceration Length: 2 cm  No Foreign Bodies seen or palpated  Anesthesia: local infiltration  Local anesthetic: lidocaine 1% without epinephrine  Anesthetic total: 4 ml  Irrigation method: syringe Amount of cleaning: standard  Skin closure: prolene 4-0  Number of sutures: 6  Technique: simple interupted  Patient tolerance: Patient tolerated the procedure well with no immediate complications.    LACERATION REPAIR Performed by: Burgess Amor Authorized by: Burgess Amor Consent: Verbal consent obtained. Risks and benefits: risks, benefits and alternatives were discussed Consent given by: patient Patient identity confirmed: provided demographic data Prepped and Draped in normal sterile fashion Wound explored  Laceration Location: left forehead  Laceration Length: 1.5 cm  No Foreign Bodies seen or palpated   Local anesthetic: topical LET  Anesthetic total: 3 ml  Irrigation method: syringe Amount of cleaning: moderate after betadine   Skin closure: dermabond on inferior linear laceration left forehead,  deeper avulsion superior margin required 2 sutures  Number of sutures: 2  Technique: ethilon 5-0  Patient tolerance: Patient tolerated the procedure well with no immediate complications.     Burgess Amor, PA-C 05/10/23 2220

## 2023-06-01 ENCOUNTER — Other Ambulatory Visit: Payer: Self-pay

## 2023-06-01 ENCOUNTER — Emergency Department (HOSPITAL_COMMUNITY)
Admission: EM | Admit: 2023-06-01 | Discharge: 2023-06-01 | Disposition: A | Discharge status: Home / Self Care | Payer: MEDICAID | Attending: Emergency Medicine | Admitting: Emergency Medicine

## 2023-06-01 ENCOUNTER — Encounter (HOSPITAL_COMMUNITY): Payer: Self-pay

## 2023-06-01 DIAGNOSIS — L03114 Cellulitis of left upper limb: Secondary | ICD-10-CM | POA: Insufficient documentation

## 2023-06-01 DIAGNOSIS — Z4802 Encounter for removal of sutures: Secondary | ICD-10-CM | POA: Diagnosis not present

## 2023-06-01 DIAGNOSIS — Z5189 Encounter for other specified aftercare: Secondary | ICD-10-CM

## 2023-06-01 DIAGNOSIS — Z4800 Encounter for change or removal of nonsurgical wound dressing: Secondary | ICD-10-CM | POA: Diagnosis present

## 2023-06-01 MED ORDER — CEPHALEXIN 500 MG PO CAPS
500.0000 mg | ORAL_CAPSULE | Freq: Once | ORAL | Status: AC
  Administered 2023-06-01: 500 mg via ORAL
  Filled 2023-06-01: qty 1

## 2023-06-01 MED ORDER — ACETAMINOPHEN 500 MG PO TABS
1000.0000 mg | ORAL_TABLET | Freq: Once | ORAL | Status: AC
  Administered 2023-06-01: 1000 mg via ORAL
  Filled 2023-06-01: qty 2

## 2023-06-01 MED ORDER — CEPHALEXIN 500 MG PO CAPS
500.0000 mg | ORAL_CAPSULE | Freq: Four times a day (QID) | ORAL | 0 refills | Status: DC
Start: 2023-06-01 — End: 2023-10-06

## 2023-06-01 MED ORDER — KETOROLAC TROMETHAMINE 60 MG/2ML IM SOLN
15.0000 mg | Freq: Once | INTRAMUSCULAR | Status: AC
  Administered 2023-06-01: 15 mg via INTRAMUSCULAR
  Filled 2023-06-01: qty 2

## 2023-06-01 MED ORDER — SULFAMETHOXAZOLE-TRIMETHOPRIM 800-160 MG PO TABS: 1.0000 | ORAL_TABLET | Freq: Two times a day (BID) | ORAL | 0 refills | Status: AC

## 2023-06-01 MED ORDER — SULFAMETHOXAZOLE-TRIMETHOPRIM 800-160 MG PO TABS
1.0000 | ORAL_TABLET | Freq: Once | ORAL | Status: AC
  Administered 2023-06-01: 1 via ORAL
  Filled 2023-06-01: qty 1

## 2023-06-01 NOTE — Discharge Instructions (Addendum)
 Thank you for coming to [facility] Emergency Department. You were seen for suture removal and concern for infection in your left hand. We removed the sutures. Please take bactrim  twice per day for 7 days and keflex  four times per day for 5 days. You can alternate taking Tylenol  and ibuprofen  as needed for pain. You can take 650mg  tylenol  (acetaminophen ) every 4-6 hours, and 600 mg ibuprofen  3 times a day. Please follow up with your primary care provider within 1 week.   Do not hesitate to return to the ED or call 911 if you experience: -Worsening symptoms -Lightheadedness, passing out -Fevers/chills -Anything else that concerns you

## 2023-06-01 NOTE — ED Provider Notes (Signed)
 [place] EMERGENCY DEPARTMENT AT [facility] Provider Note   CSN: [id number] Arrival date & time: 06/01/23  1642     History  Chief Complaint  Patient presents with   Wound Check    Alexandra Floyd is a 58 y.o. female with PMH as listed below who presents with possible infection in her left hand around suture site and is requesting suture removal from her hand and her head. .    Past Medical History:  Diagnosis Date   Anxiety    Cellulitis    Depression    Heart murmur    Hep C w/ coma, chronic    MRSA cellulitis        Home Medications Prior to Admission medications   Medication Sig Start Date End Date Taking? Authorizing Provider  cephALEXin  (KEFLEX ) 500 MG capsule Take 1 capsule (500 mg total) by mouth 4 (four) times daily. 06/01/23  Yes Merdis Stalling, MD  sulfamethoxazole -trimethoprim  (BACTRIM  DS) 800-160 MG tablet Take 1 tablet by mouth 2 (two) times daily for 7 days. 06/02/23 06/09/23 Yes Merdis Stalling, MD  chlorhexidine  (PERIDEX ) 0.12 % solution Use as directed 15 mLs in the mouth or throat 2 (two) times daily. 02/24/23   Corbin Dess, PA-C  lidocaine  (XYLOCAINE ) 2 % solution Use as directed 10 mLs in the mouth or throat every 3 (three) hours as needed. 02/24/23   Corbin Dess, PA-C      Allergies    Lasix [furosemide]    Review of Systems   Review of Systems A 10 point review of systems was performed and is negative unless otherwise reported in HPI.  Physical Exam Updated Vital Signs BP 132/89   Pulse 79   Temp 98.4 F (36.9 C)   Resp (!) 22   Ht 5\' 3"  (1.6 m)   Wt 74.7 kg   SpO2 99%   BMI 29.17 kg/m  Physical Exam General: Normal appearing female, lying in bed.  HEENT: NCAT, PERRLA, Sclera anicteric, MMM, trachea midline.  Well-healing wound in left forehead and hairline with 2 loose sutures. Cardiology: Regular rate and rhythm Resp: Normal respiratory rate and effort.  Abd: Soft, non-tender, non-distended. No rebound  tenderness or guarding.  GU: Deferred. MSK: Healing wound around dorsum of left hand that is indurated, tender, with 2 pinpoint areas draining purulent drainage.  Intact pulses.  NVI.  Easily able to move all fingers and wrist without any pain.  Compartments soft. 5 sutures visible. Skin: warm, dry.  Neuro: A&Ox4, CNs II-XII grossly intact. MAEs. Sensation grossly intact.  Psych: Normal mood and affect.        ED Results / Procedures / Treatments   Labs (all labs ordered are listed, but only abnormal results are displayed) Labs Reviewed - No data to display  EKG None  Radiology No results found.  Procedures .Ultrasound ED Soft Tissue  Date/Time: 06/01/2023 6:42 PM  Performed by: Merdis Stalling, MD Authorized by: Merdis Stalling, MD   Procedure details:    Indications: localization of abscess and evaluate for cellulitis     Transverse view:  Visualized   Longitudinal view:  Visualized   Images: not archived   Location:    Location: upper extremity     Side:  Left Findings:     no abscess present    cellulitis present    no foreign body present Suture Removal  Date/Time: 06/01/2023 6:44 PM  Performed by: Merdis Stalling, MD Authorized by: Annita Kindle  N, MD   Consent:    Consent obtained:  Verbal   Consent given by:  Patient   Risks discussed:  Bleeding, pain and wound separation   Alternatives discussed:  No treatment Universal protocol:    Patient identity confirmed:  Verbally with patient Location:    Location:  Upper extremity   Upper extremity location:  Hand   Hand location:  L hand Procedure details:    Wound appearance:  Purulent, red, warm, tender and indurated   Number of sutures removed:  5 Post-procedure details:    Procedure completion:  Tolerated well, no immediate complications Suture Removal  Date/Time: 06/01/2023 6:44 PM  Performed by: Merdis Stalling, MD Authorized by: Merdis Stalling, MD   Consent:    Consent obtained:  Verbal    Consent given by:  Patient   Risks discussed:  Pain, bleeding and wound separation   Alternatives discussed:  No treatment Universal protocol:    Patient identity confirmed:  Verbally with patient Location:    Location:  Head/neck   Head/neck location:  Forehead Procedure details:    Wound appearance:  No signs of infection, nontender, good wound healing and clean   Number of sutures removed:  2 Post-procedure details:    Procedure completion:  Tolerated well, no immediate complications     Medications Ordered in ED Medications  ketorolac  (TORADOL ) injection 15 mg (has no administration in time range)  acetaminophen  (TYLENOL ) tablet 1,000 mg (has no administration in time range)  sulfamethoxazole -trimethoprim  (BACTRIM  DS) 800-160 MG per tablet 1 tablet (has no administration in time range)  cephALEXin  (KEFLEX ) capsule 500 mg (has no administration in time range)    ED Course/ Medical Decision Making/ A&P                          Medical Decision Making Risk OTC drugs. Prescription drug management.    MDM:    No c/f deep space hand infection, easily able to move all fingers/wrist. No c/f flexor or extensor tenosynovitis. No abscess identified on BSUS. Patient with purulent cellulitis will treat w/ keflex  and bactrim . HDS, no fever or tachycardia, well-appearing, no c/f sepsis. Sutures removed. Instructed to f/u with PCP within 1 week.  Instructed to return to the ED within 48 hours if symptoms have not improved.  Given specific discharge instructions and return precautions, all questions answered to patient satisfaction.     Additional history obtained from chart review.     Reevaluation: After the interventions noted above, I reevaluated the patient and found that they have :stayed the same  Social Determinants of Health:  lives independently  Disposition: DC  Co morbidities that complicate the patient evaluation  Past Medical History:  Diagnosis Date   Anxiety     Cellulitis    Depression    Heart murmur    Hep C w/ coma, chronic    MRSA cellulitis      Medicines Meds ordered this encounter  Medications   ketorolac  (TORADOL ) injection 15 mg   acetaminophen  (TYLENOL ) tablet 1,000 mg   sulfamethoxazole -trimethoprim  (BACTRIM  DS) 800-160 MG per tablet 1 tablet   sulfamethoxazole -trimethoprim  (BACTRIM  DS) 800-160 MG tablet    Sig: Take 1 tablet by mouth 2 (two) times daily for 7 days.    Dispense:  14 tablet    Refill:  0   cephALEXin  (KEFLEX ) capsule 500 mg   cephALEXin  (KEFLEX ) 500 MG capsule    Sig: Take 1 capsule (500  mg total) by mouth 4 (four) times daily.    Dispense:  20 capsule    Refill:  0    I have reviewed the patients home medicines and have made adjustments as needed  Problem List / ED Course: Problem List Items Addressed This Visit   None Visit Diagnoses       Visit for wound check    -  Primary     Cellulitis of left hand         Visit for suture removal                       This note was created using dictation software, which may contain spelling or grammatical errors.    Merdis Stalling, MD 06/01/23 [phone]

## 2023-06-01 NOTE — ED Triage Notes (Signed)
 Pt arrived via POV c/o possible infection in her left hand around suture site and is requesting suture removal from her hand and her head.

## 2023-07-10 ENCOUNTER — Other Ambulatory Visit: Payer: Self-pay

## 2023-07-10 ENCOUNTER — Emergency Department (HOSPITAL_COMMUNITY)
Admission: EM | Admit: 2023-07-10 | Discharge: 2023-07-10 | Discharge status: Against Medical Advice | Payer: MEDICAID | Attending: Emergency Medicine | Admitting: Emergency Medicine

## 2023-07-10 ENCOUNTER — Encounter (HOSPITAL_COMMUNITY): Payer: Self-pay

## 2023-07-10 DIAGNOSIS — Z5321 Procedure and treatment not carried out due to patient leaving prior to being seen by health care provider: Secondary | ICD-10-CM | POA: Insufficient documentation

## 2023-07-10 DIAGNOSIS — H9202 Otalgia, left ear: Secondary | ICD-10-CM | POA: Insufficient documentation

## 2023-07-10 NOTE — ED Triage Notes (Signed)
 Pt arrived via POV c/o recurrent left ear pain. Pt reports recently getting antibiotics for this and it helped but came back after she finished her prescription.

## 2023-07-10 NOTE — ED Notes (Signed)
 Pt not found in room, did not respond to name called in lobby and not seen outside.

## 2023-08-14 ENCOUNTER — Emergency Department (HOSPITAL_COMMUNITY)
Admission: EM | Admit: 2023-08-14 | Discharge: 2023-08-14 | Disposition: A | Discharge status: Home / Self Care | Payer: MEDICAID | Attending: Emergency Medicine | Admitting: Emergency Medicine

## 2023-08-14 ENCOUNTER — Other Ambulatory Visit: Payer: Self-pay

## 2023-08-14 ENCOUNTER — Encounter (HOSPITAL_COMMUNITY): Payer: Self-pay

## 2023-08-14 DIAGNOSIS — H6692 Otitis media, unspecified, left ear: Secondary | ICD-10-CM | POA: Diagnosis not present

## 2023-08-14 DIAGNOSIS — H60502 Unspecified acute noninfective otitis externa, left ear: Secondary | ICD-10-CM | POA: Diagnosis not present

## 2023-08-14 DIAGNOSIS — H6690 Otitis media, unspecified, unspecified ear: Secondary | ICD-10-CM

## 2023-08-14 DIAGNOSIS — H9202 Otalgia, left ear: Secondary | ICD-10-CM | POA: Diagnosis present

## 2023-08-14 MED ORDER — AMOXICILLIN-POT CLAVULANATE 875-125 MG PO TABS
1.0000 | ORAL_TABLET | Freq: Once | ORAL | Status: AC
  Administered 2023-08-14: 1 via ORAL
  Filled 2023-08-14: qty 1

## 2023-08-14 MED ORDER — AMOXICILLIN-POT CLAVULANATE 875-125 MG PO TABS: 1.0000 | ORAL_TABLET | Freq: Two times a day (BID) | ORAL | 0 refills | Status: AC

## 2023-08-14 MED ORDER — CIPROFLOXACIN-DEXAMETHASONE 0.3-0.1 % OT SUSP: 4.0000 [drp] | Freq: Two times a day (BID) | OTIC | 0 refills | Status: AC

## 2023-08-14 MED ORDER — NAPROXEN 500 MG PO TABS: 500.0000 mg | ORAL_TABLET | Freq: Two times a day (BID) | ORAL | 0 refills | Status: DC

## 2023-08-14 MED ORDER — NAPROXEN 250 MG PO TABS
500.0000 mg | ORAL_TABLET | Freq: Once | ORAL | Status: AC
  Administered 2023-08-14: 500 mg via ORAL
  Filled 2023-08-14: qty 2

## 2023-08-14 NOTE — ED Triage Notes (Signed)
 Pt arrived via POV c/o left side otalgia for past couple of months. Pt reports OTC medications have not helped.

## 2023-08-14 NOTE — Discharge Instructions (Signed)

## 2023-08-14 NOTE — ED Provider Notes (Signed)
 [place] EMERGENCY DEPARTMENT AT [facility] Provider Note   CSN: [id number] Arrival date & time: 08/14/23  1154     Patient presents with: Otalgia   Alexandra Floyd is a 58 y.o. female.   Patient is a 58 year old female who presents emergency department the chief complaint of left ear pain which has been ongoing and intermittent for approximate the past 2 months.  She notes it has become worse over the past few days.  She denies any associated cough, congestion, rhinorrhea, sore throat.  She notes that the pain does radiate down the left side of her neck.  She denies any associated fever or chills.   Otalgia      Prior to Admission medications   Medication Sig Start Date End Date Taking? Authorizing Provider  cephALEXin  (KEFLEX ) 500 MG capsule Take 1 capsule (500 mg total) by mouth 4 (four) times daily. 06/01/23   Franklyn Sid SAILOR, MD  chlorhexidine  (PERIDEX ) 0.12 % solution Use as directed 15 mLs in the mouth or throat 2 (two) times daily. 02/24/23   Stuart Vernell Norris, PA-C  lidocaine  (XYLOCAINE ) 2 % solution Use as directed 10 mLs in the mouth or throat every 3 (three) hours as needed. 02/24/23   Stuart Vernell Norris, PA-C    Allergies: Lasix [furosemide]    Review of Systems  HENT:  Positive for ear pain.   All other systems reviewed and are negative.   Updated Vital Signs BP 122/87 (BP Location: Right Arm)   Pulse 76   Temp 97.7 F (36.5 C) (Oral)   Ht 5' 3 (1.6 m)   Wt 72.6 kg   SpO2 98%   BMI 28.34 kg/m   Physical Exam Vitals and nursing note reviewed.  Constitutional:      Appearance: Normal appearance.  HENT:     Head: Normocephalic and atraumatic.     Ears:     Comments: Left TM with erythema and bulging, left canal with mild erythema, no active discharge or bleeding, external ear unremarkable bilaterally, no mastoid tenderness bilaterally    Nose: Nose normal.     Mouth/Throat:     Mouth: Mucous membranes are moist.  Eyes:      Extraocular Movements: Extraocular movements intact.     Conjunctiva/sclera: Conjunctivae normal.     Pupils: Pupils are equal, round, and reactive to light.  Cardiovascular:     Rate and Rhythm: Normal rate and regular rhythm.     Pulses: Normal pulses.     Heart sounds: Normal heart sounds. No murmur heard.    No gallop.  Pulmonary:     Effort: Pulmonary effort is normal. No respiratory distress.  Musculoskeletal:        General: Normal range of motion.     Cervical back: Normal range of motion and neck supple. No rigidity or tenderness.  Skin:    General: Skin is warm and dry.  Neurological:     General: No focal deficit present.     Mental Status: She is alert and oriented to person, place, and time. Mental status is at baseline.  Psychiatric:        Mood and Affect: Mood normal.        Behavior: Behavior normal.        Thought Content: Thought content normal.        Judgment: Judgment normal.     (all labs ordered are listed, but only abnormal results are displayed) Labs Reviewed - No data to display  EKG:  None  Radiology: No results found.   Procedures   Medications Ordered in the ED  amoxicillin -clavulanate (AUGMENTIN ) 875-125 MG per tablet 1 tablet (has no administration in time range)  naproxen  (NAPROSYN ) tablet 500 mg (has no administration in time range)                                    Medical Decision Making Patient is doing well at this time and is stable for discharge home.  Discussed with patient that we are going to treat her for otitis media as well as otitis externa.  She has no indication for malignant otitis externa, perichondritis, mastoiditis at this time.  She is nontender palpation of the bilateral mastoid processes.  She has no obvious indication for TM perforation.  Patient has stable vital signs with no indication for sepsis.  Discussed with patient but the need for close follow-up with her primary care doctor on an outpatient basis.   Strict turn precautions were discussed for any new or worsening symptoms.  Patient voiced understanding to the plan and had no additional questions.  Risk Prescription drug management.        Final diagnoses:  None    ED Discharge Orders     None          Daralene Lonni JONETTA DEVONNA 08/14/23 1234    Melvenia Motto, MD 08/14/23 1450

## 2023-09-20 ENCOUNTER — Ambulatory Visit
Admission: EM | Admit: 2023-09-20 | Discharge: 2023-09-20 | Disposition: A | Discharge status: Home / Self Care | Payer: MEDICAID | Attending: Nurse Practitioner | Admitting: Nurse Practitioner

## 2023-09-20 DIAGNOSIS — H6092 Unspecified otitis externa, left ear: Secondary | ICD-10-CM

## 2023-09-20 MED ORDER — CIPROFLOXACIN HCL 500 MG PO TABS: 500.0000 mg | ORAL_TABLET | Freq: Two times a day (BID) | ORAL | 0 refills | Status: DC

## 2023-09-20 MED ORDER — OFLOXACIN 0.3 % OT SOLN: 5.0000 [drp] | Freq: Two times a day (BID) | OTIC | 0 refills | Status: AC

## 2023-09-20 NOTE — ED Provider Notes (Signed)
 [facility]    CSN: [id number] Arrival date & time: 09/20/23  1645      History   Chief Complaint No chief complaint on file.   HPI Alexandra Floyd is a 58 y.o. female.   The history is provided by the patient.   Patient presents with a 3 to 4-day history of left-sided ear pain.  Patient states that the pain has worsened over the past day or so.  She states there feels like there is a knot in front of my ear.  She also reports that she cannot hear well out of the left ear.  She also reports that she has a history of cellulitis and is concerned that it may have gone down to the bone.  Patient denies fevers, chills, cough, nasal congestion, runny nose, or sore throat.  Patient reports she was treated for the same or similar symptoms in July in the emergency department.  Past Medical History:  Diagnosis Date   Anxiety    Cellulitis    Depression    Heart murmur    Hep C w/ coma, chronic    MRSA cellulitis     Patient Active Problem List   Diagnosis Date Noted   Encounter for hepatitis C virus screening test for high risk patient 08/17/2022   Pure hypercholesterolemia 08/17/2022   Hypothyroidism (acquired) 08/17/2022   Screening for colon cancer 08/17/2022   GERD (gastroesophageal reflux disease) 08/17/2022   Substance abuse (HCC) 06/10/2021   TSH elevation 06/10/2021   Substance induced mood disorder (HCC) 09/21/2019   Polysubstance abuse (HCC) 09/21/2019   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) 09/21/2019   Cocaine use disorder, mild, abuse (HCC) 09/21/2019   Cocaine intoxication with perceptual disturbances with moderate or severe use disorder (HCC) 09/21/2019   Marinol use disorder, severe, dependence (HCC) 09/21/2019   MDD (major depressive disorder), recurrent episode, severe (HCC) 09/20/2019    Past Surgical History:  Procedure Laterality Date   arm sx     CHOLECYSTECTOMY      OB History   No obstetric history on  file.      Home Medications    Prior to Admission medications   Medication Sig Start Date End Date Taking? Authorizing Provider  ciprofloxacin  (CIPRO ) 500 MG tablet Take 1 tablet (500 mg total) by mouth 2 (two) times daily. 09/20/23  Yes Leath-Warren, Etta PARAS, NP  ofloxacin  (FLOXIN ) 0.3 % OTIC solution Place 5 drops into the left ear 2 (two) times daily for 7 days. 09/20/23 09/27/23 Yes Leath-Warren, Etta PARAS, NP  cephALEXin  (KEFLEX ) 500 MG capsule Take 1 capsule (500 mg total) by mouth 4 (four) times daily. 06/01/23   Franklyn Sid SAILOR, MD  chlorhexidine  (PERIDEX ) 0.12 % solution Use as directed 15 mLs in the mouth or throat 2 (two) times daily. 02/24/23   Stuart Vernell Norris, PA-C  lidocaine  (XYLOCAINE ) 2 % solution Use as directed 10 mLs in the mouth or throat every 3 (three) hours as needed. 02/24/23   Stuart Vernell Norris, PA-C  naproxen  (NAPROSYN ) 500 MG tablet Take 1 tablet (500 mg total) by mouth 2 (two) times daily. 08/14/23   Daralene Lonni BIRCH, PA-C    Family History Family History  Problem Relation Age of Onset   Hypertension Mother     Social History Social History   Tobacco Use   Smoking status: Every Day    Current packs/day: 1.00    Average packs/day: 1 pack/day for 47.6 years (47.6 ttl pk-yrs)  Types: Cigarettes    Start date: 1978   Smokeless tobacco: Never  Vaping Use   Vaping status: Some Days  Substance Use Topics   Alcohol use: Not Currently    Comment: occasionally   Drug use: Not Currently    Types: Methamphetamines, Crack cocaine, Heroin    Comment: occasionally     Allergies   Lasix [furosemide]   Review of Systems Review of Systems Per HPI  Physical Exam Triage Vital Signs ED Triage Vitals  Encounter Vitals Group     BP 09/20/23 1659 123/86     Girls Systolic BP Percentile --      Girls Diastolic BP Percentile --      Boys Systolic BP Percentile --      Boys Diastolic BP Percentile --      Pulse Rate 09/20/23 1659 94      Resp 09/20/23 1659 (!) 22     Temp 09/20/23 1659 98.4 F (36.9 C)     Temp Source 09/20/23 1659 Oral     SpO2 09/20/23 1659 94 %     Weight --      Height --      Head Circumference --      Peak Flow --      Pain Score 09/20/23 1703 6     Pain Loc --      Pain Education --      Exclude from Growth Chart --    No data found.  Updated Vital Signs BP 123/86 (BP Location: Right Arm)   Pulse 94   Temp 98.4 F (36.9 C) (Oral)   Resp (!) 22   SpO2 94%   Visual Acuity Right Eye Distance:   Left Eye Distance:   Bilateral Distance:    Right Eye Near:   Left Eye Near:    Bilateral Near:     Physical Exam Vitals and nursing note reviewed.  Constitutional:      General: She is not in acute distress.    Appearance: Normal appearance.  HENT:     Head: Normocephalic.     Right Ear: Tympanic membrane, ear canal and external ear normal.     Left Ear: External ear normal. Decreased hearing noted. Swelling (Swelling noted in the left ear canal.  Moderate swelling present, unable to clearly visualize the tympanic membrane.) present. No drainage. No mastoid tenderness.     Nose: Nose normal.     Mouth/Throat:     Mouth: Mucous membranes are moist.  Eyes:     Extraocular Movements: Extraocular movements intact.     Pupils: Pupils are equal, round, and reactive to light.  Cardiovascular:     Rate and Rhythm: Normal rate and regular rhythm.     Pulses: Normal pulses.     Heart sounds: Normal heart sounds.  Pulmonary:     Effort: Pulmonary effort is normal. No respiratory distress.     Breath sounds: Normal breath sounds. No stridor. No wheezing, rhonchi or rales.  Musculoskeletal:     Cervical back: Normal range of motion.  Skin:    General: Skin is warm and dry.  Neurological:     General: No focal deficit present.     Mental Status: She is alert and oriented to person, place, and time.  Psychiatric:        Mood and Affect: Mood normal.        Behavior: Behavior normal.       UC Treatments / Results  Labs (all labs  ordered are listed, but only abnormal results are displayed) Labs Reviewed - No data to display  EKG   Radiology No results found.  Procedures Procedures (including critical care time)  Medications Ordered in UC Medications - No data to display  Initial Impression / Assessment and Plan / UC Course  I have reviewed the triage vital signs and the nursing notes.  Pertinent labs & imaging results that were available during my care of the patient were reviewed by me and considered in my medical decision making (see chart for details).  Patient recently treated for otitis externa and otitis media in July.  Symptoms returned over the past 3 to 4 days.  She complains of pain overlying the left ear.  She also reports decreased hearing from the left ear.  On exam, she has moderate swelling to the left ear canal.  Will treat patient for infective otitis media given her history of MRSA.  Will treat with Cipro  500 mg and Floxin  0.3% otic drops.  Supportive care recommendations were provided and discussed with the patient to include follow-up with ENT if symptoms fail to improve, warm compresses, and over-the-counter analgesics.  Patient was in agreement with this plan of care and verbalizes understanding.  All questions were answered.  Patient stable for discharge.  Final Clinical Impressions(s) / UC Diagnoses   Final diagnoses:  Otitis externa of left ear, unspecified chronicity, unspecified type     Discharge Instructions      Take medication as prescribed. You may take over-the-counter Tylenol  as needed for pain, fever, or general discomfort. Apply warm compresses to the left ear to help with pain or discomfort. Avoid entrance of water inside of the ear while symptoms persist. As discussed, if your symptoms fail to improve with this treatment, or recur, recommend follow-up with ENT for further evaluation. Follow-up as needed.     ED  Prescriptions     Medication Sig Dispense Auth. Provider   ciprofloxacin  (CIPRO ) 500 MG tablet Take 1 tablet (500 mg total) by mouth 2 (two) times daily. 14 tablet Leath-Warren, Etta PARAS, NP   ofloxacin  (FLOXIN ) 0.3 % OTIC solution Place 5 drops into the left ear 2 (two) times daily for 7 days. 5 mL Leath-Warren, Etta PARAS, NP      PDMP not reviewed this encounter.   Gilmer Etta PARAS, NP 09/20/23 1801

## 2023-09-20 NOTE — Discharge Instructions (Addendum)
 Take medication as prescribed. You may take over-the-counter Tylenol  as needed for pain, fever, or general discomfort. Apply warm compresses to the left ear to help with pain or discomfort. Avoid entrance of water inside of the ear while symptoms persist. As discussed, if your symptoms fail to improve with this treatment, or recur, recommend follow-up with ENT for further evaluation. Follow-up as needed.

## 2023-09-20 NOTE — ED Triage Notes (Signed)
 Pt reports left ear pain and fullness, and a swollen area under the ear. x 2 days  has recently had ear infection x 2 mo's ago and was on antibiotics for now sx's are back

## 2023-10-04 ENCOUNTER — Ambulatory Visit
Admission: EM | Admit: 2023-10-04 | Discharge: 2023-10-04 | Disposition: A | Discharge status: Home / Self Care | Payer: MEDICAID

## 2023-10-04 NOTE — ED Notes (Signed)
 Pt has been called from the lobby once and from the phone twice, pt failed to respond to any method of contact.

## 2023-10-06 ENCOUNTER — Encounter (HOSPITAL_COMMUNITY): Payer: Self-pay

## 2023-10-06 ENCOUNTER — Other Ambulatory Visit: Payer: Self-pay

## 2023-10-06 ENCOUNTER — Inpatient Hospital Stay (HOSPITAL_COMMUNITY): Payer: MEDICAID | Admitting: Anesthesiology

## 2023-10-06 ENCOUNTER — Encounter (HOSPITAL_COMMUNITY)
Admission: EM | Disposition: A | Discharge status: Home / Self Care | Payer: Self-pay | Source: Home / Self Care | Attending: Internal Medicine

## 2023-10-06 ENCOUNTER — Inpatient Hospital Stay (HOSPITAL_COMMUNITY)
Admission: EM | Admit: 2023-10-06 | Discharge: 2023-10-07 | DRG: 603 | Disposition: A | Discharge status: Home / Self Care | Payer: MEDICAID | Attending: Internal Medicine | Admitting: Internal Medicine

## 2023-10-06 ENCOUNTER — Emergency Department (HOSPITAL_COMMUNITY): Payer: MEDICAID

## 2023-10-06 DIAGNOSIS — F1020 Alcohol dependence, uncomplicated: Secondary | ICD-10-CM | POA: Diagnosis present

## 2023-10-06 DIAGNOSIS — Z9049 Acquired absence of other specified parts of digestive tract: Secondary | ICD-10-CM | POA: Diagnosis not present

## 2023-10-06 DIAGNOSIS — E039 Hypothyroidism, unspecified: Secondary | ICD-10-CM | POA: Diagnosis present

## 2023-10-06 DIAGNOSIS — F17210 Nicotine dependence, cigarettes, uncomplicated: Secondary | ICD-10-CM | POA: Diagnosis present

## 2023-10-06 DIAGNOSIS — Z8614 Personal history of Methicillin resistant Staphylococcus aureus infection: Secondary | ICD-10-CM

## 2023-10-06 DIAGNOSIS — M65041 Abscess of tendon sheath, right hand: Secondary | ICD-10-CM | POA: Diagnosis not present

## 2023-10-06 DIAGNOSIS — M6510 Other infective (teno)synovitis, unspecified site: Secondary | ICD-10-CM | POA: Insufficient documentation

## 2023-10-06 DIAGNOSIS — Z79899 Other long term (current) drug therapy: Secondary | ICD-10-CM

## 2023-10-06 DIAGNOSIS — Z79890 Hormone replacement therapy (postmenopausal): Secondary | ICD-10-CM

## 2023-10-06 DIAGNOSIS — L02511 Cutaneous abscess of right hand: Secondary | ICD-10-CM | POA: Diagnosis present

## 2023-10-06 DIAGNOSIS — B9562 Methicillin resistant Staphylococcus aureus infection as the cause of diseases classified elsewhere: Secondary | ICD-10-CM | POA: Diagnosis present

## 2023-10-06 DIAGNOSIS — R531 Weakness: Secondary | ICD-10-CM | POA: Diagnosis not present

## 2023-10-06 DIAGNOSIS — F1910 Other psychoactive substance abuse, uncomplicated: Secondary | ICD-10-CM | POA: Diagnosis present

## 2023-10-06 DIAGNOSIS — B182 Chronic viral hepatitis C: Secondary | ICD-10-CM | POA: Diagnosis present

## 2023-10-06 DIAGNOSIS — B181 Chronic viral hepatitis B without delta-agent: Secondary | ICD-10-CM | POA: Diagnosis present

## 2023-10-06 DIAGNOSIS — Z888 Allergy status to other drugs, medicaments and biological substances status: Secondary | ICD-10-CM

## 2023-10-06 DIAGNOSIS — L03113 Cellulitis of right upper limb: Secondary | ICD-10-CM | POA: Diagnosis present

## 2023-10-06 DIAGNOSIS — Z8249 Family history of ischemic heart disease and other diseases of the circulatory system: Secondary | ICD-10-CM | POA: Diagnosis not present

## 2023-10-06 DIAGNOSIS — E7800 Pure hypercholesterolemia, unspecified: Secondary | ICD-10-CM | POA: Diagnosis not present

## 2023-10-06 DIAGNOSIS — Z743 Need for continuous supervision: Secondary | ICD-10-CM | POA: Diagnosis not present

## 2023-10-06 DIAGNOSIS — F418 Other specified anxiety disorders: Secondary | ICD-10-CM | POA: Diagnosis not present

## 2023-10-06 HISTORY — PX: INCISION AND DRAINAGE OF WOUND: SHX1803

## 2023-10-06 LAB — URINALYSIS, W/ REFLEX TO CULTURE (INFECTION SUSPECTED)
Bacteria, UA: NONE SEEN
Bilirubin Urine: NEGATIVE
Glucose, UA: NEGATIVE mg/dL
Ketones, ur: NEGATIVE mg/dL
Leukocytes,Ua: NEGATIVE
Nitrite: NEGATIVE
Protein, ur: NEGATIVE mg/dL
Specific Gravity, Urine: 1.014 (ref 1.005–1.030)
pH: 6 (ref 5.0–8.0)

## 2023-10-06 LAB — COMPREHENSIVE METABOLIC PANEL WITH GFR
ALT: 23 U/L (ref 0–44)
AST: 24 U/L (ref 15–41)
Albumin: 3.5 g/dL (ref 3.5–5.0)
Alkaline Phosphatase: 81 U/L (ref 38–126)
Anion gap: 12 (ref 5–15)
BUN: 7 mg/dL (ref 6–20)
CO2: 20 mmol/L — ABNORMAL LOW (ref 22–32)
Calcium: 8.7 mg/dL — ABNORMAL LOW (ref 8.9–10.3)
Chloride: 102 mmol/L (ref 98–111)
Creatinine, Ser: 0.76 mg/dL (ref 0.44–1.00)
GFR, Estimated: >60 mL/min (ref >60)
Glucose, Bld: 112 mg/dL — ABNORMAL HIGH (ref 70–99)
Potassium: 3.6 mmol/L (ref 3.5–5.1)
Sodium: 134 mmol/L — ABNORMAL LOW (ref 135–145)
Total Bilirubin: 0.8 mg/dL (ref 0.0–1.2)
Total Protein: 7.9 g/dL (ref 6.5–8.1)

## 2023-10-06 LAB — CBC WITH DIFFERENTIAL/PLATELET
Abs Immature Granulocytes: 0.03 K/uL (ref 0.00–0.07)
Basophils Absolute: 0.1 K/uL (ref 0.0–0.1)
Basophils Relative: 1 %
Eosinophils Absolute: 0 K/uL (ref 0.0–0.5)
Eosinophils Relative: 0 %
HCT: 37.6 % (ref 36.0–46.0)
Hemoglobin: 12.6 g/dL (ref 12.0–15.0)
Immature Granulocytes: 0 %
Lymphocytes Relative: 16 %
Lymphs Abs: 1.7 K/uL (ref 0.7–4.0)
MCH: 31.8 pg (ref 26.0–34.0)
MCHC: 33.5 g/dL (ref 30.0–36.0)
MCV: 94.9 fL (ref 80.0–100.0)
Monocytes Absolute: 1 K/uL (ref 0.1–1.0)
Monocytes Relative: 10 %
Neutro Abs: 7.6 K/uL (ref 1.7–7.7)
Neutrophils Relative %: 73 %
Platelets: 323 K/uL (ref 150–400)
RBC: 3.96 MIL/uL (ref 3.87–5.11)
RDW: 14.1 % (ref 11.5–15.5)
WBC: 10.4 K/uL (ref 4.0–10.5)
nRBC: 0 % (ref 0.0–0.2)

## 2023-10-06 LAB — SEDIMENTATION RATE: Sed Rate: 40 mm/h — ABNORMAL HIGH (ref 0–30)

## 2023-10-06 LAB — TROPONIN I (HIGH SENSITIVITY): Troponin I (High Sensitivity): 4 ng/L (ref <18)

## 2023-10-06 LAB — LACTIC ACID, PLASMA: Lactic Acid, Venous: 1 mmol/L (ref 0.5–1.9)

## 2023-10-06 LAB — C-REACTIVE PROTEIN: CRP: 4.5 mg/dL — ABNORMAL HIGH (ref <1.0)

## 2023-10-06 SURGERY — IRRIGATION AND DEBRIDEMENT WOUND
Anesthesia: General | Site: Hand | Laterality: Right

## 2023-10-06 MED ORDER — LEVOTHYROXINE SODIUM 25 MCG PO TABS
25.0000 ug | ORAL_TABLET | Freq: Every day | ORAL | Status: DC
  Administered 2023-10-07: 25 ug via ORAL
  Filled 2023-10-06: qty 1

## 2023-10-06 MED ORDER — ACETAMINOPHEN 500 MG PO TABS
1000.0000 mg | ORAL_TABLET | Freq: Four times a day (QID) | ORAL | Status: DC
  Administered 2023-10-07 (×2): 1000 mg via ORAL
  Filled 2023-10-06 (×3): qty 2

## 2023-10-06 MED ORDER — LIDOCAINE 2% (20 MG/ML) 5 ML SYRINGE
INTRAMUSCULAR | Status: DC | PRN
  Administered 2023-10-06: 60 mg via INTRAVENOUS

## 2023-10-06 MED ORDER — ROCURONIUM BROMIDE 10 MG/ML (PF) SYRINGE
PREFILLED_SYRINGE | INTRAVENOUS | Status: AC
  Filled 2023-10-06: qty 20

## 2023-10-06 MED ORDER — ACETAMINOPHEN 10 MG/ML IV SOLN
INTRAVENOUS | Status: DC | PRN
Start: 2023-10-06 — End: 2023-10-06
  Administered 2023-10-06: 1000 mg via INTRAVENOUS

## 2023-10-06 MED ORDER — PROPOFOL 10 MG/ML IV BOLUS
INTRAVENOUS | Status: DC | PRN
  Administered 2023-10-06: 130 mg via INTRAVENOUS

## 2023-10-06 MED ORDER — FENTANYL CITRATE (PF) 100 MCG/2ML IJ SOLN: 25.0000 ug | INTRAMUSCULAR | Status: DC | PRN

## 2023-10-06 MED ORDER — MORPHINE SULFATE (PF) 4 MG/ML IV SOLN
4.0000 mg | Freq: Once | INTRAVENOUS | Status: AC
  Administered 2023-10-06: 4 mg via INTRAVENOUS
  Filled 2023-10-06: qty 1

## 2023-10-06 MED ORDER — ROCURONIUM BROMIDE 10 MG/ML (PF) SYRINGE
PREFILLED_SYRINGE | INTRAVENOUS | Status: DC | PRN
  Administered 2023-10-06: 60 mg via INTRAVENOUS

## 2023-10-06 MED ORDER — BUPIVACAINE HCL (PF) 0.25 % IJ SOLN
INTRAMUSCULAR | Status: AC
Start: 2023-10-06 — End: 2023-10-06
  Filled 2023-10-06: qty 30

## 2023-10-06 MED ORDER — LIDOCAINE 2% (20 MG/ML) 5 ML SYRINGE
INTRAMUSCULAR | Status: AC
  Filled 2023-10-06: qty 5

## 2023-10-06 MED ORDER — POVIDONE-IODINE 10 % EX SWAB
2.0000 | Freq: Once | CUTANEOUS | Status: AC
  Administered 2023-10-06: 2 via TOPICAL

## 2023-10-06 MED ORDER — CEFAZOLIN SODIUM-DEXTROSE 2-4 GM/100ML-% IV SOLN
INTRAVENOUS | Status: AC
  Filled 2023-10-06: qty 100

## 2023-10-06 MED ORDER — OXYCODONE HCL 5 MG PO TABS
5.0000 mg | ORAL_TABLET | Freq: Once | ORAL | Status: AC | PRN
  Administered 2023-10-06: 5 mg via ORAL

## 2023-10-06 MED ORDER — OXYCODONE HCL 5 MG/5ML PO SOLN: 5.0000 mg | Freq: Once | ORAL | Status: AC | PRN

## 2023-10-06 MED ORDER — MIDAZOLAM HCL 2 MG/2ML IJ SOLN
INTRAMUSCULAR | Status: AC
  Filled 2023-10-06: qty 2

## 2023-10-06 MED ORDER — THIAMINE HCL 100 MG/ML IJ SOLN
100.0000 mg | Freq: Every day | INTRAMUSCULAR | Status: DC
  Filled 2023-10-06: qty 1

## 2023-10-06 MED ORDER — BUPIVACAINE HCL (PF) 0.25 % IJ SOLN
INTRAMUSCULAR | Status: DC | PRN
Start: 2023-10-06 — End: 2023-10-06
  Administered 2023-10-06: 10 mL

## 2023-10-06 MED ORDER — FENTANYL CITRATE (PF) 250 MCG/5ML IJ SOLN
INTRAMUSCULAR | Status: DC | PRN
  Administered 2023-10-06: 50 ug via INTRAVENOUS
  Administered 2023-10-06: 100 ug via INTRAVENOUS
  Administered 2023-10-06: 50 ug via INTRAVENOUS

## 2023-10-06 MED ORDER — PHENYLEPHRINE 80 MCG/ML (10ML) SYRINGE FOR IV PUSH (FOR BLOOD PRESSURE SUPPORT)
PREFILLED_SYRINGE | INTRAVENOUS | Status: DC | PRN
  Administered 2023-10-06 (×2): 240 ug via INTRAVENOUS

## 2023-10-06 MED ORDER — PHENYLEPHRINE 80 MCG/ML (10ML) SYRINGE FOR IV PUSH (FOR BLOOD PRESSURE SUPPORT)
PREFILLED_SYRINGE | INTRAVENOUS | Status: AC
  Filled 2023-10-06: qty 10

## 2023-10-06 MED ORDER — CHLORHEXIDINE GLUCONATE 0.12 % MT SOLN
OROMUCOSAL | Status: AC
  Administered 2023-10-06: 15 mL via OROMUCOSAL
  Filled 2023-10-06: qty 15

## 2023-10-06 MED ORDER — CHLORHEXIDINE GLUCONATE 0.12 % MT SOLN
15.0000 mL | Freq: Once | OROMUCOSAL | Status: AC
Start: 2023-10-06 — End: 2023-10-06

## 2023-10-06 MED ORDER — SUGAMMADEX SODIUM 200 MG/2ML IV SOLN
INTRAVENOUS | Status: DC | PRN
  Administered 2023-10-06 (×2): 100 mg via INTRAVENOUS

## 2023-10-06 MED ORDER — FOLIC ACID 1 MG PO TABS
1.0000 mg | ORAL_TABLET | Freq: Every day | ORAL | Status: DC
  Administered 2023-10-07: 1 mg via ORAL
  Filled 2023-10-06 (×2): qty 1

## 2023-10-06 MED ORDER — ONDANSETRON HCL 4 MG/2ML IJ SOLN
4.0000 mg | Freq: Once | INTRAMUSCULAR | Status: AC
  Administered 2023-10-06: 4 mg via INTRAVENOUS
  Filled 2023-10-06: qty 2

## 2023-10-06 MED ORDER — VANCOMYCIN HCL 750 MG/150ML IV SOLN
750.0000 mg | Freq: Two times a day (BID) | INTRAVENOUS | Status: DC
  Administered 2023-10-06 – 2023-10-07 (×2): 750 mg via INTRAVENOUS
  Filled 2023-10-06 (×4): qty 150

## 2023-10-06 MED ORDER — OXYCODONE HCL 5 MG PO TABS
ORAL_TABLET | ORAL | Status: AC
  Filled 2023-10-06: qty 1

## 2023-10-06 MED ORDER — PROPOFOL 10 MG/ML IV BOLUS
INTRAVENOUS | Status: AC
  Filled 2023-10-06: qty 20

## 2023-10-06 MED ORDER — VANCOMYCIN HCL 1500 MG/300ML IV SOLN
1500.0000 mg | Freq: Once | INTRAVENOUS | Status: AC
  Administered 2023-10-06: 1500 mg via INTRAVENOUS
  Filled 2023-10-06: qty 300

## 2023-10-06 MED ORDER — CEFAZOLIN SODIUM-DEXTROSE 2-4 GM/100ML-% IV SOLN: 2.0000 g | INTRAVENOUS | Status: DC

## 2023-10-06 MED ORDER — HYDROMORPHONE HCL 1 MG/ML IJ SOLN
1.0000 mg | Freq: Once | INTRAMUSCULAR | Status: AC
  Administered 2023-10-06: 1 mg via INTRAVENOUS
  Filled 2023-10-06: qty 1

## 2023-10-06 MED ORDER — 0.9 % SODIUM CHLORIDE (POUR BTL) OPTIME
TOPICAL | Status: DC | PRN
  Administered 2023-10-06: 1000 mL

## 2023-10-06 MED ORDER — ONDANSETRON HCL 4 MG/2ML IJ SOLN
INTRAMUSCULAR | Status: AC
Start: 2023-10-06 — End: 2023-10-06
  Filled 2023-10-06: qty 4

## 2023-10-06 MED ORDER — DEXMEDETOMIDINE HCL IN NACL 80 MCG/20ML IV SOLN
INTRAVENOUS | Status: AC
  Filled 2023-10-06: qty 40

## 2023-10-06 MED ORDER — ONDANSETRON HCL 4 MG/2ML IJ SOLN
INTRAMUSCULAR | Status: DC | PRN
  Administered 2023-10-06: 4 mg via INTRAVENOUS

## 2023-10-06 MED ORDER — OXYCODONE HCL 5 MG PO TABS: 5.0000 mg | ORAL_TABLET | ORAL | Status: AC | PRN

## 2023-10-06 MED ORDER — THIAMINE MONONITRATE 100 MG PO TABS
100.0000 mg | ORAL_TABLET | Freq: Every day | ORAL | Status: DC
  Administered 2023-10-07: 100 mg via ORAL
  Filled 2023-10-06 (×2): qty 1

## 2023-10-06 MED ORDER — ORAL CARE MOUTH RINSE: 15.0000 mL | Freq: Once | OROMUCOSAL | Status: AC

## 2023-10-06 MED ORDER — LACTATED RINGERS IV SOLN: INTRAVENOUS | Status: DC

## 2023-10-06 MED ORDER — ACETAMINOPHEN 10 MG/ML IV SOLN: 1000.0000 mg | Freq: Once | INTRAVENOUS | Status: DC | PRN

## 2023-10-06 MED ORDER — DEXMEDETOMIDINE HCL IN NACL 80 MCG/20ML IV SOLN
INTRAVENOUS | Status: DC | PRN
  Administered 2023-10-06: 8 ug via INTRAVENOUS
  Administered 2023-10-06: 4 ug via INTRAVENOUS
  Administered 2023-10-06: 8 ug via INTRAVENOUS

## 2023-10-06 MED ORDER — CHLORHEXIDINE GLUCONATE 4 % EX SOLN: 60.0000 mL | Freq: Once | CUTANEOUS | Status: DC

## 2023-10-06 MED ORDER — FENTANYL CITRATE (PF) 250 MCG/5ML IJ SOLN
INTRAMUSCULAR | Status: AC
  Filled 2023-10-06: qty 5

## 2023-10-06 MED ORDER — ADULT MULTIVITAMIN W/MINERALS CH
1.0000 | ORAL_TABLET | Freq: Every day | ORAL | Status: DC
  Administered 2023-10-07: 1 via ORAL
  Filled 2023-10-06 (×2): qty 1

## 2023-10-06 MED ORDER — SODIUM CHLORIDE 0.9 % IR SOLN
Status: DC | PRN
Start: 2023-10-06 — End: 2023-10-06
  Administered 2023-10-06: 3000 mL

## 2023-10-06 MED ORDER — DEXAMETHASONE SODIUM PHOSPHATE 10 MG/ML IJ SOLN
INTRAMUSCULAR | Status: DC | PRN
  Administered 2023-10-06: 8 mg via INTRAVENOUS

## 2023-10-06 MED ORDER — KETOROLAC TROMETHAMINE 30 MG/ML IJ SOLN
INTRAMUSCULAR | Status: AC
  Filled 2023-10-06: qty 1

## 2023-10-06 MED ORDER — VANCOMYCIN HCL IN DEXTROSE 1-5 GM/200ML-% IV SOLN: 1000.0000 mg | Freq: Once | INTRAVENOUS | Status: DC

## 2023-10-06 MED ORDER — DEXAMETHASONE SODIUM PHOSPHATE 10 MG/ML IJ SOLN
INTRAMUSCULAR | Status: AC
  Filled 2023-10-06: qty 2

## 2023-10-06 SURGICAL SUPPLY — 50 items
ADAPTER CATH SYR TO TUBING 38M (ADAPTER) ×1 IMPLANT
BAG COUNTER SPONGE SURGICOUNT (BAG) ×1 IMPLANT
BNDG COMPR ESMARK 4X3 LF (GAUZE/BANDAGES/DRESSINGS) IMPLANT
BNDG ELASTIC 3INX 5YD STR LF (GAUZE/BANDAGES/DRESSINGS) ×1 IMPLANT
BNDG ELASTIC 4X5.8 VLCR STR LF (GAUZE/BANDAGES/DRESSINGS) ×1 IMPLANT
BNDG GAUZE DERMACEA FLUFF 4 (GAUZE/BANDAGES/DRESSINGS) ×1 IMPLANT
CANNULA VESSEL 3MM 2 BLNT TIP (CANNULA) IMPLANT
CORD BIPOLAR FORCEPS 12FT (ELECTRODE) ×1 IMPLANT
COVER SURGICAL LIGHT HANDLE (MISCELLANEOUS) ×1 IMPLANT
CUFF TOURN SGL QUICK 18X4 (TOURNIQUET CUFF) IMPLANT
CUFF TRNQT CYL 24X4X16.5-23 (TOURNIQUET CUFF) IMPLANT
DRAIN PENROSE 12X.25 LTX STRL (MISCELLANEOUS) IMPLANT
GAUZE PACKING IODOFORM 1/4X15 (PACKING) IMPLANT
GAUZE PAD ABD 8X10 STRL (GAUZE/BANDAGES/DRESSINGS) ×2 IMPLANT
GAUZE SPONGE 4X4 12PLY STRL (GAUZE/BANDAGES/DRESSINGS) ×1 IMPLANT
GAUZE XEROFORM 1X8 LF (GAUZE/BANDAGES/DRESSINGS) ×1 IMPLANT
GLOVE BIO SURGEON STRL SZ7.5 (GLOVE) ×1 IMPLANT
GLOVE BIOGEL PI IND STRL 8 (GLOVE) ×1 IMPLANT
GLOVE BIOGEL PI IND STRL 8.5 (GLOVE) IMPLANT
GLOVE PI ORTHO PRO STRL SZ8 (GLOVE) IMPLANT
GLOVE SURG ORTHO 8.0 STRL STRW (GLOVE) IMPLANT
GOWN STRL REUS W/ TWL LRG LVL3 (GOWN DISPOSABLE) ×1 IMPLANT
GOWN STRL REUS W/ TWL XL LVL3 (GOWN DISPOSABLE) ×1 IMPLANT
KIT BASIN OR (CUSTOM PROCEDURE TRAY) ×1 IMPLANT
KIT TURNOVER KIT B (KITS) ×1 IMPLANT
LOOP VASCLR MAXI BLUE 18IN ST (MISCELLANEOUS) IMPLANT
MANIFOLD NEPTUNE II (INSTRUMENTS) IMPLANT
NDL HYPO 25X1 1.5 SAFETY (NEEDLE) IMPLANT
NEEDLE HYPO 25X1 1.5 SAFETY (NEEDLE) IMPLANT
NS IRRIG 1000ML POUR BTL (IV SOLUTION) ×1 IMPLANT
PACK ORTHO EXTREMITY (CUSTOM PROCEDURE TRAY) ×1 IMPLANT
PAD ARMBOARD POSITIONER FOAM (MISCELLANEOUS) ×2 IMPLANT
PAD CAST 3X4 CTTN HI CHSV (CAST SUPPLIES) IMPLANT
SET CYSTO W/LG BORE CLAMP LF (SET/KITS/TRAYS/PACK) IMPLANT
SOL PREP POV-IOD 4OZ 10% (MISCELLANEOUS) ×2 IMPLANT
SPIKE FLUID TRANSFER (MISCELLANEOUS) ×1 IMPLANT
SPONGE T-LAP 4X18 ~~LOC~~+RFID (SPONGE) ×1 IMPLANT
SUT ETHILON 4 0 P 3 18 (SUTURE) IMPLANT
SUT ETHILON 4 0 PS 2 18 (SUTURE) IMPLANT
SUT MON AB 5-0 P3 18 (SUTURE) IMPLANT
SWAB COLLECTION DEVICE MRSA (MISCELLANEOUS) IMPLANT
SWAB CULTURE ESWAB REG 1ML (MISCELLANEOUS) IMPLANT
SYR 20ML LL LF (SYRINGE) ×1 IMPLANT
SYR CONTROL 10ML LL (SYRINGE) IMPLANT
SYRINGE ORAL 60ML ENFIT (SYRINGE) IMPLANT
TOWEL GREEN STERILE (TOWEL DISPOSABLE) ×1 IMPLANT
TUBE CONNECTING 12X1/4 (SUCTIONS) ×1 IMPLANT
TUBE NG 5FR 35IN ENFIT (TUBING) IMPLANT
UNDERPAD 30X36 HEAVY ABSORB (UNDERPADS AND DIAPERS) ×1 IMPLANT
YANKAUER SUCT BULB TIP NO VENT (SUCTIONS) ×1 IMPLANT

## 2023-10-06 NOTE — Op Note (Signed)
 NAME: Alexandra Floyd MEDICAL RECORD NO: [id number] DATE OF BIRTH: August 14, 1965 FACILITY: [facility] LOCATION: [facility] OR PHYSICIAN: Saajan Willmon R. Loisann Roach, MD   OPERATIVE REPORT   DATE OF PROCEDURE: 10/06/23    PREOPERATIVE DIAGNOSIS: Right thumb flexor sheath infection and thenar abscess   POSTOPERATIVE DIAGNOSIS: Right thumb flexor sheath infection thenar abscess   PROCEDURE: Incision and drainage right thumb flexor sheath infection into thenar abscess   SURGEON:  Franky Curia, M.D.   ASSISTANT: none   ANESTHESIA:  General   INTRAVENOUS FLUIDS:  Per anesthesia flow sheet.   ESTIMATED BLOOD LOSS:  Minimal.   COMPLICATIONS:  None.   SPECIMENS: Cultures to micro   TOURNIQUET TIME:   Right arm: 46 minutes at 250 mmHg   DISPOSITION:  Stable to PACU.   INDICATIONS: 58 year old female states over the past day she has had increased swelling pain and erythema of the right thenar eminence and thumb.  She thinks she got a wound on it within the past few days.  Subjective fevers at home.  She wishes to proceed with incision and drainage in the operating room.  Risks, benefits and alternatives of surgery were discussed including the risks of blood loss, infection, damage to nerves, vessels, tendons, ligaments, bone for surgery, need for additional surgery, complications with wound healing, continued pain, stiffness, , need for repeat irrigation and debridement.  She voiced understanding of these risks and elected to proceed.  OPERATIVE COURSE:  After being identified preoperatively by myself,  the patient and I agreed on the procedure and site of the procedure.  The surgical site was marked.  Surgical consent had been signed.  She has been given IV antibiotics in the emergency department.  She was transferred to the operating room and placed on the operating table in supine position with the Right upper extremity on an arm board.  General anesthesia was induced by the anesthesiologist.  Right upper  extremity was prepped and draped in normal sterile orthopedic fashion.  A surgical pause was performed between the surgeons, anesthesia, and operating room staff and all were in agreement as to the patient, procedure, and site of procedure.  Tourniquet at the proximal aspect of the extremity was inflated to 250 mmHg after exsanguination of the arm with an Esmarch bandage.  Incision was made on the thenar eminence toward the ulnar side.  This was carried in subcutaneous tissues by spreading technique.  Cloudy fluid was encountered.  There was no gross purulence.  The fluid was cultured for aerobes and anaerobes.  The FPL tendon was displaced ulnarly.  This fluid communicated with the flexor sheath.  The cavity was spread.  It was able to be spread down into the first webspace.  An incision was made at the volar aspect of the distal phalanx and the flexor sheath accessed in this location.  A #5 pediatric feeding tube was threaded into the FPL tendon sheath from proximally.  This was used to irrigate the sheath with copious amounts of sterile saline..  Good effluent was obtained both proximally and distally.  The cavity in the thenar eminence was then irrigated with sterile saline by cystoscopy tubing.  The wounds were then packed with quarter inch iodoform gauze.  They were injected with quarter percent plain Marcaine  to aid in postoperative analgesia.  Wounds were dressed with sterile 4 x 4's and wrapped with a Kerlix bandage.  A thumb spica splint was placed and wrapped with Kerlix and Ace bandage.  The tourniquet was deflated at 46  minutes.  Fingertips were pink with brisk capillary refill after deflation of tourniquet.  The operative  drapes were broken down.  The patient was awoken from anesthesia safely.  She was transferred back to the stretcher and taken to PACU in stable condition.  She is admitted to the hospitalist for IV antibiotics.   Jamieon Lannen, MD Electronically signed, 10/06/23

## 2023-10-06 NOTE — ED Notes (Signed)
 pt. ate a snack bar at approx. 12 p.m. against request of staff nurse.

## 2023-10-06 NOTE — Hospital Course (Addendum)
 Principal Problem:   Cellulitis of right hand Active Problems:   Polysubstance abuse (HCC)   Hypothyroidism (acquired)   Alcohol use disorder, moderate, dependence (HCC)  Resolved Problems:   * No resolved hospital problems. *  Consults:***  Procedures:***  Follow-up items:***  58 year old presents with 2 days of worsening right hand pain, swelling, and redness, admitted for surgical management of right hand cellulitis.  Principal Problem:   Cellulitis of right hand Acute, no sepsis. No sign of necrotizing fasciitis. Can't rule out abscess, tenosynovitis, or septic arthritis. 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 Surgery taking to OR this afternoon, remain NPO. She has history of MRSA SSTI and ongoing risk factors for MRSA including drug use, so continue vancomycin . Admit to med-surg.  Active Problems:   Polysubstance abuse (HCC) Chronic, stable. Last used cocaine yesterday. No recent IV drug use. Risk factor for MRSA. Screen for HIV and hepatitis B.    Alcohol use disorder, moderate, dependence (HCC) Chronic, stable. Drinks 2 large beers or bottles of wine per day, last drink yesterday. No withdrawal at present. Start CIWA monitoring.    Chronic hepatitis C (HCC) Stable, without cirrhosis suspected. Should follow up outside of hospital for treatment.    Hypothyroidism (acquired) Chronic, stable. Check TSH. Resume outpatient levothyroxine .

## 2023-10-06 NOTE — Anesthesia Preprocedure Evaluation (Addendum)
 Anesthesia Evaluation  Patient identified by MRN, date of birth, ID band Patient awake    Reviewed: Allergy & Precautions, NPO status , Patient's Chart, lab work & pertinent test results  History of Anesthesia Complications Negative for: history of anesthetic complications  Airway Mallampati: III  TM Distance: >3 FB Neck ROM: Full    Dental  (+) Dental Advisory Given, Missing, Poor Dentition, Chipped,    Pulmonary neg shortness of breath, neg sleep apnea, neg COPD, neg recent URI, Current Smoker   breath sounds clear to auscultation       Cardiovascular negative cardio ROS  Rhythm:Regular     Neuro/Psych  PSYCHIATRIC DISORDERS Anxiety Depression    negative neurological ROS     GI/Hepatic ,GERD  Controlled,,(+)     substance abuse  alcohol use and cocaine use, Hepatitis -  Endo/Other  Hypothyroidism    Renal/GU      Musculoskeletal  RIGHT HAND INFECTION   Abdominal   Peds  Hematology   Anesthesia Other Findings The patient has a history of polysubstance use including cocaine (which is last used yesterday), methamphetamine, tobacco use (since 9th grade), and alcohol use (last used yesterday). She reports that her last use of intravenous drugs was 7 years ago. She denies any recent injection of substances in her affected hand.  Reproductive/Obstetrics                              Anesthesia Physical Anesthesia Plan  ASA: 3  Anesthesia Plan: General   Post-op Pain Management: Ofirmev  IV (intra-op)*, Toradol  IV (intra-op)* and Ketamine IV*   Induction: Intravenous  PONV Risk Score and Plan: 3 and Ondansetron , Dexamethasone  and Midazolam   Airway Management Planned: Oral ETT  Additional Equipment: None  Intra-op Plan:   Post-operative Plan: Extubation in OR  Informed Consent: I have reviewed the patients History and Physical, chart, labs and discussed the procedure including the  risks, benefits and alternatives for the proposed anesthesia with the patient or authorized representative who has indicated his/her understanding and acceptance.     Dental advisory given  Plan Discussed with: CRNA  Anesthesia Plan Comments:          Anesthesia Quick Evaluation

## 2023-10-06 NOTE — Progress Notes (Addendum)
 Postop note: Status post incision and drainage right thumb flexor sheath and thenar eminence.  Cloudy fluid encountered but no gross purulence.  Okay for DVT prophylaxis.  Okay for discharge home when afebrile, white blood count trending to normal, pain controlled.

## 2023-10-06 NOTE — ED Provider Notes (Signed)
 [place] EMERGENCY DEPARTMENT AT [facility] Provider Note   CSN: [id number] Arrival date & time: 10/06/23  9040     Patient presents with: Hand Injury   Alexandra Floyd is a 58 y.o. female.   Patient is a 58 year old female who presents to the emergency department with a chief complaint of pain to the palmar aspect of the right hand extending into the right first digit.  Symptoms have been ongoing for possibly past 2 days.  She admits to overlying erythema and warmth.  She notes the pain is worse with any attempted range of motion.  She notes that she does have a chronic deformity of the affected digit secondary to previous traumatic injury.  She does note that she has been having fevers at home.  She denies any numbness or paresthesias.   Hand Injury      Prior to Admission medications   Medication Sig Start Date End Date Taking? Authorizing Provider  cephALEXin  (KEFLEX ) 500 MG capsule Take 1 capsule (500 mg total) by mouth 4 (four) times daily. 06/01/23   Franklyn Sid SAILOR, MD  chlorhexidine  (PERIDEX ) 0.12 % solution Use as directed 15 mLs in the mouth or throat 2 (two) times daily. 02/24/23   Stuart Vernell Norris, PA-C  ciprofloxacin  (CIPRO ) 500 MG tablet Take 1 tablet (500 mg total) by mouth 2 (two) times daily. 09/20/23   Leath-Warren, Etta PARAS, NP  lidocaine  (XYLOCAINE ) 2 % solution Use as directed 10 mLs in the mouth or throat every 3 (three) hours as needed. 02/24/23   Stuart Vernell Norris, PA-C  naproxen  (NAPROSYN ) 500 MG tablet Take 1 tablet (500 mg total) by mouth 2 (two) times daily. 08/14/23   Daralene Lonni BIRCH, PA-C    Allergies: Lasix [furosemide]    Review of Systems  Musculoskeletal:        Pain to the right hand with swelling  All other systems reviewed and are negative.   Updated Vital Signs BP (!) 137/95 (BP Location: Left Arm)   Pulse 89   Temp 98.8 F (37.1 C) (Oral)   Resp 19   Ht 5' 3 (1.6 m)   Wt 77.1 kg   SpO2 97%   BMI 30.11  kg/m   Physical Exam Vitals and nursing note reviewed.  Constitutional:      Appearance: Normal appearance.  HENT:     Head: Normocephalic and atraumatic.  Eyes:     Extraocular Movements: Extraocular movements intact.     Conjunctiva/sclera: Conjunctivae normal.     Pupils: Pupils are equal, round, and reactive to light.  Cardiovascular:     Rate and Rhythm: Normal rate and regular rhythm.     Pulses: Normal pulses.     Heart sounds: Normal heart sounds.  Pulmonary:     Effort: Pulmonary effort is normal.     Breath sounds: Normal breath sounds.  Musculoskeletal:        General: Normal range of motion.     Comments: Tender to palpation noted over the palmar aspect of the right hand just over the thenar eminence, erythema and edema noted over this area with extension into the right first digit, cap refill less than 2 seconds distally, sensation intact distally, mild erythema extending into the forearm, nontender palpation of remainder of distal digits, radial pulse 2+ of extremities, no active discharge, chronic deformity noted to right thumb  Skin:    General: Skin is warm and dry.  Neurological:     General: No focal deficit  present.     Mental Status: She is alert and oriented to person, place, and time. Mental status is at baseline.  Psychiatric:        Mood and Affect: Mood normal.        Behavior: Behavior normal.        Thought Content: Thought content normal.        Judgment: Judgment normal.     (all labs ordered are listed, but only abnormal results are displayed) Labs Reviewed  COMPREHENSIVE METABOLIC PANEL WITH GFR - Abnormal; Notable for the following components:      Result Value   Sodium 134 (*)    CO2 20 (*)    Glucose, Bld 112 (*)    Calcium 8.7 (*)    All other components within normal limits  SEDIMENTATION RATE - Abnormal; Notable for the following components:   Sed Rate 40 (*)    All other components within normal limits  LACTIC ACID, PLASMA  CBC  WITH DIFFERENTIAL/PLATELET  URINALYSIS, W/ REFLEX TO CULTURE (INFECTION SUSPECTED)  C-REACTIVE PROTEIN    EKG: None  Radiology: DG Hand Complete Right Result Date: 10/06/2023 CLINICAL DATA:  Right hand pain and swelling. EXAM: RIGHT HAND - COMPLETE 3+ VIEW COMPARISON:  January 12, 2023. FINDINGS: No definite fracture or dislocation is noted. Severe osteoarthritis of first carpometacarpal joint is noted. Moderate degenerative changes are seen involving the second and third proximal interphalangeal joints. No definite soft tissue abnormality. IMPRESSION: Severe osteoarthritis of first carpometacarpal joint. Moderate degenerative changes are seen involving the second and third proximal interphalangeal joints. No acute abnormality seen. Electronically Signed   By: Lynwood Landy Raddle M.D.   On: 10/06/2023 10:41     Procedures   Medications Ordered in the ED  vancomycin  (VANCOREADY) IVPB 1500 mg/300 mL (1,500 mg Intravenous New Bag/Given 10/06/23 1218)  morphine  (PF) 4 MG/ML injection 4 mg (4 mg Intravenous Given 10/06/23 1045)  ondansetron  (ZOFRAN ) injection 4 mg (4 mg Intravenous Given 10/06/23 1045)                                    Medical Decision Making Amount and/or Complexity of Data Reviewed Labs: ordered. Radiology: ordered.  Risk Prescription drug management.   This patient presents to the ED for concern of pain and swelling to right hand, this involves an extensive number of treatment options, and is a complaint that carries with it a high risk of complications and morbidity.  The differential diagnosis includes cellulitis, abscess, flexor tenosynovitis, necrotizing fasciitis, gout   Co morbidities that complicate the patient evaluation  Polysubstance abuse, previous MRSA infection   Additional history obtained:  Additional history obtained from medical records External records from outside source obtained and reviewed including medical records   Lab Tests:  I Ordered,  and personally interpreted labs.  The pertinent results include:  No leukocytosis, no anemia, normal kidney function liver function, mild hyponatremia, normal lactic acid, elevated sed rate   Imaging Studies ordered:  I ordered imaging studies including x-ray of the right hand I independently visualized and interpreted imaging which showed no acute osseous injury or lesions I agree with the radiologist interpretation    Consultations Obtained:  I requested consultation with the orthopedic hand, Dr. Murrell,  and discussed lab and imaging findings as well as pertinent plan - they recommend: ER to ER transfer, n.p.o., Ortho consult   Problem List / ED Course /  Critical interventions / Medication management  Patient is doing well at this time and does remain stable.  She has been started on vancomycin  at this point.  I do have concern for abscess ovation over the thenar eminence and possible flexor tenosynovitis.  X-ray was unremarkable.  Blood work was unremarkable as well.  She has stable vital signs with no indication for sepsis at this point.  Have discussed patient case with Dr. Murrell with orthopedic hand who did recommend ER to ER transfer for further evaluation.  Patient has been made NPO.  Have discussed patient case with Dr. Jakie in the [facility] emergency department who has excepted for transfer I ordered medication including morphine , Zofran , vancomycin  for cellulitis, possible abscess, possible flexor tenosynovitis Reevaluation of the patient after these medicines showed that the patient improved I have reviewed the patients home medicines and have made adjustments as needed   Social Determinants of Health:  None   Test / Admission - Considered:  Transfer for orthopedic hand     Final diagnoses:  Cellulitis of right hand    ED Discharge Orders     None          Daralene Lonni JONETTA DEVONNA 10/06/23 1239    Suzette Pac, MD 10/07/23 1644

## 2023-10-06 NOTE — ED Triage Notes (Addendum)
 Pt BIB [facility] from [facility] for R hand swelling and wounds due to polysubstance abuse. VSS. A&O x4. I mg of Dilaudid  given at [facility] prior to transfer. Vancomycin  currently infusing, began at [facility].

## 2023-10-06 NOTE — ED Triage Notes (Signed)
 Pt reports right hand pain and swelling. Pt's hand is also red and hot. Pt is in severe pain and stated that she noticed it yesterday. No open wound on hand

## 2023-10-06 NOTE — Progress Notes (Signed)
 Pharmacy Antibiotic Note  Alexandra Floyd is a 58 y.o. female admitted on 10/06/2023 with cellulitis.  Pharmacy has been consulted for Vancomycin  dosing.  Plan: Vancomycin  750 mg Q12H  Pt Scr at baseline (~0.8). Continue to monitor.   Height: 5' 3 (160 cm) Weight: 77.1 kg (170 lb) IBW/kg (Calculated) : 52.4  Temp (24hrs), Avg:98.4 F (36.9 C), Min:97.9 F (36.6 C), Max:98.8 F (37.1 C)  Recent Labs  Lab 10/06/23 1036  WBC 10.4  CREATININE 0.76  LATICACIDVEN 1.0    Estimated Creatinine Clearance: 76.3 mL/min (by C-G formula based on SCr of 0.76 mg/dL).    Allergies  Allergen Reactions   Lasix [Furosemide] Rash    Antimicrobials this admission: 9/5 Vancomycin     Dose adjustments this admission: None  Thank you for allowing pharmacy to be a part of this patient's care.  Prentice DOROTHA Favors, PharmD PGY1 Health-System Pharmacy Administration and Leadership Resident [facility]  10/06/2023 4:57 PM

## 2023-10-06 NOTE — ED Notes (Signed)
 antibiotics delayed due to access; 3 nurses attempted unsuccessfully; swot nurse called for US  IV.

## 2023-10-06 NOTE — Anesthesia Procedure Notes (Signed)
 Procedure Name: Intubation Date/Time: 10/06/2023 7:53 PM  Performed by: Boyce Shilling, CRNAPre-anesthesia Checklist: Patient identified, Emergency Drugs available, Suction available, Timeout performed and Patient being monitored Patient Re-evaluated:Patient Re-evaluated prior to induction Oxygen Delivery Method: Circle system utilized Preoxygenation: Pre-oxygenation with 100% oxygen Induction Type: IV induction Ventilation: Mask ventilation without difficulty Laryngoscope Size: Mac and 3 Grade View: Grade I Tube type: Oral Tube size: 7.0 mm Number of attempts: 1 Airway Equipment and Method: Stylet Placement Confirmation: ETT inserted through vocal cords under direct vision, positive ETCO2, CO2 detector and breath sounds checked- equal and bilateral Secured at: 22 cm Tube secured with: Tape Dental Injury: Teeth and Oropharynx as per pre-operative assessment

## 2023-10-06 NOTE — H&P (Signed)
 Date: 10/06/2023               Patient Name:  Alexandra Floyd MRN: [id number]  DOB: [birth year] Age / Sex: 58 y.o., female   PCP: Patient, No Pcp Per         Medical Service: Internal Medicine Teaching Service         Attending Physician: Dr. Mliss Pouch      First Contact: Schuyler Novak, DO    Second Contact: Dr. Ozell Kung, MD          Pager Information: First Contact Pager: [phone]   Second Contact Pager: [phone]   SUBJECTIVE   Chief Complaint: Right hand swelling and warmth  History of Present Illness: Alexandra Floyd is a 58 y.o. female with PMH of recurrent infection, chronic hepatitis B and C, and polysubstance use disorder who presented with hand swelling and warmth. The patient reports that 2 days ago she was working in the yard when she sustained a minor laceration on the thenar aspect of her right hand. She said she initially noticed burning pain in her hand which progressed to swelling, redness, and warmth of the thenar aspect of her right hand sparing the thumb. Patient can move her fingers with no increase in pain. The swelling has progressed to the distal aspect of her forehand. Associating symptoms include fever and nausea. The patient report a series of infections in her left hand, which is similar to what she is experiencing in her right hand, left leg, ears, face, and dental.    The patient has a history of polysubstance use including cocaine (which is last used yesterday), methamphetamine, tobacco use (since 9th grade), and alcohol use (last used yesterday). She reports that her last use of intravenous drugs was 7 years ago. She denies any recent injection of substances in her affected hand.        ED Course: Labs significant for CRP 4.5, ESR 40,  Imaging Severe osteoarthritis of first carpometacarpal joint. Moderate degenerative changes are seen involving the second and third proximal interphalangeal joints. No acute abnormality seen. Received Dilaudid ,  morphine , zofran , vancomycin ,  Consulted Orthopedic surgery , IMTS  Meds:  Levothyroxine  25mcg  Current Meds  Medication Sig   levothyroxine  (SYNTHROID ) 25 MCG tablet Take 25 mcg by mouth daily before breakfast.   PRESCRIPTION MEDICATION Take 1 tablet by mouth every 8 (eight) hours as needed (pain). *Not prescribed to patient* Oxycodone /APAP  5mg /325mg     Past Medical History Recurrent infections Hepatitis C Polysubstance use disorder Hypothyroidism  Past Surgical History Past Surgical History:  Procedure Laterality Date   arm sx     CHOLECYSTECTOMY       Social:  Lives With: Occupation: Support: Level of Function: PCP:  Patient, No Pcp Per  Substances: -Tobacco: 1 pack per day since 58yo -Alcohol: Frequently, unable to quantify -Recreational Drug: Cocaine, methamphetamine  Family History:  Family History  Problem Relation Age of Onset   Hypertension Mother      Allergies: Allergies as of 10/06/2023 - Review Complete 10/06/2023  Allergen Reaction Noted   Lasix [furosemide] Rash 04/08/2018    Review of Systems: A complete ROS was negative except as per HPI.   OBJECTIVE:   Physical Exam: Blood pressure (!) 102/92, pulse 81, temperature 97.9 F (36.6 C), temperature source Oral, resp. rate 20, height 5' 3 (1.6 m), weight 77.1 kg, SpO2 98%.  Constitutional: well-appearing female, in no acute distress HENT: normocephalic atraumatic, mucous membranes moist Neck: supple Cardiovascular: regular rate and  rhythm, no m/r/g Pulmonary/Chest: normal work of breathing on room air, lungs clear to auscultation bilaterally Abdominal: soft, non-tender, non-distended MSK: normal bulk and tone. Right hand with erythema and edema with fluctuation or pain with AROM/PROM. Significant joint malformation noted.  Skin: warm and dry with scabs noted throughout limbs and face Psych: anxious demeanor with pressured speech   Labs: CBC    Component Value Date/Time   WBC 10.4  10/06/2023 1036   RBC 3.96 10/06/2023 1036   HGB 12.6 10/06/2023 1036   HCT 37.6 10/06/2023 1036   PLT 323 10/06/2023 1036   MCV 94.9 10/06/2023 1036   MCH 31.8 10/06/2023 1036   MCHC 33.5 10/06/2023 1036   RDW 14.1 10/06/2023 1036   LYMPHSABS 1.7 10/06/2023 1036   MONOABS 1.0 10/06/2023 1036   EOSABS 0.0 10/06/2023 1036   BASOSABS 0.1 10/06/2023 1036     CMP     Component Value Date/Time   NA 134 (L) 10/06/2023 1036   K 3.6 10/06/2023 1036   CL 102 10/06/2023 1036   CO2 20 (L) 10/06/2023 1036   GLUCOSE 112 (H) 10/06/2023 1036   BUN 7 10/06/2023 1036   CREATININE 0.76 10/06/2023 1036   CALCIUM 8.7 (L) 10/06/2023 1036   PROT 7.9 10/06/2023 1036   ALBUMIN 3.5 10/06/2023 1036   AST 24 10/06/2023 1036   ALT 23 10/06/2023 1036   ALKPHOS 81 10/06/2023 1036   BILITOT 0.8 10/06/2023 1036   GFRNONAA >60 10/06/2023 1036   GFRAA >60 09/19/2019 2323    Imaging: DG Hand Complete Right Result Date: 10/06/2023 CLINICAL DATA:  Right hand pain and swelling. EXAM: RIGHT HAND - COMPLETE 3+ VIEW COMPARISON:  January 12, 2023. FINDINGS: No definite fracture or dislocation is noted. Severe osteoarthritis of first carpometacarpal joint is noted. Moderate degenerative changes are seen involving the second and third proximal interphalangeal joints. No definite soft tissue abnormality. IMPRESSION: Severe osteoarthritis of first carpometacarpal joint. Moderate degenerative changes are seen involving the second and third proximal interphalangeal joints. No acute abnormality seen. Electronically Signed   By: Lynwood Landy Raddle M.D.   On: 10/06/2023 10:41     EKG: personally reviewed my interpretation is NSR.   ASSESSMENT & PLAN:   Assessment & Plan by Problem: Principal Problem:   Cellulitis of right hand Active Problems:   Polysubstance abuse (HCC)   Hypothyroidism (acquired)   Alcohol use disorder, moderate, dependence (HCC)   Chronic hepatitis C (HCC)   Alexandra Floyd is a 58 y.o. person  living with a history of chronic hepatitis C, polysubstance abuse, and hypothyroidism who presented with right hand swelling and pain and admitted for RUE cellulits on hospital day 0  #Right Upper Extremity Cellulitis  #Recurrent Infections Patient has a 2 day history of hand tenderness, erythema, swelling, and warmth associated with fever and nausea. On exam, patient was well appearing, right hand erythema, swelling, and warmth at the thenar region. No induration or fluctuance. No pain on passive and active movement of her digits. Labs were remarkable for a Sed rate of 40.    The patient appears stable with no signs of sepsis. Suspect this is cellulitis due to pattern and appearance. Low suspicion for abscess or tenosynovitis, however cannot rule out at this time. Orthopedic surgery consulted and will proceed with I&D this evening. Will continue vancomycin  for MRSA coverage given hx of MRSA infections in the past and active risk factors including recurrent infections with frequent abx treatment and IVDU.   #Polysubstance abuse  The patient has a history of cocaine, methamphetamine, tobacco, and alcohol use. She used cocaine and alcohol yesterday and last reported methamphetamine use was six months ago. The patient did not appear to have withdrawal symptoms on assessment, however at high risk.  -CIWA protocol.  -Screen for HIV  #Hepatitis C -The patient has a long known history of IVDU. She did report a chronic hepatitis infection which has never been treated. This was confirmed by chart review -Hepatitis C positive -Screen for Hep B -At this time, the patient is not interested in receiving treatment.   #Hypothyroidism -Last TSH 1 year ago 2.6 -Continue home levothyroxine  25mg  daily  Best practice: Diet: Normal VTE: None until post surgery IVF: None,None Code: Full  Disposition planning: Prior to Admission Living Arrangement: Home, living with boyfriend and daughter Anticipated  Discharge Location: Home  Dispo: Admit patient to Observation with expected length of stay less than 2 midnights.  Signed: Myrna Bitters, DO Internal Medicine Resident  10/06/2023, 6:38 PM  On Call pager: [phone]

## 2023-10-06 NOTE — ED Provider Notes (Signed)
  Physical Exam  BP (!) 102/92   Pulse 81   Temp 97.9 F (36.6 C) (Oral)   Resp 20   Ht 5' 3 (1.6 m)   Wt 77.1 kg   SpO2 98%   BMI 30.11 kg/m   Physical Exam Vitals and nursing note reviewed.  Constitutional:      General: She is not in acute distress.    Appearance: Normal appearance. She is not ill-appearing.  Eyes:     Extraocular Movements: Extraocular movements intact.     Conjunctiva/sclera: Conjunctivae normal.  Cardiovascular:     Rate and Rhythm: Normal rate.  Pulmonary:     Effort: Pulmonary effort is normal. No respiratory distress.  Neurological:     General: No focal deficit present.     Mental Status: She is alert. Mental status is at baseline.     Gait: Gait normal.     Procedures  .Foreign Body Removal  Date/Time: 10/06/2023 4:20 PM  Performed by: Beola Terrall RAMAN, PA-C Authorized by: Beola Terrall RAMAN, PA-C  Consent: Verbal consent obtained Consent given by: patient Patient understanding: patient states understanding of the procedure being performed Patient consent: the patient's understanding of the procedure matches consent given Relevant documents: relevant documents present and verified Patient identity confirmed: verbally with patient Body area: skin General location: upper extremity Location details: right ring finger  Sedation: Patient sedated: no  Patient restrained: no Patient cooperative: yes 1 objects recovered. Objects recovered: Ring Post-procedure assessment: foreign body removed Patient tolerance: patient tolerated the procedure well with no immediate complications    ED Course / MDM    Medical Decision Making Amount and/or Complexity of Data Reviewed Labs: ordered. Radiology: ordered.  Risk Prescription drug management. Decision regarding hospitalization.   Removed ring from right ring finger.  Patient was then moved to new room awaiting surgery.       Beola Terrall RAMAN, PA-C 10/06/23 1621    Elnor Jayson LABOR,  DO 10/09/23 [phone]

## 2023-10-06 NOTE — ED Notes (Signed)
 [facility] has been called for transport: Spoke with [facility].

## 2023-10-06 NOTE — Transfer of Care (Signed)
 Immediate Anesthesia Transfer of Care Note  Patient: Alexandra Floyd  Procedure(s) Performed: IRRIGATION AND DEBRIDEMENT WOUND (Right: Hand)  Patient Location: PACU  Anesthesia Type:General  Level of Consciousness: awake, alert , and oriented  Airway & Oxygen Therapy: Patient Spontanous Breathing and Patient connected to face mask oxygen  Post-op Assessment: Report given to RN and Post -op Vital signs reviewed and stable  Post vital signs: Reviewed and stable  Last Vitals:  Vitals Value Taken Time  BP 119/79 10/06/23 21:09  Temp 37.3 C 10/06/23 21:09  Pulse 94 10/06/23 21:14  Resp 17 10/06/23 21:14  SpO2 93 % 10/06/23 21:14  Vitals shown include unfiled device data.  Last Pain:  Vitals:   10/06/23 2109  TempSrc:   PainSc: Asleep      Patients Stated Pain Goal: 3 (10/06/23 1811)  Complications: No notable events documented.

## 2023-10-06 NOTE — Discharge Instructions (Addendum)
 Thank you for allowing us  to be part of your care. You were hospitalized for an infection of your right hand. We treated you with surgery and antibiotics  See the changes in your medications and management of your chronic conditions below:  *For your infection -We have STARTED you on these following medications:  -linezolid  (an antibiotic)--Take 1 tablet every 12 hours for 10days   -Remember to finish all of your antibiotic even if you are feeling better.   -You may take tylenol  and ibuprofen  as needed for pain.    **Please see the instructions from the surgeon below:  FOLLOW UP APPOINTMENTS: Please visit the surgeon (Dr. Murrell doctor) in 7-14 days. Call them to schedule your appointment: [facility] [phone]  Please make sure to take your antibiotics and make your follow up visits  Please call your PCP or our clinic if you have any questions or concerns, we may be able to help and keep you from a long and expensive emergency room wait. Our clinic and after hours phone number is [phone]. The best time to call is Monday through Friday 9 am to 4 pm but there is always someone available 24/7 if you have an emergency. If you need medication refills please notify your pharmacy one week in advance and they will send us  a request.   We are glad you are feeling better,  [facility] Inpatient Teaching Service at [facility] Instructions Hand Surgery  Wound Care: Keep your hand elevated above the level of your heart.  Do not allow it to dangle by your side.  Keep the dressing dry and do not remove it unless your doctor advises you to do so.  He will usually change it at the time of your post-op visit.  Moving your fingers is advised to stimulate circulation but will depend on the site of your surgery.  If you have a splint applied, your doctor will advise you regarding movement.  Activity: Do not drive or  operate machinery today.  Rest today and then you may return to your normal activity and work as indicated by your physician.  Diet:  Drink liquids today or eat a light diet.  You may resume a regular diet tomorrow.    General expectations: Pain for two to three days. Fingers may become slightly swollen.  Call your doctor if any of the following occur: Severe pain not relieved by pain medication. Elevated temperature. Dressing soaked with blood. Inability to move fingers. White or bluish color to fingers.   Providers Accepting New Patients in [place], [place]    [facility] [address]  [place], [place] [address] [phone] Accepts most insurances  [facility] [address] [place], [place] [address] [phone] Accepts most insurances  [facility] [address] [place], [place] [address]  [phone] Must meet requirements  [facility] [address] [place], [place] [address] [phone] Accepts most insurances  [facility] [address]  [place], [place] [address] [phone] Accepts most insurances  [facility] [address]   [place], [place]   [phone] Accepts most insurances  [facility])  [id number]  [place], [place] [address] [phone] Accepts most insurances     [facility] [address]  [place], [place] [address] [phone] Accepts  most insurances  [facility] [address] [place], [place] [address] [phone] option 1 Accepts Medicaid and [facility] [address]  [place], [place] [address] [phone] Accepts most insurances  Benita Outhouse, MD [address] [place], [place] [address] [phone] Accepts most insurances  [facility] [address]  [place], [place]  [address] [phone] Accepts most insurances  [facility] [phone] [address] [place], [place] [address] [phone] Accepts most insurances  [facility] [place] [address], [address] [place], [place] [address] [phone]  Accepts most insurances

## 2023-10-06 NOTE — Consult Note (Addendum)
 Reason for Consult:Right hand infection Referring Physician: Elsie Body Time called: 1450 Time at bedside: 1500   Alexandra Floyd is an 58 y.o. female.  HPI: Elane had been working in the yard this week and thinks she had some hand wounds that led to severe pain and swelling of the right hand that happened overnight. Today she's had subjective fevers, chills, sweats, and nausea. She was seen at [facility] and transferred to [facility] for hand surgery evaluation. She works taking care of her disabled husband and son.  Past Medical History:  Diagnosis Date   Anxiety    Cellulitis    Depression    Heart murmur    Hep C w/ coma, chronic    MRSA cellulitis     Past Surgical History:  Procedure Laterality Date   arm sx     CHOLECYSTECTOMY      Family History  Problem Relation Age of Onset   Hypertension Mother     Social History:  reports that she has been smoking cigarettes. She started smoking about 47 years ago. She has a 47.7 pack-year smoking history. She has never used smokeless tobacco. She reports that she does not currently use alcohol. She reports that she does not currently use drugs after having used the following drugs: Methamphetamines, Crack cocaine, and Heroin.  Allergies:  Allergies  Allergen Reactions   Lasix [Furosemide] Rash    Medications: I have reviewed the patient's current medications.  Results for orders placed or performed during the hospital encounter of 10/06/23 (from the past 48 hours)  Lactic acid, plasma     Status: None   Collection Time: 10/06/23 10:36 AM  Result Value Ref Range   Lactic Acid, Venous 1.0 0.5 - 1.9 mmol/L    Comment: Performed at [facility], [address] [place], [place] [address]  Comprehensive metabolic panel     Status: Abnormal   Collection Time: 10/06/23 10:36 AM  Result Value Ref Range   Sodium 134 (L) 135 - 145 mmol/L   Potassium 3.6 3.5 - 5.1 mmol/L   Chloride 102 98 - 111 mmol/L   CO2 20 (L) 22 - 32 mmol/L    Glucose, Bld 112 (H) 70 - 99 mg/dL    Comment: Glucose reference range applies only to samples taken after fasting for at least 8 hours.   BUN 7 6 - 20 mg/dL   Creatinine, Ser 9.23 0.44 - 1.00 mg/dL   Calcium 8.7 (L) 8.9 - 10.3 mg/dL   Total Protein 7.9 6.5 - 8.1 g/dL   Albumin 3.5 3.5 - 5.0 g/dL   AST 24 15 - 41 U/L   ALT 23 0 - 44 U/L   Alkaline Phosphatase 81 38 - 126 U/L   Total Bilirubin 0.8 0.0 - 1.2 mg/dL   GFR, Estimated >39 >39 mL/min    Comment: (NOTE) Calculated using the CKD-EPI Creatinine Equation (2021)    Anion gap 12 5 - 15    Comment: Performed at [facility], [address], [place], [place] [address]  CBC with Differential     Status: None   Collection Time: 10/06/23 10:36 AM  Result Value Ref Range   WBC 10.4 4.0 - 10.5 K/uL   RBC 3.96 3.87 - 5.11 MIL/uL   Hemoglobin 12.6 12.0 - 15.0 g/dL   HCT 62.3 63.9 - 53.9 %   MCV 94.9 80.0 - 100.0 fL   MCH 31.8 26.0 - 34.0 pg   MCHC 33.5 30.0 - 36.0 g/dL   RDW  14.1 11.5 - 15.5 %   Platelets 323 150 - 400 K/uL   nRBC 0.0 0.0 - 0.2 %   Neutrophils Relative % 73 %   Neutro Abs 7.6 1.7 - 7.7 K/uL   Lymphocytes Relative 16 %   Lymphs Abs 1.7 0.7 - 4.0 K/uL   Monocytes Relative 10 %   Monocytes Absolute 1.0 0.1 - 1.0 K/uL   Eosinophils Relative 0 %   Eosinophils Absolute 0.0 0.0 - 0.5 K/uL   Basophils Relative 1 %   Basophils Absolute 0.1 0.0 - 0.1 K/uL   Immature Granulocytes 0 %   Abs Immature Granulocytes 0.03 0.00 - 0.07 K/uL    Comment: Performed at [facility], [address] [place], [place] [address]  Sedimentation rate     Status: Abnormal   Collection Time: 10/06/23 10:36 AM  Result Value Ref Range   Sed Rate 40 (H) 0 - 30 mm/hr    Comment: Performed at [facility], [address] [place], [place] [address]  Urinalysis, w/ Reflex to Culture (Infection Suspected) -Urine, Clean Catch     Status: Abnormal   Collection Time: 10/06/23  2:13 PM  Result Value Ref Range   Specimen Source URINE, CLEAN CATCH     Color, Urine YELLOW YELLOW   APPearance CLEAR CLEAR   Specific Gravity, Urine 1.014 1.005 - 1.030   pH 6.0 5.0 - 8.0   Glucose, UA NEGATIVE NEGATIVE mg/dL   Hgb urine dipstick SMALL (A) NEGATIVE   Bilirubin Urine NEGATIVE NEGATIVE   Ketones, ur NEGATIVE NEGATIVE mg/dL   Protein, ur NEGATIVE NEGATIVE mg/dL   Nitrite NEGATIVE NEGATIVE   Leukocytes,Ua NEGATIVE NEGATIVE   RBC / HPF 0-5 0 - 5 RBC/hpf   WBC, UA 0-5 0 - 5 WBC/hpf    Comment:        Reflex urine culture not performed if WBC <=10, OR if Squamous epithelial cells >5. If Squamous epithelial cells >5 suggest recollection.    Bacteria, UA NONE SEEN NONE SEEN   Squamous Epithelial / HPF 0-5 0 - 5 /HPF   Mucus PRESENT     Comment: Performed at [facility], [address], [place], [place] [address]    DG Hand Complete Right Result Date: 10/06/2023 CLINICAL DATA:  Right hand pain and swelling. EXAM: RIGHT HAND - COMPLETE 3+ VIEW COMPARISON:  January 12, 2023. FINDINGS: No definite fracture or dislocation is noted. Severe osteoarthritis of first carpometacarpal joint is noted. Moderate degenerative changes are seen involving the second and third proximal interphalangeal joints. No definite soft tissue abnormality. IMPRESSION: Severe osteoarthritis of first carpometacarpal joint. Moderate degenerative changes are seen involving the second and third proximal interphalangeal joints. No acute abnormality seen. Electronically Signed   By: Lynwood Landy Raddle M.D.   On: 10/06/2023 10:41    Review of Systems  Constitutional:  Positive for chills and diaphoresis. Negative for fever.  HENT:  Negative for ear discharge, ear pain, hearing loss and tinnitus.   Eyes:  Negative for photophobia and pain.  Respiratory:  Negative for cough and shortness of breath.   Cardiovascular:  Negative for chest pain.  Gastrointestinal:  Positive for nausea. Negative for abdominal pain and vomiting.  Genitourinary:  Negative for dysuria, flank pain,  frequency and urgency.  Musculoskeletal:  Positive for arthralgias (Right hand). Negative for back pain, myalgias and neck pain.  Neurological:  Negative for dizziness and headaches.  Hematological:  Does not bruise/bleed easily.  Psychiatric/Behavioral:  The patient is not  nervous/anxious.    Blood pressure (!) 102/92, pulse 81, temperature 97.9 F (36.6 C), temperature source Oral, resp. rate 20, height 5' 3 (1.6 m), weight 77.1 kg, SpO2 98%. Physical Exam Constitutional:      General: She is not in acute distress.    Appearance: She is well-developed. She is not diaphoretic.  HENT:     Head: Normocephalic and atraumatic.  Eyes:     General: No scleral icterus.       Right eye: No discharge.        Left eye: No discharge.     Conjunctiva/sclera: Conjunctivae normal.  Cardiovascular:     Rate and Rhythm: Normal rate and regular rhythm.  Pulmonary:     Effort: Pulmonary effort is normal. No respiratory distress.  Musculoskeletal:     Cervical back: Normal range of motion.     Comments: Right shoulder, elbow, wrist, digits- no skin wounds, thenar eminence severe edema and erythema, small abrasion palmar side, no instability, no blocks to motion  Sens  Ax/R/M/U intact  Mot   Ax/ R/ PIN/ M/ AIN/ U intact  Rad 2+  Skin:    General: Skin is warm and dry.  Neurological:     Mental Status: She is alert.  Psychiatric:        Mood and Affect: Mood normal.        Behavior: Behavior normal.     Assessment/Plan: Right hand infection -- Plan I&D this afternoon by Dr. Murrell. Please keep NPO. Will need medical admission for IV abx.    Ozell DOROTHA Ned, PA-C Orthopedic Surgery [phone] 10/06/2023, 3:18 PM   Addendum (10/06/2023): Patient seen and examined.  Agree with above. History: 58 year old female states over the past day she has had increasing swelling pain and erythema in the palm of the right hand.  She does not remember specific injuries but thinks she may have had a  wound.  States she does not inject anything in the hand. Exam: Intact sensation capillary refill in the fingertips.  Tender to palpation in the thenar eminence and on the palmar surface of the thumb.  Nontender along the dorsum of the hand.  Nontender at the ulnar side of the hand.  Nontender at the wrist.  No proximal streaking.  The thumb is in a hyperextended position at the MP joint which she states is chronic. A/P: Right thenar eminence infection and possible flexor sheath infection.  Recommend incision and drainage in the operating room.  Risks, benefits and alternatives of surgery were discussed including risks of blood loss, infection, damage to nerves/vessels/tendons/ligament/bone, failure of surgery, need for additional surgery, complication with wound healing, stiffness, need for repeat irrigation and debridement.  She voiced understanding of these risks and elected to proceed.

## 2023-10-06 NOTE — ED Provider Notes (Signed)
 Patient was transferred from [facility], there patient was taken care of by Lonni Conger, PA-C, please see his note for further detail.  Briefly patient is a 58 year old female who presented to the emergency department due to a chief complaint of right hand pain.  She states that symptoms have been going on for approximately 2 days but that the affected area around her right thumb and index finger became significantly more swollen and red since last night.  She also appreciates that she has had fevers off and on at home. Patient does appreciate drug use.   Workup at [facility] was notable for unremarkable CBC with no elevated white blood cell count, normal lactic acid, elevated ESR at 40, xray of hand showed severe degenerative changes, hand was consulted and case was dicussed with Dr. Murrell who recommended ED to ED transfer for further evaluation and intervention. Patient given does of Vancomycin , morphine , and zofran  prior to transfer.   Patient transferred ED to ED to [facility] ER, case discussed with Ozell Kenner PA-C upon patient arrival who recommends medical admission and wash-out in the OR later today by Dr. Murrell.   When assessing patient, vital signs stable. Patient also mentioned chest pain, out of an abundance of caution EKG as well as troponin ordered. No acute ischemic changes or ST elevation on EKG per my interpretation.   Patient admitted to medicine, Dr. Mliss Pouch service with orthopedics consulting for ongoing diagnosis and treatment.    Thomasenia Dowse F, PA-C 10/06/23 2123    Francesca Elsie CROME, MD 10/10/23 [phone]

## 2023-10-07 ENCOUNTER — Other Ambulatory Visit (HOSPITAL_COMMUNITY): Payer: Self-pay

## 2023-10-07 DIAGNOSIS — L02511 Cutaneous abscess of right hand: Secondary | ICD-10-CM | POA: Insufficient documentation

## 2023-10-07 DIAGNOSIS — M6510 Other infective (teno)synovitis, unspecified site: Secondary | ICD-10-CM | POA: Insufficient documentation

## 2023-10-07 LAB — CBC
HCT: 34.5 % — ABNORMAL LOW (ref 36.0–46.0)
Hemoglobin: 11.5 g/dL — ABNORMAL LOW (ref 12.0–15.0)
MCH: 31.4 pg (ref 26.0–34.0)
MCHC: 33.3 g/dL (ref 30.0–36.0)
MCV: 94.3 fL (ref 80.0–100.0)
Platelets: 282 K/uL (ref 150–400)
RBC: 3.66 MIL/uL — ABNORMAL LOW (ref 3.87–5.11)
RDW: 13.9 % (ref 11.5–15.5)
WBC: 12.3 K/uL — ABNORMAL HIGH (ref 4.0–10.5)
nRBC: 0 % (ref 0.0–0.2)

## 2023-10-07 LAB — HEPATITIS B SURFACE ANTIBODY,QUALITATIVE: Hep B S Ab: REACTIVE — AB

## 2023-10-07 LAB — HIV ANTIBODY (ROUTINE TESTING W REFLEX): HIV Screen 4th Generation wRfx: NONREACTIVE

## 2023-10-07 LAB — TSH: TSH: 0.274 u[IU]/mL — ABNORMAL LOW (ref 0.350–4.500)

## 2023-10-07 LAB — HEPATITIS B SURFACE ANTIGEN: Hepatitis B Surface Ag: NONREACTIVE

## 2023-10-07 MED ORDER — ACETAMINOPHEN 500 MG PO TABS: 1000.0000 mg | ORAL_TABLET | Freq: Four times a day (QID) | ORAL | 0 refills | Status: DC

## 2023-10-07 MED ORDER — VITAMIN C 500 MG PO TABS
1000.0000 mg | ORAL_TABLET | Freq: Every day | ORAL | Status: DC
  Administered 2023-10-07: 1000 mg via ORAL
  Filled 2023-10-07: qty 2

## 2023-10-07 MED ORDER — ACETAMINOPHEN 500 MG PO TABS
1000.0000 mg | ORAL_TABLET | Freq: Four times a day (QID) | ORAL | 0 refills | Status: AC
  Filled 2023-10-07: qty 30, 4d supply, fill #0

## 2023-10-07 MED ORDER — LINEZOLID 600 MG PO TABS
600.0000 mg | ORAL_TABLET | Freq: Two times a day (BID) | ORAL | 0 refills | Status: AC
  Filled 2023-10-07: qty 20, 10d supply, fill #0

## 2023-10-07 MED ORDER — LINEZOLID 600 MG PO TABS
600.0000 mg | ORAL_TABLET | Freq: Two times a day (BID) | ORAL | 0 refills | Status: DC
Start: 2023-10-07 — End: 2023-10-07

## 2023-10-07 NOTE — Discharge Summary (Signed)
 Name: Alexandra Floyd MRN: [id number] DOB: [birth year] 58 y.o. PCP: Patient, No Pcp Per  Date of Admission: 10/06/2023 10:10 AM Date of Discharge: 10/07/2023 Attending Physician: Dr. Mliss Floyd  Discharge Diagnosis: 1. 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   levothyroxine  25 MCG tablet Commonly known as: SYNTHROID  Take 25 mcg by mouth daily before breakfast.   linezolid  600 MG tablet Commonly known as: ZYVOX  Take 1 tablet (600 mg total) by mouth 2 (two) times daily for 10 days.               Discharge Care Instructions  (From admission, onward)           Start     Ordered   10/07/23 0000  Discharge wound care:       Comments: Keep your hand elevated above the level of your heart.  Do not allow it to dangle by your side.  Keep the dressing dry and do not remove it unless your doctor advises you to do so.  He will usually change it at the time of your post-op visit.  Moving your fingers is advised to stimulate circulation but will depend on the site of your surgery.  If you have a splint applied, your doctor will advise you regarding movement.   10/07/23 1054            Disposition and follow-up:   Ms.Alexandra Floyd was discharged from [facility] in Stable condition.  At the hospital follow up visit please address:  RUE cellulitis--Ensure pt is taking her  linezolid  to completion (10 day course). Assess pain Ensure she called Alexandra Floyd office to schedule her follow up.   Pending labs: HIV  Follow-up Appointments:  Follow-up Information     Alexandra Drivers, MD. Call.   Specialty: Orthopedic Surgery Contact information: [address] [place] [address] [phone]                  Hospital Course by problem list: Alexandra Floyd is a 58 y.o. person living with a history of recurrent infection, chronic hepatitis C, and polysubstance use disorder who presented with RUE swelling and pain and admitted for RUE cellulitis  now being discharged on hospital day 1 with the following pertinent hospital course:  Cellulitis of the right hand Recurrent infections The patient presented to [facility], ER with a 2-day history of hand tenderness with erythema swelling and warmth.  She is also complaining of subjective fever and nausea.  At this time the orthopedic hand surgery was consulted and she was transferred over to [facility] for incision and drainage. She was not meeting sepsis criteria and did not have a fever or leukocytosis at time of presentation. Upon admission she was started on IV vancomycin  and underwent incision and debridement on the night  of 9/5.  During the operation it was found that she had right thumb flexor sheath infection with thenar abscess.  It was appropriately drained and irrigated.  A dressing was placed per orthopedic surgery.  On the morning of 9/6 she was evaluated where she had improving pain mobility in her hand.  She was transition to oral linezolid  at this time and deemed stable for discharge with close orthopedic follow-up.  Polysubstance abuse She does have a history of polysubstance abuse including crack cocaine and methamphetamines.  She states that she smokes these and has not injected a drug intravenously in the past 7 years.  She also uses alcohol frequently but was unable to quantify the amount.  Upon  admission she was placed on CIWA protocol to monitor for withdrawal symptoms.  Her CIWA scores consistently remain 0.  On 9/6 she was deemed stable for discharge  Hepatitis C Risk factors for HIV The patient reported history of chronic hepatitis C.  However she did state that she was not interested in undergoing treatment at this time.  She was tested for hepatitis B and was surface antigen negative.  HIV testing pending at time of discharge.  Stable chronic medical conditions: Hypothyroidism  Subjective The patient was seen and examined at the bedside this morning.  She states that she is feeling much better and the pain is significantly decreased in her right hand.  She does note chronic decree sensation of the right hand due to her surgery years ago.  We did discuss possibility of discharge today and she is agreeable.  All questions and concerns were addressed at this time.  Discharge Exam:   BP 111/67 (BP Location: Left Arm)   Pulse 80   Temp 98.2 F (36.8 C) (Oral)   Resp 17   Ht 5' 3 (1.6 m)   Wt 77.1 kg   SpO2 98%   BMI 30.11 kg/m  Discharge exam:  Const: Awake, alert in NAD HENT: Normocephalic, atraumatic, mucus membranes moist Card: RRR, No MRG, No pitting edema on LE's bilaterally  Resp: LCTAB, no increased work of breathing Abd: Soft, NTND Extremities: Warm, pink. Post-operative bandage in place on the RUE. C/D/I. Pt moving all fingers voluntarily    Pertinent Labs, Studies, and Procedures:     Latest Ref Rng & Units 10/07/2023    6:45 AM 10/06/2023   10:36 AM 05/10/2023    8:00 PM  CBC  WBC 4.0 - 10.5 K/uL 12.3  10.4  7.7   Hemoglobin 12.0 - 15.0 g/dL 88.4  87.3  85.9   Hematocrit 36.0 - 46.0 % 34.5  37.6  42.4   Platelets 150 - 400 K/uL 282  323  289        Latest Ref Rng & Units 10/06/2023   10:36 AM 05/10/2023    8:00 PM 04/17/2023    1:37 PM  CMP  Glucose 70 - 99 mg/dL 887  92  89   BUN 6 - 20 mg/dL 7  13  15    Creatinine 0.44 - 1.00 mg/dL 9.23  9.18  9.29    Sodium 135 - 145 mmol/L 134  134  138   Potassium 3.5 - 5.1 mmol/L 3.6  3.8  4.2   Chloride 98 - 111 mmol/L 102  101  107   CO2 22 - 32 mmol/L 20  20  20    Calcium 8.9 - 10.3 mg/dL 8.7  9.3  9.3   Total Protein 6.5 - 8.1 g/dL 7.9  8.8  Total Bilirubin 0.0 - 1.2 mg/dL 0.8  0.5    Alkaline Phos 38 - 126 U/L 81  89    AST 15 - 41 U/L 24  42    ALT 0 - 44 U/L 23  36      DG Hand Complete Right Result Date: 10/06/2023 CLINICAL DATA:  Right hand pain and swelling. EXAM: RIGHT HAND - COMPLETE 3+ VIEW COMPARISON:  January 12, 2023. FINDINGS: No definite fracture or dislocation is noted. Severe osteoarthritis of first carpometacarpal joint is noted. Moderate degenerative changes are seen involving the second and third proximal interphalangeal joints. No definite soft tissue abnormality. IMPRESSION: Severe osteoarthritis of first carpometacarpal joint. Moderate degenerative changes are seen involving the second and third proximal interphalangeal joints. No acute abnormality seen. Electronically Signed   By: Lynwood Landy Raddle M.D.   On: 10/06/2023 10:41     Discharge Instructions: Discharge Instructions     Call MD for:  persistant nausea and vomiting   Complete by: As directed    Call MD for:  redness, tenderness, or signs of infection (pain, swelling, redness, odor or green/yellow discharge around incision site)   Complete by: As directed    Call MD for:  severe uncontrolled pain   Complete by: As directed    Call MD for:  temperature >100.4   Complete by: As directed    Diet - low sodium heart healthy   Complete by: As directed    Discharge instructions   Complete by: As directed    Thank you for allowing us  to be part of your care. You were hospitalized for an infection of your right hand. We treated you with surgery and antibiotics  See the changes in your medications and management of your chronic conditions below:  *For your infection -We have STARTED you on these following  medications:  -linezolid  (an antibiotic)--Take 1 tablet every 12 hours for 10days   -Remember to finish all of your antibiotic even if you are feeling better.   -You may take tylenol  and ibuprofen  as needed for pain.    **Please see the instructions from the surgeon below:  FOLLOW UP APPOINTMENTS: Please visit the surgeon (Dr. Murrell doctor) in 7-14 days. Call them to schedule your appointment: [facility] [place] [phone]  Please make sure to take your antibiotics and make your follow up visits  Please call your PCP or our clinic if you have any questions or concerns, we may be able to help and keep you from a long and expensive emergency room wait. Our clinic and after hours phone number is [phone]. The best time to call is Monday through Friday 9 am to 4 pm but there is always someone available 24/7 if you have an emergency. If you need medication refills please notify your pharmacy one week in advance and they will send us  a request.   We are glad you are feeling better,  [facility] Inpatient Teaching Service at [facility]   Discharge wound care:   Complete by: As directed    Keep your hand elevated above the level of your heart.  Do not allow it to dangle by your side.  Keep the dressing dry and do not remove it unless your doctor advises you to do so.  He will usually change it at the time of your post-op visit.  Moving your fingers is advised to stimulate circulation but will depend on the site of  your surgery.  If you have a splint applied, your doctor will advise you regarding movement.   Increase activity slowly   Complete by: As directed        Signed: Myrna Bitters, DO 10/07/2023, 11:41 AM

## 2023-10-07 NOTE — Evaluation (Signed)
 Occupational Therapy Evaluation Patient Details Name: Alexandra Floyd MRN: [id number] DOB: [birth year] Today's Date: 10/07/2023   History of Present Illness   58 y/o presenting to ED on 9/5 with R hand edema, found to have abscess of R thumb flexor sheath thenar abscess, s/p I & D on 9.5.     PMH includes recurrentl infection, chronic hep B and C, polysubstance use     Clinical Impressions Pt ind at baseline with ADL/functional mobility, lives with boyfriend and stepson. Pt currently with limited use of R hand/wrist from surgery. Currently performing ADLs and mobilizing independently. Pt educated on digit mobilization within limits of splint/ace wrap along with elevation and NWB of R hand to promote healing. Pt presenting with impairments listed below, will follow acutely. Recommend OP OT at d/c once cleared by MD.     If plan is discharge home, recommend the following:   A little help with walking and/or transfers;A little help with bathing/dressing/bathroom     Functional Status Assessment   Patient has had a recent decline in their functional status and demonstrates the ability to make significant improvements in function in a reasonable and predictable amount of time.     Equipment Recommendations   None recommended by OT     Recommendations for Other Services   PT consult     Precautions/Restrictions   Restrictions Weight Bearing Restrictions Per Provider Order: Yes Other Position/Activity Restrictions: no formal orders, adhered to NWB R hand     Mobility Bed Mobility Overal bed mobility: Independent                  Transfers Overall transfer level: Independent                        Balance Overall balance assessment: No apparent balance deficits (not formally assessed)                                         ADL either performed or assessed with clinical judgement   ADL Overall ADL's : At baseline                                        General ADL Comments: performs toileting, grooming task, LB ADL without assist, educated on wrapping cast with plastic bag/plastic wrap for bathing     Vision   Vision Assessment?: No apparent visual deficits     Perception Perception: Not tested       Praxis Praxis: Not tested       Pertinent Vitals/Pain Pain Assessment Pain Assessment: No/denies pain     Extremity/Trunk Assessment Upper Extremity Assessment Upper Extremity Assessment: RUE deficits/detail RUE Deficits / Details: in cast splint from forearm to hand, with digits 1-4 exposed, keeps DIPs in flexed position RUE Sensation: decreased light touch;decreased proprioception RUE Coordination: decreased fine motor;decreased gross motor   Lower Extremity Assessment Lower Extremity Assessment: Overall WFL for tasks assessed   Cervical / Trunk Assessment Cervical / Trunk Assessment: Normal   Communication Communication Communication: No apparent difficulties   Cognition Arousal: Alert Behavior During Therapy: WFL for tasks assessed/performed Cognition: No apparent impairments  Following commands: Intact       Cueing  General Comments   Cueing Techniques: Verbal cues  VSS   Exercises     Shoulder Instructions      Home Living Family/patient expects to be discharged to:: Private residence Living Arrangements: Other (Comment) (stepson and boyfriend) Available Help at Discharge: Family;Available PRN/intermittently Type of Home: House Home Access: Stairs to enter [employer] of Steps: 5 Entrance Stairs-Rails: Can reach both Home Layout: One level     Bathroom Shower/Tub: Walk-in shower         Home Equipment: Agricultural consultant (2 wheels);Cane - single point;Shower seat          Prior Functioning/Environment Prior Level of Function : Independent/Modified Independent             Mobility Comments:  ind ADLs Comments: ind    OT Problem List: Decreased range of motion;Decreased strength;Decreased coordination   OT Treatment/Interventions: Self-care/ADL training;Therapeutic exercise;Energy conservation;DME and/or AE instruction;Therapeutic activities;Balance training;Patient/family education      OT Goals(Current goals can be found in the care plan section)   Acute Rehab OT Goals Patient Stated Goal: to go home OT Goal Formulation: With patient Time For Goal Achievement: 10/21/23 Potential to Achieve Goals: Good   OT Frequency:  Min 2X/week    Co-evaluation              AM-PAC OT 6 Clicks Daily Activity     Outcome Measure Help from another person eating meals?: None Help from another person taking care of personal grooming?: None Help from another person toileting, which includes using toliet, bedpan, or urinal?: A Little Help from another person bathing (including washing, rinsing, drying)?: A Little Help from another person to put on and taking off regular upper body clothing?: A Little Help from another person to put on and taking off regular lower body clothing?: A Little 6 Click Score: 20   End of Session Nurse Communication: Mobility status  Activity Tolerance: Patient tolerated treatment well Patient left: in bed;with call bell/phone within reach;with family/visitor present  OT Visit Diagnosis: Unsteadiness on feet (R26.81);Other abnormalities of gait and mobility (R26.89);Muscle weakness (generalized) (M62.81)                Time: 8840-8789 OT Time Calculation (min): 11 min Charges:  OT General Charges $OT Visit: 1 Visit OT Evaluation $OT Eval Low Complexity: 1 Low  Laneta POUR, OTD, OTR/L SecureChat Preferred Acute Rehab [phone]   Laneta POUR [place] 10/07/2023, 12:28 PM

## 2023-10-07 NOTE — Progress Notes (Signed)
 AVS reviewed with pt.  Son at bedside.  Pt verbalized understanding.  Per pt, she would drive home. MD aware and approved.  Pt not able to leave her care here and no one able to come pick up her car at a later time if she went home by taxi. No narcotics given this shift.  Personal belongings gathered and collected by patient.  Saline lock removed.  This RN went to pick up medications from [facility], upon return pt had left.  Phone numbers on file called including hers and significant others. No calls were picked up. [place] number is listed for her mother. Wrong number. Did not work.  Vernell Rolling, RN returned meds to [facility].

## 2023-10-07 NOTE — Anesthesia Postprocedure Evaluation (Signed)
 Anesthesia Post Note  Patient: Alexandra Floyd  Procedure(s) Performed: IRRIGATION AND DEBRIDEMENT WOUND (Right: Hand)     Patient location during evaluation: Nursing Unit Anesthesia Type: General Level of consciousness: awake and alert Pain management: pain level controlled Vital Signs Assessment: post-procedure vital signs reviewed and stable Respiratory status: spontaneous breathing, nonlabored ventilation and respiratory function stable Cardiovascular status: blood pressure returned to baseline and stable Postop Assessment: no apparent nausea or vomiting Anesthetic complications: no   No notable events documented.  Last Vitals:  Vitals:   10/06/23 2150 10/07/23 0008  BP: 118/77 100/84  Pulse: 78 88  Resp: 18 18  Temp: 37.2 C 36.8 C  SpO2: 95% 95%    Last Pain:  Vitals:   10/06/23 2130  TempSrc:   PainSc: 4                  [place] Andes

## 2023-10-09 ENCOUNTER — Encounter (HOSPITAL_COMMUNITY): Payer: Self-pay | Admitting: Orthopedic Surgery

## 2023-10-11 LAB — AEROBIC/ANAEROBIC CULTURE W GRAM STAIN (SURGICAL/DEEP WOUND)

## 2023-11-09 ENCOUNTER — Ambulatory Visit: Payer: Self-pay | Admitting: Internal Medicine

## 2023-11-09 LAB — FUNGUS CULTURE WITH STAIN

## 2023-11-09 LAB — FUNGUS CULTURE RESULT

## 2023-11-09 LAB — FUNGAL ORGANISM REFLEX

## 2023-11-16 LAB — ACID FAST SMEAR (AFB, MYCOBACTERIA): Acid Fast Smear: NEGATIVE

## 2023-11-27 LAB — ACID FAST CULTURE WITH REFLEXED SENSITIVITIES (MYCOBACTERIA): Acid Fast Culture: NEGATIVE
